# Patient Record
Sex: Female | Born: 1940 | Race: Asian | Hispanic: No | Marital: Married | Smoking: Never smoker
Health system: Southern US, Community
[De-identification: ages and names within clinical notes are randomized; demographics above are authoritative.]

## PROBLEM LIST (undated history)

## (undated) DIAGNOSIS — I2510 Atherosclerotic heart disease of native coronary artery without angina pectoris: Secondary | ICD-10-CM

## (undated) DIAGNOSIS — H3530 Unspecified macular degeneration: Secondary | ICD-10-CM

## (undated) DIAGNOSIS — M1990 Unspecified osteoarthritis, unspecified site: Secondary | ICD-10-CM

## (undated) DIAGNOSIS — E785 Hyperlipidemia, unspecified: Secondary | ICD-10-CM

## (undated) DIAGNOSIS — K559 Vascular disorder of intestine, unspecified: Secondary | ICD-10-CM

## (undated) DIAGNOSIS — I639 Cerebral infarction, unspecified: Secondary | ICD-10-CM

## (undated) DIAGNOSIS — E039 Hypothyroidism, unspecified: Secondary | ICD-10-CM

## (undated) DIAGNOSIS — I6529 Occlusion and stenosis of unspecified carotid artery: Secondary | ICD-10-CM

## (undated) HISTORY — DX: Occlusion and stenosis of unspecified carotid artery: I65.29

## (undated) HISTORY — PX: CORONARY ANGIOPLASTY: SHX604

## (undated) HISTORY — PX: CATARACT EXTRACTION: SUR2

## (undated) HISTORY — DX: Atherosclerotic heart disease of native coronary artery without angina pectoris: I25.10

## (undated) HISTORY — DX: Hyperlipidemia, unspecified: E78.5

## (undated) HISTORY — PX: CORONARY STENT PLACEMENT: SHX1402

---

## 1998-01-08 ENCOUNTER — Ambulatory Visit (HOSPITAL_COMMUNITY): Admission: RE | Admit: 1998-01-08 | Discharge: 1998-01-08 | Payer: Self-pay | Admitting: *Deleted

## 2005-03-20 ENCOUNTER — Encounter: Admission: RE | Admit: 2005-03-20 | Discharge: 2005-03-20 | Payer: Self-pay | Admitting: Internal Medicine

## 2005-03-30 ENCOUNTER — Ambulatory Visit (HOSPITAL_COMMUNITY): Admission: RE | Admit: 2005-03-30 | Discharge: 2005-03-31 | Payer: Self-pay | Admitting: Cardiology

## 2005-04-14 ENCOUNTER — Ambulatory Visit (HOSPITAL_COMMUNITY): Admission: RE | Admit: 2005-04-14 | Discharge: 2005-04-15 | Payer: Self-pay | Admitting: Cardiology

## 2005-11-10 ENCOUNTER — Ambulatory Visit (HOSPITAL_COMMUNITY): Admission: RE | Admit: 2005-11-10 | Discharge: 2005-11-10 | Payer: Self-pay | Admitting: *Deleted

## 2006-04-05 ENCOUNTER — Encounter: Admission: RE | Admit: 2006-04-05 | Discharge: 2006-04-05 | Payer: Self-pay | Admitting: Internal Medicine

## 2006-04-15 ENCOUNTER — Encounter: Admission: RE | Admit: 2006-04-15 | Discharge: 2006-04-15 | Payer: Self-pay | Admitting: Internal Medicine

## 2006-08-05 ENCOUNTER — Encounter: Admission: RE | Admit: 2006-08-05 | Discharge: 2006-08-05 | Payer: Self-pay | Admitting: Internal Medicine

## 2006-10-25 ENCOUNTER — Ambulatory Visit: Payer: Self-pay | Admitting: Vascular Surgery

## 2007-06-02 ENCOUNTER — Encounter: Admission: RE | Admit: 2007-06-02 | Discharge: 2007-06-02 | Payer: Self-pay | Admitting: Internal Medicine

## 2007-06-29 ENCOUNTER — Encounter: Admission: RE | Admit: 2007-06-29 | Discharge: 2007-06-29 | Payer: Self-pay | Admitting: Internal Medicine

## 2007-11-11 ENCOUNTER — Ambulatory Visit: Payer: Self-pay | Admitting: Vascular Surgery

## 2008-06-15 ENCOUNTER — Encounter: Admission: RE | Admit: 2008-06-15 | Discharge: 2008-06-15 | Payer: Self-pay | Admitting: Internal Medicine

## 2008-06-28 ENCOUNTER — Encounter: Admission: RE | Admit: 2008-06-28 | Discharge: 2008-06-28 | Payer: Self-pay | Admitting: Cardiology

## 2008-07-03 ENCOUNTER — Inpatient Hospital Stay (HOSPITAL_COMMUNITY): Admission: RE | Admit: 2008-07-03 | Discharge: 2008-07-04 | Payer: Self-pay | Admitting: Cardiology

## 2008-07-03 HISTORY — PX: FEMORAL ARTERY REPAIR: SHX1582

## 2008-11-02 ENCOUNTER — Ambulatory Visit: Payer: Self-pay | Admitting: Vascular Surgery

## 2009-06-27 ENCOUNTER — Encounter: Admission: RE | Admit: 2009-06-27 | Discharge: 2009-06-27 | Payer: Self-pay | Admitting: Internal Medicine

## 2009-10-26 IMAGING — CR DG CHEST 2V
2 series · 2 of 2 positions shown · non-contrast
Comparison: Chest x-ray of 03/30/2005

CLINICAL DATA: Chest pain, pre-procedure

CHEST - 2 VIEW

[w chest pa]
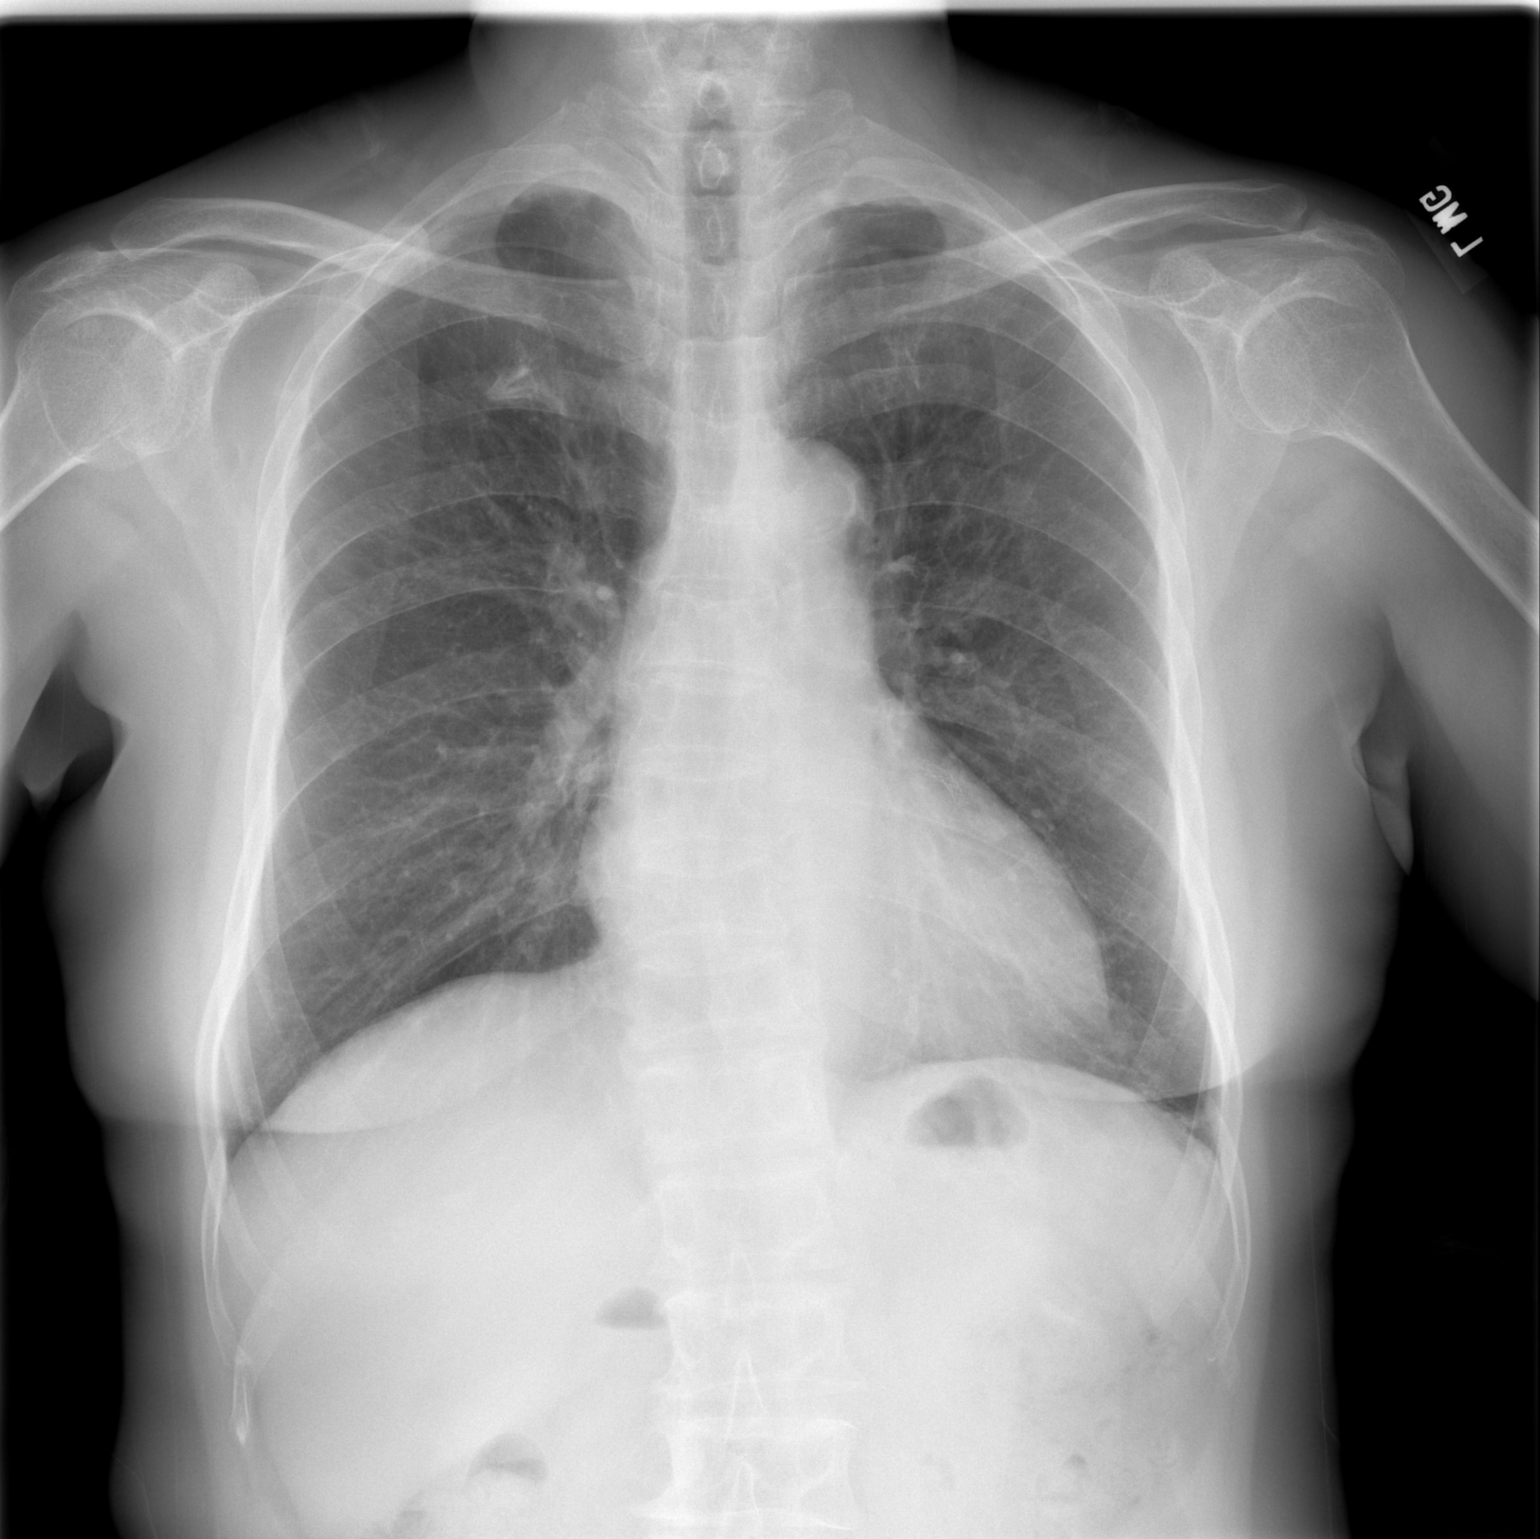

[w chest lat]
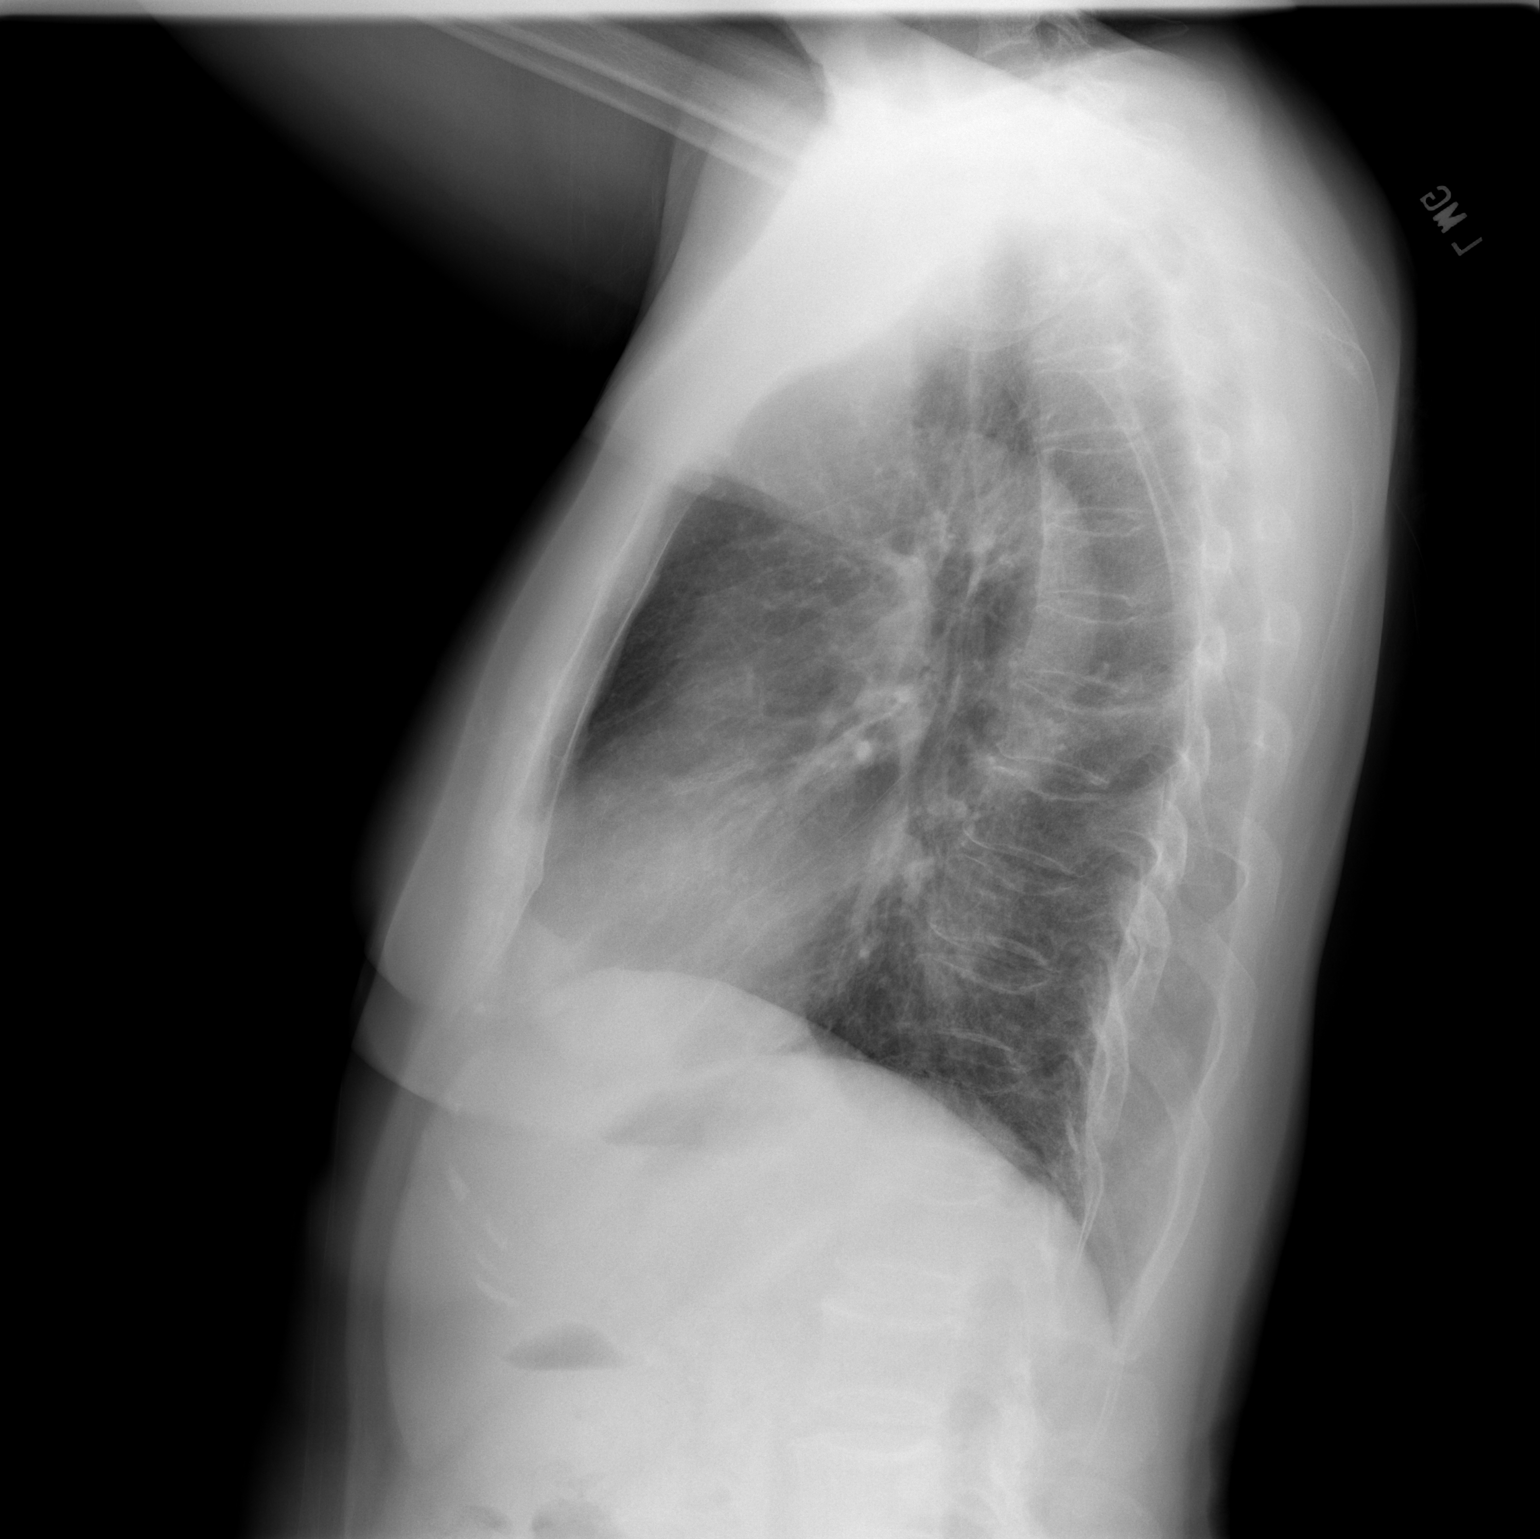

[2 of 2 positions shown; findings below may reference images not displayed]

FINDINGS: The lungs are clear. The heart is within upper limits of
normal.  The bones are somewhat osteopenic.
IMPRESSION: No active lung disease. Stable chest x-ray.,

## 2009-12-26 ENCOUNTER — Encounter: Payer: Self-pay | Admitting: Cardiovascular Disease

## 2010-01-31 ENCOUNTER — Ambulatory Visit: Payer: Self-pay | Admitting: Vascular Surgery

## 2010-03-10 ENCOUNTER — Ambulatory Visit: Payer: Self-pay | Admitting: Cardiovascular Disease

## 2010-03-10 DIAGNOSIS — I2510 Atherosclerotic heart disease of native coronary artery without angina pectoris: Secondary | ICD-10-CM

## 2010-03-10 DIAGNOSIS — E7800 Pure hypercholesterolemia, unspecified: Secondary | ICD-10-CM | POA: Insufficient documentation

## 2010-03-20 DIAGNOSIS — I10 Essential (primary) hypertension: Secondary | ICD-10-CM | POA: Insufficient documentation

## 2010-04-25 ENCOUNTER — Encounter: Payer: Self-pay | Admitting: Cardiovascular Disease

## 2010-07-02 ENCOUNTER — Encounter: Admission: RE | Admit: 2010-07-02 | Discharge: 2010-07-02 | Payer: Self-pay | Admitting: Internal Medicine

## 2010-09-16 NOTE — Assessment & Plan Note (Signed)
Summary: NP6- HTN/PVD   Visit Type:  Initial Consult Primary Provider:  Juline Patch  CC:  HTN- PVD.  History of Present Illness: 70 year-old [place] woman with CAD, previously followed by Dr Jacinto Halim, presents for initial evaluation of her chronic coronary disease.   She has been treated with PCI of the LAD and RCA in the past, most recently in 2009 when she underwent scoring balloon angioplasty of in-stent restenosis in the RCA.  She is doing well at present without complaints. She exercises about 60 minutes daily and has no exertional symptoms.  She specifically denies chest pain, dyspnea, edema, palpitations, lightheadedness, or syncope. Whe reports compliance with her current medical program.    Current Medications (verified): 1)  Enalapril Maleate 20 Mg Tabs (Enalapril Maleate) .... Take One Tablet By Mouth Twice A Day 2)  Lipitor 20 Mg Tabs (Atorvastatin Calcium) .... Take One Tablet By Mouth Daily. 3)  Plavix 75 Mg Tabs (Clopidogrel Bisulfate) .... Take One Tablet By Mouth Daily 4)  Metoprolol Succinate 50 Mg Xr24h-Tab (Metoprolol Succinate) .... Take One Tablet By Mouth Daily 5)  Amlodipine Besylate 5 Mg Tabs (Amlodipine Besylate) .... Take One Tablet By Mouth Daily 6)  Aspirin 81 Mg Tbec (Aspirin) .... Take 1 Tablet By Mouth Two Times A Day 7)  Multivitamins  Tabs (Multiple Vitamin) .... Take 1 Tablet By Mouth Once A Day 8)  Vitamin C 1000 Mg Tabs (Ascorbic Acid) .... Take 1 Tablet By Mouth Once A Day 9)  Fish Oil 1000 Mg Caps (Omega-3 Fatty Acids) .... Take 1 Capsule By Mouth Once A Day 10)  Vitamin D 1000 Unit Tabs (Cholecalciferol) .... Take 1 Tablet By Mouth Once A Day 11)  Niacin 500 Mg Tabs (Niacin) .... Take 1 Tablet By Mouth Once A Day 12)  Plavix 75 Mg Tabs (Clopidogrel Bisulfate) .... Take One Tablet By Mouth Daily  Allergies: 1)  ! Penicillin 2)  ! Sulfa  Past History:  Past medical history reviewed for relevance to current acute and chronic problems.  Past  Medical History: CAD s/p PCI - LAD and RCA with drug-eluting stents, PTCA in 2009 to treat in-stent restenosis in the RCA Essential HTN Hyperlipidemia Asymptomatic carotid stenosis  Past Surgical History: None  Family History: No premature CAD in family members. Both parents deceased at age 31, 4 siblings alive without coronary events.  Social History: Married, originally from [place] Nonsmoker No Etoh Regular exercise  Review of Systems       Negative except as per HPI   Vital Signs:  Patient profile:   70 year old female Height:      63 inches Weight:      140 pounds BMI:     24.89 Pulse rate:   58 / minute Pulse rhythm:   regular Resp:     18 per minute BP sitting:   140 / 80  (left arm) Cuff size:   large  Vitals Entered By: Vikki Ports (March 10, 2010 9:17 AM)  Serial Vital Signs/Assessments:  Time      Position  BP       Pulse  Resp  Temp     By           R Arm     138/70                         Vikki Ports   Physical Exam  General:  Pt is well-developed, alert and oriented, no acute  distress HEENT: normal Neck: no thyromegaly           JVP normal, carotid upstrokes normal without bruits Lungs: CTA Chest: equal expansion  CV: Apical impulse nondisplaced, RRR without murmur or gallop Abd: soft, NT, positive BS, no HSM, no bruit Back: no CVA tenderness Ext: no clubbing, cyanosis, or edema        femoral pulses 2+ without bruits        pedal pulses 2+ and equal Skin: warm, dry, no rash Neuro: CNII-XII intact,strength 5/5 = b/l    EKG  Procedure date:  03/10/2010  Findings:      Sinus bradycardia 58 bpm, early repolarization, LVH.  Impression & Recommendations:  Problem # 1:  CORONARY ATHEROSCLEROSIS NATIVE CORONARY ARTERY (ICD-414.01) The pt appears to be asymptomatic from a cardiovascular perspective with good control of her risk factors under the care of Dr Selena Batten. She has had angina in the past associated with her obstructive CAD and is free  of such symptoms at present. Recommend continue current medical program without changes. I would favor long-term dual dual antiplatelet Rx in the setting of first-generation drug-eluting stents in both the LAD and RCA with hx of in-stent restenosis requiring treatment.    Her updated medication list for this problem includes:    Enalapril Maleate 20 Mg Tabs (Enalapril maleate) .Marland Kitchen... Take one tablet by mouth twice a day    Plavix 75 Mg Tabs (Clopidogrel bisulfate) .Marland Kitchen... Take one tablet by mouth daily    Metoprolol Succinate 50 Mg Xr24h-tab (Metoprolol succinate) .Marland Kitchen... Take one tablet by mouth daily    Amlodipine Besylate 5 Mg Tabs (Amlodipine besylate) .Marland Kitchen... Take one tablet by mouth daily    Aspirin 81 Mg Tbec (Aspirin) .Marland Kitchen... Take 1 tablet by mouth two times a day  Orders: EKG w/ Interpretation (93000)  Problem # 2:  HYPERCHOLESTEROLEMIA, MIXED (ICD-272.0) Lipids reviewed from 12/23/09 showing chol 137, trig 171, HDL 30, and LDL 73. Continue current Rx. LFT's were normal.  Her updated medication list for this problem includes:    Lipitor 20 Mg Tabs (Atorvastatin calcium) .Marland Kitchen... Take one tablet by mouth daily.    Niacin 500 Mg Tabs (Niacin) .Marland Kitchen... Take 1 tablet by mouth once a day  Problem # 3:  HYPERTENSION, BENIGN (ICD-401.1)  Controlled.   Her updated medication list for this problem includes:    Enalapril Maleate 20 Mg Tabs (Enalapril maleate) .Marland Kitchen... Take one tablet by mouth twice a day    Metoprolol Succinate 50 Mg Xr24h-tab (Metoprolol succinate) .Marland Kitchen... Take one tablet by mouth daily    Amlodipine Besylate 5 Mg Tabs (Amlodipine besylate) .Marland Kitchen... Take one tablet by mouth daily    Aspirin 81 Mg Tbec (Aspirin) .Marland Kitchen... Take 1 tablet by mouth two times a day  BP today: 140/80  Patient Instructions: 1)  Your physician recommends that you continue on your current medications as directed. Please refer to the Current Medication list given to you today. 2)  Your physician wants you to follow-up in:  1 year.  You will receive a reminder letter in the mail two months in advance. If you don't receive a letter, please call our office to schedule the follow-up appointment.

## 2010-09-16 NOTE — Consult Note (Signed)
Summary: [facility] Imaging Referral [facility] Imaging Referral Packet   Imported By: Roderic Ovens 03/25/2010 14:14:50  _____________________________________________________________________  External Attachment:    Type:   Image     Comment:   External Document

## 2010-12-30 NOTE — Procedures (Signed)
CAROTID DUPLEX EXAM   INDICATION:  Followup carotid artery disease.   HISTORY:  Diabetes:  No.  Cardiac:  Stents x2.  Hypertension:  Yes.  Smoking:  No.  Previous Surgery:  No.  CV History:  No.  Amaurosis Fugax No, Paresthesias No, Hemiparesis No                                       RIGHT               LEFT  Brachial systolic pressure:         132                 140  Brachial Doppler waveforms:         WNL                 WNL  Vertebral direction of flow:        Antegrade           Antegrade  DUPLEX VELOCITIES (cm/sec)  CCA peak systolic                   90                  87  ECA peak systolic                   181                 151  ICA peak systolic                   192                 104  ICA end diastolic                   56                  40  PLAQUE MORPHOLOGY:                  Calcified/mixed     Calcified/mixed  PLAQUE AMOUNT:                      Moderate            Mild  PLAQUE LOCATION:                    ICA/ECA/bifurcation  ICA/ECA/bifurcation   IMPRESSION:  1. Right ICA shows evidence of 40-59% stenosis (top end of range).  2. Left ICA shows evidence of 20-39% stenosis.  3. Bilateral ECA stenosis (R>L).  4. No significant changes from previous study.   ___________________________________________  Larina Earthly, M.D.   AS/MEDQ  D:  11/02/2008  T:  11/02/2008  Job:  [id number]

## 2010-12-30 NOTE — Procedures (Signed)
CAROTID DUPLEX EXAM   INDICATION:  Follow up carotid artery disease.   HISTORY:  Diabetes:  No.  Cardiac:  Stents x2.  Hypertension:  Yes.  Smoking:  No.  Previous Surgery:  No.  CV History:  No.  Amaurosis Fugax No, Paresthesias No, Hemiparesis No.                                       RIGHT             LEFT  Brachial systolic pressure:         180               172  Brachial Doppler waveforms:         WNL               WNL  Vertebral direction of flow:        Antegrade         Antegrade  DUPLEX VELOCITIES (cm/sec)  CCA peak systolic                   108               131  ECA peak systolic                   158               199  ICA peak systolic                   186               132  ICA end diastolic                   34                28  PLAQUE MORPHOLOGY:                  Heterogenous      Heterogenous  PLAQUE AMOUNT:                      Mild              Mild  PLAQUE LOCATION:                    ICA/ECA           ICA/ECA   IMPRESSION:  1. Right internal carotid artery suggests 40% to 59% stenosis (top end      of range).  2. Left internal carotid artery suggests 40% to 59% stenosis (low end      of range).  3. Bilateral external carotid artery stenosis.  4. Antegrade flow in bilateral vertebrals.   ___________________________________________  Larina Earthly, M.D.   CB/MEDQ  D:  01/31/2010  T:  01/31/2010  Job:  [id number]

## 2010-12-30 NOTE — Cardiovascular Report (Signed)
Jennifer Russo, Jennifer Russo NO.:  [id number]   MEDICAL RECORD NO.:  [id number]          PATIENT TYPE:  INP   LOCATION:  6526                         FACILITY:  [facility]   PHYSICIAN:  Cristy Hilts. Jacinto Halim, MD       DATE OF BIRTH:  1941/03/16   DATE OF PROCEDURE:  07/03/2008  DATE OF DISCHARGE:                            CARDIAC CATHETERIZATION   PROCEDURES PERFORMED:  1. Left ventriculography.  2. Selective right and left coronary arteriography.  3. Percutaneous transluminal coronary angioplasty and scoring balloon      angioplasty of the mid right coronary artery in-stent restenosis.   HISTORY:  Ms. Jennifer Russo is a 70 year old Asian female with history of  known coronary disease.  She has undergone stenting to a mid-LAD for non-  ST-elevation myocardial infarction on March 30, 2005, with implantation  of a 3.0-mm x 23-mm Cypher stent in the mid LAD, and she has a stent  jailed 90% diagonal 1 disease.  She has an occluded first obtuse  marginal branch of the circumflex coronary artery, which has both  ipsilateral and contralateral collaterals in 2006.  She also has stent  implantation to the mid RCA, which is a 3.0-mm x 28-mm Cypher stent  placed on April 14, 2005.  She has been having exertional angina  pectoris recently and has inferolateral ST-segment changes in the EKG.  Given this, she was directly brought to the cardiac catheterization lab  to reevaluate her coronary anatomy.   HEMODYNAMIC DATA:  The ventricular pressure was 137/2, with an end-  diastolic pressure of 7 mmHg.  The aortic pressure was 135/58 with a  mean of 88 mmHg.  There was no pressure gradient across the aortic  valve.   ANGIOGRAPHIC DATA:  Left ventricle:  Left ventricular systolic function  was normal with ejection fraction of 60%.  There was no significant  mitral regurgitation.   Right coronary artery:  Right coronary artery is a large-caliber  dominant vessel.  Gives origin to large PLA and  large PDA.  The  previously placed stent shows a focal 99% in-stent restenosis.   Left main coronary artery:  Left main coronary artery is a large-caliber  vessel, it is smooth and normal.   LAD:  LAD is a large caliber vessel.  The previously placed mid-LAD  Cypher stent is widely patent.  The stent jailed diagonal still persist  to have ostial 85-90% stenosis, which is unchanged from 2006.  There was  excellent flow noted through this vessel.  There is mild poststenotic  ectasia.   Ramus intermediate:  Ramus intermediate is a moderate caliber vessel.  It has mild luminal irregularity in the proximal-to-mid segment.   Circumflex coronary artery:  Circumflex coronary artery is a large  caliber vessel.  Gives origin to a large first obtuse marginal, which is  occluded, but has ipsilateral collaterals.  This occlusion is unchanged  from prior cardiac catheterization.   INTERVENTION DATA:  Successful PTCA and scoring balloon angioplasty of  ISR in the mid RCA with utilization of a 3.0-mm x 10 mm AngioSculpt  scoring  balloon with reduction of stenosis from 99% to 0% with brisk  TIMI 3 to TIMI 3 flow maintained at the end of the procedure.   Right femoral arteriography was performed and arterial access was closed  with [employer] with excellent hemostasis.   RECOMMENDATIONS:  The patient will be discharged home in the morning.  She will continue on aspirin and Plavix.   A total of 115 mL of contrast was utilized for diagnostic and  interventional procedure.   TECHNIQUE OF THE PROCEDURE:  Under usual sterile precautions using a 6-  French right femoral artery access, 6-French multipurpose B2 catheter  was advanced into the ascending aorta and then into the left  ventricular, left ventriculography was performed both in LAO and RAO  projection.  Catheter pulled into the ascending aorta.  Right coronary  was selective engaged, angiography was performed, then left main  coronary artery  was selectively engaged and angiography was performed.  Catheter was then pulled out of the body.   Using Angiomax for anticoagulation, a 7-French FR-4 with side-hole guide  catheter was utilized to engage the right coronary artery.  Using [employer], I was able to cross the lesion and place the 3.0-mm x 10-mm  AngioSculpt balloon with difficulty into the mid RCA and multiple  balloon inflations were performed.  Intracoronary 200 mcg of  nitroglycerin was also administered and angiography was performed.  Excellent results were noted.  A total of three inflations were  performed and then the balloon was withdrawn, angiography repeated,  guide wire withdrawn, angiography repeated, guide catheter disengaged  and pulled out of the body.   The patient tolerated the procedure well.  No immediate complication  noted.      Cristy Hilts. Jacinto Halim, MD  Electronically Signed     JRG/MEDQ  D:  07/03/2008  T:  07/04/2008  Job:  [id number]   cc:   Juline Patch, M.D.

## 2010-12-30 NOTE — Discharge Summary (Signed)
Jennifer Russo, MCCLEERY NO.:  [id number]   MEDICAL RECORD NO.:  [id number]          PATIENT TYPE:  INP   LOCATION:  6526                         FACILITY:  [facility]   PHYSICIAN:  Cristy Hilts. Jacinto Halim, MD       DATE OF BIRTH:  [birth year]   DATE OF ADMISSION:  07/03/2008  DATE OF DISCHARGE:  07/04/2008                               DISCHARGE SUMMARY   DISCHARGE DIAGNOSES:  1. Recurrent progressive angina.  2. Previous coronary artery disease.  3. Status post cardiac catheterization this admission with in-stent      restenosis of an right coronary artery stent at 99% with successful      procedure, percutaneous transluminal coronary angioplasty with      angioscope Scoring balloon 3.0 x 10, reduced from 99-0%.  Positive      residual disease with a 100% first obtuse marginal with collaterals      from circumflex and ramus to the first obtuse marginal and 90%      diagonal-1, ostial.  A 3.0 x 23 Cypher stent in her left anterior      descending is patent, placed on March 30, 2005.  4. Ejection fraction of 60%.  5. Hyperlipidemia.  6. Hypertension.  7. History of cerebrovascular accident in 2001 with no residual      effect.   LABORATORY DATA:  Sodium 140, potassium 4.1, BUN 9, creatinine 0.85,  glucose 100.  Hemoglobin 13.4, hematocrit 38.8, WBCs 8.6, and platelets  232.  CK-MB and the troponins were negative.  Chest x-ray on June 28, 2008, showed no active lung disease with stable chest x-ray.   Discharge medications are the same as prior to admission:  1. Enalapril 20 mg twice per day.  2. Lipitor 20 mg daily.  3. Amlodipine 10 mg a day.  4. Plavix 75 mg a day.  5. Aspirin 81 mg a day.  6. Multivitamin 1 everyday.  7. Vitamin C 1000 mg everyday.  8. Fish oil 1000 mg everyday.  9. Vitamin D 1000 International Units everyday.  10.Metoprolol succinate 50 mg everyday.  11.Alendronate 70 mg a week.  12.Calcium plus D 500 mg daily.  13.She was given a prescription  for nitroglycerin spray that she can      use p.r.n. for angina.   HOSPITAL COURSE:  [name] is a 70 year old [place] female who was  referred by Dr. Juline Patch to Dr. Jacinto Halim secondary to angina and change  in her EKG.  She does have a previous history of coronary artery  disease.  She was seen by Dr. Jacinto Halim.  Her blood pressure was rather  elevated.  He increased her amlodipine and discussed with her cardiac  catheterization.  She decided to proceed.  She was brought into the  hospital on July 03, 2008, for cardiac cath.  This revealed a 99% in-  stent restenosis of her RCA stent.  She underwent successful PTCA and  angioscope Scoring balloon reduced from 99-0%.  Postprocedure, she was  doing well.  She walked in the hall the following day and blood pressure  was 105/38, heart rate was 52, respirations 18, temperature was 97.8.  She was considered stable for discharge home.   DISCHARGE INSTRUCTIONS:  She will follow up with Dr. Jacinto Halim on July 25, 2008, at 10:45.  She is to do no strenuous activity, lifting,  pushing, pulling, or extended walking x1 week.  She should not drive for  1 day, and she should be on a low-sodium, heart-healthy diet.      [name], N.P.      Cristy Hilts. Jacinto Halim, MD  Electronically Signed    BB/MEDQ  D:  07/04/2008  T:  07/04/2008  Job:  [id number]   cc:   Juline Patch, M.D.

## 2010-12-30 NOTE — Procedures (Signed)
CAROTID DUPLEX EXAM   INDICATION:  Follow up carotid artery disease.   HISTORY:  Diabetes:  No.  Cardiac:  Stent x2.  Hypertension:  Yes.  Smoking:  No.  Previous Surgery:  No.  CV History:  Asymptomatic.  Amaurosis Fugax No, Paresthesias No, Hemiparesis No                                       RIGHT             LEFT  Brachial systolic pressure:         160               162  Brachial Doppler waveforms:         Triphasic         Triphasic  Vertebral direction of flow:        Antegrade         Antegrade  DUPLEX VELOCITIES (cm/sec)  CCA peak systolic                   102               88  ECA peak systolic                   207               186  ICA peak systolic                   199               95  ICA end diastolic                   50                32  PLAQUE MORPHOLOGY:                  Calcified         Calcified  PLAQUE AMOUNT:                      Moderate          Mild  PLAQUE LOCATION:                    ICA/ECA           ICA/ECA   IMPRESSION:  1. Right ICA stenosis of 40-59% (top end of range), showing no      significant velocity change from previous study.  However, in lower      category than previous study due to criteria difference.  2. Left ICA stenosis of 20-39%.  3. Bilateral ECA stenosis.   ___________________________________________  Larina Earthly, M.D.   AS/MEDQ  D:  11/11/2007  T:  11/11/2007  Job:  [id number]

## 2011-01-02 NOTE — Cardiovascular Report (Signed)
[name], [name] NO.:  [id number]   MEDICAL RECORD NO.:  [id number]          PATIENT TYPE:  OIB   LOCATION:  2899                         FACILITY:  [facility]   PHYSICIAN:  Jennifer Hilts. Jacinto Halim, MD       DATE OF BIRTH:  [birth year]   DATE OF PROCEDURE:  03/30/2005  DATE OF DISCHARGE:                              CARDIAC CATHETERIZATION   REFERRING PHYSICIAN:  Juline Russo, M.D.   PROCEDURE PERFORMED:  1.  Left ventriculography.  2.  Selective coronary angiography.  3.  Percutaneous transluminal coronary angioplasty and stenting of the mid      left anterior descending.   INDICATIONS:  Ms. Jennifer Russo is a 70 year old female with history of known  coronary artery disease by cardiac catheterization in [place] a few months ago  and has had ongoing exertional chest discomfort. She underwent an exercise  treadmill stress test which was strongly positive for myocardial ischemia.  Hence, she was referred to me for diagnostic angiography and possible  angioplasty.   She also has history of stroke in the past.   HEMODYNAMIC DATA:  The left ventricular pressures were 134/9 with an end-  diastolic pressure of 20 mmHg. The aortic pressures were 133/53 with a mean  of 85 mmHg. There was no appreciable gradient across the aortic valve.   ANGIOGRAPHIC DATA:  Left ventricle: Left ventricular systolic function was  normal. The ejection fraction was estimated at 65%. There was no significant  mitral regurgitation.  Right coronary artery: The right coronary artery is a large caliber vessel,  dominant vessel. It give rise to two large PDAs and continues as a PLA  branch.  It has got a calcific focal 70% stenosis in the mid segment, and at  the crux there is a 60% stenosis. Otherwise, the RCA appears smooth.  Left main: The left main is large caliber vessel. It is not patent.  Left anterior descending: The LAD is a large caliber vessel. It gives rise  to a very small first diagonal and  a large second diagonal. After  bifurcation of the second diagonal, the LAD had a 99% complex calcific  stenosis. Prior to this diagonal bifurcation, there was a 80% to 90%  calcific stenosis.  Circumflex: The circumflex is a large caliber vessel. It continues as a  tortuous vessel in its distal segment. However, in the mid segment it gives  rise to a moderate size first obtuse marginal which is completely occluded.  However, it has bridging collaterals and the distal bed is well visualized.   IMPRESSION:  High-grade 99% stenosis of the mid left anterior descending  which is very complex at the bifurcation of a large second diagonal. A 100%  occluded first obtuse marginal which has bridging collaterals and the distal  vessel appears to be at least moderate size. There is a calcific 70% to 80%  mid RCA stenosis and a tandem 60% calcific stenosis in the mid RCA.  Preserved LV systolic function with an ejection fraction greater than 60%.   INTERVENTION DATA:  Successful PTCA and stenting of the  mid LAD with a 2.0 x  23-mm CYPHER deployed at 10 atmospheric pressure. The stenosis was reduced  from 99% to less than 20% with a waist at the tightness lesion D1  bifurcation. This site was post dilated with a  3.0 x 12-mm Quantum at 16  atmospheric pressure. Overall, the stenosis was reduced from 99% to 0% with  TIMI-III to TIMI-III flow maintained at the end of the procedure.   RECOMMENDATIONS:  The patient will be brought back for elective angioplasty  of the right and also possibly at the first obtuse marginal  of the  circumflex coronary artery which is completely occluded. This will be  performed in about two weeks.   The patient will be continued on Plavix for a period of  at least three to  six months and probably indefinitely if she tolerates it. The patient has a  questionable history of increased LFTs with Plavix long-term when she was  started on for remote stroke. Hence the LFTs will  be monitored every two  weeks at least three to four times, then every four weeks for at least six  months. I discussed these plans with Dr. Juline Russo.   A total of 250 cc of contrast was utilized for diagnostic and interventional  procedure.   TECHNIQUE OF PROCEDURE:  Diagnostic catheterization.  Under the usual  sterile precautions, using a 6 French right femoral arterial access, a 6  [place] multipurpose B2 catheter was advanced into the ascending aorta with  0.025 inch J wire. The catheter was gently advanced into the left ventricle.  The left ventricular pressure was monitored.  Hand contrast into left  ventricle was performed in the LAO and RAO projection. The catheter was  flushed with saline, pulled back in the ascending aorta, and pressure  gradient across the aortic valve was monitored. The right coronary artery  was selectively engaged and angiography was performed. In a similar fashion,  the left main coronary artery was selectively engaged and angiography was  performed. Then the catheter was pulled back outside of the body in the  usual fashion.   TECHNIQUE OF INTERVENTION:  A 6-French sheath was changed to a 7-French  sheath. Then using a 7-French FL 3.5 guide, a 190 cm by  0.014 inch ATW  guide wire was utilized and the tip of the wire was carefully positioned in  the second diagonal of the LAD.  Then initially I tried to use an Publishing rights manager to go into the LAD, but I was unable to. Hence, I used a Market researcher. Again, I was unable to cross into the LAD. Then, using Crossit 100, I  was able to go into the LAD and the tip of the wire was carefully positioned  in the distal LAD. Then a 2.5 x 20-mm Voyager balloon was utilized and the  mid LAD angioplasty at 10 atmospheric pressure for 63 seconds was performed.  Because of his severe stenosis and heavy calcification it was decided to proceed with stenting. Hence a 3.0 x 23-mm CYPHER stent was advanced in this  site  and deployed at 10 atmospheric pressure. Because of the waste at the  second diagonal bifurcation, the stent was post dilated at 3.0 x 12-mm  Quantum at 16 atm pressure for 42 seconds. Repeat angiography had excellent  results. The guide wire was placed in the second diagonal branch for  protection during stenting. This easily was removed post stenting. Prior to  balloon dilatation the  wire was re-advanced back into the second diagonal to  again protect the second diagonal which is a large vessel. Overall,  excellent results were noted. The patient tolerated the procedure well. No  immediate complications were noted.      Jennifer Hilts. Jacinto Halim, MD  Electronically Signed     JRG/MEDQ  D:  03/30/2005  T:  03/30/2005  Job:  [id number]   cc:   Jennifer Russo, M.D.  [address]  [place], [place] [address]  Fax: [phone]

## 2011-01-02 NOTE — Discharge Summary (Signed)
Jennifer Russo, DILLIN NO.:  [id number]   MEDICAL RECORD NO.:  [id number]          PATIENT TYPE:  OIB   LOCATION:  6529                         FACILITY:  [facility]   PHYSICIAN:  Cristy Hilts. Jacinto Halim, MD       DATE OF BIRTH:  [birth year]   DATE OF ADMISSION:  04/14/2005  DATE OF DISCHARGE:  04/15/2005                                 DISCHARGE SUMMARY   DISCHARGE DIAGNOSES:  1.  Coronary artery disease, status post prior intervention, in failure this      month.  2.  Hypertension.  3.  Hyperlipidemia.  4.  History of stroke.   HISTORY OF PRESENT ILLNESS:  [name], a patient  of Dr. Vonna Kotyk R. Ganji, who was seen by him in the office on April 07, 2005.  She underwent prior a coronary angiography that revealed multi-vessel  disease and Dr. Jacinto Halim performed a successful angiography to the mid-LAD with  the insertion of a Cypher stent on March 30, 2005.  The patient still had a  residual 100% occluded marginal-I and a 70%-80% mid and 60% calcific  stenosis of the RCA.  She was scheduled for a PCI of the RCA on April 14, 2005.   HOSPITAL COURSE:  The patient was admitted to the short-stay unit for the  procedure and underwent it on the same day.  Coronary angiography was  performed by Dr. Jacinto Halim, and he performed a direct stenting of the RCA with a  reduction of the proximal lesion from 80% to 0%, and the distal lesion from  60%-70% to 0%.  The patient tolerated the procedure well.  He attempted to  cross the obtuse marginal, but was unable to.   The patient was observed in the unit and she did not have any signs of  hematoma or bleeding from the groin and was discharged home in a stable  condition.   LABORATORY DATA:  Post-catheterization CBC revealed white blood cells 9.2,  hemoglobin 12.1, hematocrit 35.3, platelet count 317.  Sodium 140, potassium  3.9, CO2 of 27, chloride 110, glucose 107, BUN 12, creatinine 0.9.   The patient was seen by Dr.  Nanetta Batty the next morning after  catheterization and was found to be stable for discharge home.   DISCHARGE MEDICATIONS:  1.  Verapamil 240 mg daily.  2.  Aspirin 81 mg daily.  3.  Plavix 75 mg daily.  4.  Vasotec 20 mg daily.  5.  Imdur 30 mg daily.  6.  Lopressor 25 mg b.i.d.  7.  Fish oil 1000 mg daily.  8.  Vitamin C 500 mg daily.  9.  Multivitamin daily.  10. Lipitor 40 mg daily.  11. Glaxo study drug daily.   DIET:  A low-fat, low-cholesterol, low-salt diet.   ACTIVITY:  No driving, no lifting of greater than 5 pounds for three days  post-catheterization.  No strenuous activities.   FOLLOWUP:  She is to see Dr. Jacinto Halim in our office on May 01, 2005, at  10 a.m.      Jennifer  Carolyne Russo.      Cristy Hilts. Jacinto Halim, MD  Electronically Signed    MK/MEDQ  D:  04/15/2005  T:  04/15/2005  Job:  [id number]   cc:   [facility]

## 2011-01-02 NOTE — Cardiovascular Report (Signed)
NAMELARETA, BRUNEAU NO.:  [id number]   MEDICAL RECORD NO.:  [id number]          PATIENT TYPE:  OIB   LOCATION:  2857                         FACILITY:  [facility]   PHYSICIAN:  Cristy Hilts. Jacinto Halim, MD       DATE OF BIRTH:  Feb 16, 1941   DATE OF PROCEDURE:  04/14/2005  DATE OF DISCHARGE:                              CARDIAC CATHETERIZATION   PROCEDURE PERFORMED:  1.  Coronary arteriography.  2.  Percutaneous transluminal coronary angioplasty and direct stenting of      the mid right coronary artery with a 3 by 28 mm Cypher.  3.  Attempted percutaneous transluminal coronary angiography of the chronic      totally occluded first obtuse marginal.   INDICATIONS FOR PROCEDURE:  Ms. Jennifer Russo is a 70 year old [place] female  with a history of known coronary artery disease.  She had undergone coronary  angiography in [place] and was found to have two vessel disease.  I had cathed  her on March 30, 2005, with recurrent angina which was stable and, at that  time, she was found to have a 95% high grade complex mid LAD stenosis, 80%  RCA, and a 60-70% RCA stenosis tandemly in the mid segment and in the first  obtuse marginal which is chronically occluded.  She underwent successful  PTCA and stenting of a mid LAD with a 3 by 23 mm Cypher without any undue  complications.  Now, she is brought back for elective staged PCI of the RCA  and possible attempted obtuse marginal PTCA.   ANGIOGRAPHIC DATA:  Please note my angiographic data dated March 30, 2005.   Right coronary artery:  The right coronary artery is dominant and is a large  vessel.  It gives origin to a large PDA and much larger PLA.  The RCA in the  mid segment has a tandem 80% and a 60-70% calcific stenosis.   Left main:  The left main is large caliber vessel and appears normal.   Circumflex:  The circumflex is a large caliber vessel and is very tortuous  in its mid segment.  The mid segment also gives origin to a  moderate size  first obtuse marginal which is chronically occluded and has bridging  collaterals.   Left anterior descending:  The left anterior descending  is a large vessel.  It gives origin to small diagonal one and a large diagonal two.  The mid LAD  stent placed on March 30, 2005, is widely patent.   INTERVENTIONAL DATA:  Successful PTCA and direct stenting of the mid RCA  with a 3 by 28 mm Cypher deployed at 18 atmospheres pressure for one minute.  The stent was post dilated with 3.25 by 20 mm Quantum.  The stenosis was  reduced from 80% stenosis to 0% in the mid RCA and mid to distal RCA 60-70%  stenosis to 0%.  TIMI III flow at the end of the procedure.   Unsuccessful PTCA of the first obtuse marginal.  I was unable to cannulate  in spite of trying with a Cross-It  100 wire and also balloon support.  It  appeared very heavily calcified and is chronically occluded.   RECOMMENDATIONS:  The patient has excellent collaterals to obtuse marginal,  hence, continued medical therapy is advised.  The patient will be continued  on aggressive risk factor modification.  The patient will be discharged  early in the morning.  Her anginal symptoms will be re-evaluated.   A total of 165 mL of contrast was utilized for coronary angiography and  angioplasty.   TECHNIQUES OF PROCEDURE:  In the usual sterile technique, using a 6 French  right femoral artery access, using Angiomax for anticoagulation, a 6 [place]  JR4 guide was utilized to engage the right coronary artery.  A 190 cm by  0.014 ATW guide-wire was utilized to measure the lesion length.  Then, a 3  by 28 mm Cypher stent was advanced into the lesion and the stent was  deployed at peak 18 atmospheric pressure for 60 seconds.  Post stent  deployment, there was residual waste noted in the proximal and mid to distal  segment of the stent, hence, this was dilated with 3.25 by 20 mm Quantum  balloon at 12 atmospheres pressure.  The stenosis  was reduced from 80% to 0%  and 60-70% to 0% in the proximal and mid segment stents.  TIMI III flow was  maintained.  Then, attention was directed to the left system.   A 7 French JL3.5 guide was utilized to engage the left main coronary artery.  Then, using the same ATW guide-wire, I was able to cross into the proximal  segment of the CPO of the obtuse marginal.  However, because of inability to  completely cross the vessel, I buddy wired it with a Cross-It 100.  Then, I  used a 2 by 20 mm Maverick balloon for support and, in spite of this, I was  unable to cannulate the obtuse marginal.  Hence, the procedure was  abandoned.   The patient tolerated the procedure well.  No immediate complications were  noted.      Cristy Hilts. Jacinto Halim, MD  Electronically Signed     JRG/MEDQ  D:  04/14/2005  T:  04/14/2005  Job:  [id number]   cc:   Juline Patch, M.D.  [address]  [place], [place] [address]  Fax: [phone]

## 2011-01-02 NOTE — Op Note (Signed)
[name], Jennifer Russo NO.:  [id number]   MEDICAL RECORD NO.:  [id number]          PATIENT TYPE:  AMB   LOCATION:  ENDO                         FACILITY:  [facility]   PHYSICIAN:  Georgiana Spinner, M.D.    DATE OF BIRTH:  16-Feb-1941   DATE OF PROCEDURE:  11/10/2005  DATE OF DISCHARGE:                                 OPERATIVE REPORT   PROCEDURE:  Colonoscopy.   INDICATIONS:  Colon cancer screening.   ANESTHESIA:  Fentanyl 50 mcg, Versed 4 mg.   PROCEDURE:  With patient mildly sedated in the left lateral decubitus  position, the Olympus videoscopic colonoscope was inserted in the rectum and  with the patient rolled to her back and pressure applied, we reached the  cecum identified by ileocecal valve and appendiceal orifice, both which were  photographed.  Prep was somewhat suboptimal in that there was solid  vegetable material scattered throughout the colon that had to be washed and  suctioned as best we could.  From this point, colonoscope was slowly  withdrawn, taking circumferential views of the colonic mucosa washing and  suctioning as we withdrew, until we reached the rectum which appeared normal  on direct and showed hemorrhoids on retroflexed view.  The endoscope was  straightened and withdrawn.  The patient's vital signs and pulse oximeter  remained stable.  The patient tolerated procedure well without apparent  complication.   FINDINGS:  Internal hemorrhoids, scattered diverticulosis of the colon,  otherwise an unremarkable examination.   PLAN:  Have patient follow up with me as an outpatient.           ______________________________  Georgiana Spinner, M.D.     GMO/MEDQ  D:  11/10/2005  T:  11/11/2005  Job:  [id number]

## 2011-01-19 ENCOUNTER — Other Ambulatory Visit (INDEPENDENT_AMBULATORY_CARE_PROVIDER_SITE_OTHER): Payer: Medicare Other

## 2011-01-19 DIAGNOSIS — I6529 Occlusion and stenosis of unspecified carotid artery: Secondary | ICD-10-CM

## 2011-01-29 NOTE — Procedures (Unsigned)
CAROTID DUPLEX EXAM  INDICATION:  Followup carotid artery disease.  HISTORY: Diabetes:  No. Cardiac:  Stents x2. Hypertension:  Yes. Smoking:  No. Previous Surgery:  No. CV History:  No. Amaurosis Fugax No, Paresthesias No, Hemiparesis No                                      RIGHT             LEFT Brachial systolic pressure:         148               143 Brachial Doppler waveforms:         Normal            Normal Vertebral direction of flow:        Antegrade         Antegrade DUPLEX VELOCITIES (cm/sec) CCA peak systolic                   80                70 ECA peak systolic                   108               173 ICA peak systolic                   158               79 ICA end diastolic                   41                20 PLAQUE MORPHOLOGY:                  Heterogeneous     Heterogeneous PLAQUE AMOUNT:                      Moderate          Mild PLAQUE LOCATION:                    ICA, ECA          ICA, ECA  IMPRESSION: 1. Right internal carotid artery velocity suggests 40%-59% stenosis. 2. Left internal carotid artery velocity suggests 1%-39% stenosis. 3. Bilateral external carotid artery stenosis. 4. Antegrade vertebral arteries bilaterally.  ___________________________________________ Larina Earthly, M.D.  EM/MEDQ  D:  01/19/2011  T:  01/19/2011  Job:  [id number]

## 2011-03-06 ENCOUNTER — Encounter: Payer: Self-pay | Admitting: Cardiovascular Disease

## 2011-03-10 ENCOUNTER — Encounter: Payer: Self-pay | Admitting: Cardiovascular Disease

## 2011-03-10 ENCOUNTER — Ambulatory Visit (INDEPENDENT_AMBULATORY_CARE_PROVIDER_SITE_OTHER): Payer: Medicare Other | Admitting: Cardiovascular Disease

## 2011-03-10 DIAGNOSIS — I10 Essential (primary) hypertension: Secondary | ICD-10-CM

## 2011-03-10 DIAGNOSIS — I2510 Atherosclerotic heart disease of native coronary artery without angina pectoris: Secondary | ICD-10-CM

## 2011-03-10 DIAGNOSIS — E7800 Pure hypercholesterolemia, unspecified: Secondary | ICD-10-CM

## 2011-03-10 NOTE — Patient Instructions (Signed)
Your physician wants you to follow-up in: 12 months with Dr. Excell Seltzer.  You will receive a reminder letter in the mail two months in advance. If you don't receive a letter, please call our office to schedule the follow-up appointment.  Your physician has requested that you have en exercise stress myoview. For further information please visit [email or web address] Please follow instruction sheet, as given.  Please check blood pressure daily and keep record of readings.  Take these readings to your primary doctor when you see him in December.

## 2011-03-10 NOTE — Assessment & Plan Note (Signed)
The patient is on a combination of atorvastatin and Niaspan. Goal LDL is less than 70.

## 2011-03-10 NOTE — Assessment & Plan Note (Signed)
The patient's blood pressure is elevated in the office today. She has not been checking her blood pressure regularly at home, but notes that it is generally well controlled in the mornings and it becomes elevated sometimes in the afternoons. I've asked her to record all blood pressures and bring them in at the next time she sees Dr. Ricki Miller in followup. She is on a combination of amlodipine, metoprolol, and enalapril. She reports an intolerance to higher doses of amlodipine because of leg edema. If blood pressure is consistently elevated would consider addition of a diuretic therapy.

## 2011-03-10 NOTE — Progress Notes (Signed)
HPI:  This is a 70 year old woman presenting for followup evaluation. The patient has coronary artery disease and has undergone multiple PCI procedures. She has a history of drug eluting stent implantation in the LAD and right coronary arteries. The patient underwent followup stress testing demonstrating inferior ischemia and she was found to have in-stent restenosis of the right coronary artery. This was treated with balloon angioplasty. Over the past 12 months she has had no exertional symptoms. She specifically denies chest pain or tightness. She denies dyspnea, edema, orthopnea, or PND.  Outpatient Encounter Prescriptions as of 03/10/2011  Medication Sig Dispense Refill  . amLODipine (NORVASC) 5 MG tablet Take 5 mg by mouth daily.        . Ascorbic Acid (VITAMIN C) 1000 MG tablet Take 1,000 mg by mouth daily.        Marland Kitchen aspirin (ASPIR-81) 81 MG EC tablet Take 162 mg by mouth daily.       Marland Kitchen atorvastatin (LIPITOR) 20 MG tablet Take 20 mg by mouth daily.        . Calcium Carbonate-Vit D-Min 1200-1000 MG-UNIT CHEW Chew 1 capsule by mouth.        . cholecalciferol (VITAMIN D) 1000 UNITS tablet Take 1,000 Units by mouth daily.        . clopidogrel (PLAVIX) 75 MG tablet Take 75 mg by mouth daily.        . enalapril (VASOTEC) 20 MG tablet Take 20 mg by mouth 2 (two) times daily.        . metoprolol (LOPRESSOR) 50 MG tablet Take 50 mg by mouth 2 (two) times daily.        . Multiple Vitamins-Minerals (MULTIVITAL) tablet Take 1 tablet by mouth daily.        . niacin 500 MG tablet Take 500 mg by mouth daily.        . Omega-3 Fatty Acids (FISH OIL) 1000 MG CAPS Take by mouth daily.        Marland Kitchen DISCONTD: metoprolol (TOPROL-XL) 50 MG 24 hr tablet Take 50 mg by mouth daily.          Allergies  Allergen Reactions  . Penicillins   . Sulfonamide Derivatives     Past Medical History  Diagnosis Date  . CAD (coronary artery disease)     s/p PCI- LAD and RCA with drug-eluting stents, PTCA in 2009 to treat in  stent restenosis in RCA  . Essential hypertension   . HLD (hyperlipidemia)   . Asymptomatic carotid artery stenosis     ROS: Negative except as per HPI  BP 159/72  Pulse 58  Ht 5\' 3"  (1.6 m)  Wt 140 lb 12.8 oz (63.866 kg)  BMI 24.94 kg/m2  PHYSICAL EXAM: Pt is alert and oriented, very pleasant woman in NAD HEENT: normal Neck: JVP - normal, carotids 2+= without bruits Lungs: CTA bilaterally CV: RRR without murmur or gallop Abd: soft, NT, Positive BS, no hepatomegaly Ext: no C/C/E, distal pulses intact and equal Skin: warm/dry no rash  EKG: sinus bradycardia 58 beats per minute, LVH, otherwise within normal limits.  ASSESSMENT AND PLAN:

## 2011-03-10 NOTE — Assessment & Plan Note (Addendum)
The patient is stable without angina. However, she has presented with silent ischemia in the past. With a history of multiple PCI procedures I would recommend an exercise Myoview stress study to rule out significant ischemia. Otherwise recommend continuation of her current medical program. Pending the results of her Myoview scan, I would recommend followup in 12 months.

## 2011-03-16 ENCOUNTER — Ambulatory Visit (HOSPITAL_COMMUNITY): Payer: Medicare Other | Attending: Cardiovascular Disease | Admitting: Radiology

## 2011-03-16 DIAGNOSIS — I2510 Atherosclerotic heart disease of native coronary artery without angina pectoris: Secondary | ICD-10-CM

## 2011-03-16 DIAGNOSIS — I10 Essential (primary) hypertension: Secondary | ICD-10-CM

## 2011-03-16 MED ORDER — TECHNETIUM TC 99M TETROFOSMIN IV KIT
10.8000 | PACK | Freq: Once | INTRAVENOUS | Status: AC | PRN
  Administered 2011-03-16: 11 via INTRAVENOUS

## 2011-03-16 MED ORDER — TECHNETIUM TC 99M TETROFOSMIN IV KIT
33.0000 | PACK | Freq: Once | INTRAVENOUS | Status: AC | PRN
  Administered 2011-03-16: 33 via INTRAVENOUS

## 2011-03-16 NOTE — Progress Notes (Signed)
[facility] SITE 3 NUCLEAR MED [address] [place] [address] [phone]  Cardiology Nuclear Med Study  Jennifer Russo is a 70 y.o. female [id number] [birth year]   Nuclear Med Background Indication for Stress Test:  Evaluation for Ischemia, Stent Patency and PTCA Patency History: 11/09 Angioplasty: for in-stent restenosis, 11/09 Heart Catheterization: EF 60% RCA 99% in stent restenosis, 2006 Myocardial Infarction, 2009 Myocardial Perfusion Study: inf. ischemia and 2006 Stents: LAD/RCA Cardiac Risk Factors: Carotid Disease, CVA, Hypertension and Lipids  Symptoms:  DOE and Palpitations   Nuclear Pre-Procedure Caffeine/Decaff Intake:  None NPO After: 7:00pm   Lungs:  clear IV 0.9% NS with Angio Cath:  22g  IV Site: L Hand  IV Started by:  Irean Hong, RN  Chest Size (in):  38 Cup Size: A  Height: 5\' 3"  (1.6 m)  Weight:  137 lb (62.143 kg)  BMI:  Body mass index is 24.27 kg/(m^2). Tech Comments:  Held metoprolol x 36 hrs    Nuclear Med Study 1 or 2 day study: 1 day  Stress Test Type:  Stress  Reading MD: Willa Rough, MD  Order Authorizing Provider:  M.Cooper  Resting Radionuclide: Technetium 71m Tetrofosmin  Resting Radionuclide Dose: 10.8 mCi   Stress Radionuclide:  Technetium 67m Tetrofosmin  Stress Radionuclide Dose: 33.0 mCi           Stress Protocol Rest HR: 60 Stress HR: 133  Rest BP: 146/69 Stress BP: 211/78  Exercise Time (min): 8:00 METS: 10.10   Predicted Max HR: 148 bpm % Max HR: 89.86 bpm Rate Pressure Product: 21308   Dose of Adenosine (mg):  n/a Dose of Lexiscan: n/a mg  Dose of Atropine (mg): n/a Dose of Dobutamine: n/a mcg/kg/min (at max HR)  Stress Test Technologist: Frederick Peers, EMT-P  Nuclear Technologist:  Doyne Keel, CNMT     Rest Procedure:  Myocardial perfusion imaging was performed at rest 45 minutes following the intravenous administration of Technetium 33m Tetrofosmin. Rest ECG: NSR  Stress Procedure:  The  patient exercised for 8:00.  The patient stopped due to fatigue, sob, and denied any chest pain.  There were non specific ST-T wave changes and occ pvcs.  Technetium 53m Tetrofosmin was injected at peak exercise and myocardial perfusion imaging was performed after a brief delay. Stress ECG: ST cahnges are present  QPS Raw Data Images:  Normal; no motion artifact; normal heart/lung ratio. Stress Images:  Normal homogeneous uptake in all areas of the myocardium. Rest Images:  Normal homogeneous uptake in all areas of the myocardium. Subtraction (SDS):  No evidence of ischemia. Transient Ischemic Dilatation (Normal <1.22):  1.00 Lung/Heart Ratio (Normal <0.45):  0.29  Quantitative Gated Spect Images QGS EDV:  70 ml QGS ESV:  23 ml QGS cine images:  Normal Wall Motion QGS EF: 67%  Impression Exercise Capacity:  Fair exercise capacity. BP Response:  Hypertensive blood pressure response. Clinical Symptoms:  SOB ECG Impression:  There is downsloping ST depression with stress. Ischemia can not be ruled out from the EKG. Comparison with Prior Nuclear Study: See below  Overall Impression:  The study is similar to the report of a study done 2010. At that time, the EKG was abnormal, b ut the nuclear normal. Currently, the findings are the same. The EKGs are abnormal. However, the nuclear images are normal.   Willa Rough

## 2011-03-17 NOTE — Progress Notes (Signed)
Nuclear report routed to Dr. Cooper. Jennifer Russo  

## 2011-03-18 ENCOUNTER — Encounter: Payer: Self-pay | Admitting: *Deleted

## 2011-03-22 NOTE — Progress Notes (Signed)
Normal nuclear imaging noted. Abnormal ECG was noted at last stress test as well. Continue current med Rx.

## 2011-03-24 NOTE — Progress Notes (Signed)
Pt.notified

## 2011-05-19 LAB — CARDIAC PANEL(CRET KIN+CKTOT+MB+TROPI)
CK, MB: 1.2
Relative Index: INVALID
Total CK: 54

## 2011-05-19 LAB — BASIC METABOLIC PANEL
BUN: 9
Chloride: 109
Glucose, Bld: 100 — ABNORMAL HIGH
Potassium: 4.1

## 2011-05-19 LAB — CBC
HCT: 38.8
Hemoglobin: 13.4
MCV: 93
Platelets: 232
WBC: 8.6

## 2011-05-27 ENCOUNTER — Other Ambulatory Visit: Payer: Self-pay | Admitting: Internal Medicine

## 2011-05-27 DIAGNOSIS — Z1231 Encounter for screening mammogram for malignant neoplasm of breast: Secondary | ICD-10-CM

## 2011-07-06 ENCOUNTER — Ambulatory Visit
Admission: RE | Admit: 2011-07-06 | Discharge: 2011-07-06 | Disposition: A | Discharge status: Home / Self Care | Payer: Medicare Other | Source: Ambulatory Visit | Attending: Internal Medicine | Admitting: Internal Medicine

## 2011-07-06 DIAGNOSIS — Z1231 Encounter for screening mammogram for malignant neoplasm of breast: Secondary | ICD-10-CM

## 2011-08-06 ENCOUNTER — Other Ambulatory Visit: Payer: Self-pay | Admitting: Internal Medicine

## 2011-08-13 ENCOUNTER — Other Ambulatory Visit: Payer: Medicare Other

## 2011-08-14 ENCOUNTER — Ambulatory Visit
Admission: RE | Admit: 2011-08-14 | Discharge: 2011-08-14 | Disposition: A | Discharge status: Home / Self Care | Payer: Medicare Other | Source: Ambulatory Visit | Attending: Internal Medicine | Admitting: Internal Medicine

## 2011-11-09 ENCOUNTER — Encounter: Payer: Self-pay | Admitting: Cardiovascular Disease

## 2012-01-08 ENCOUNTER — Encounter: Payer: Self-pay | Admitting: Vascular Surgery

## 2012-01-21 ENCOUNTER — Other Ambulatory Visit: Payer: Medicare Other

## 2012-01-21 ENCOUNTER — Ambulatory Visit: Payer: Medicare Other | Admitting: Neurosurgery

## 2012-01-27 ENCOUNTER — Encounter: Payer: Self-pay | Admitting: Neurosurgery

## 2012-01-28 ENCOUNTER — Encounter: Payer: Self-pay | Admitting: Neurosurgery

## 2012-01-28 ENCOUNTER — Ambulatory Visit (INDEPENDENT_AMBULATORY_CARE_PROVIDER_SITE_OTHER): Payer: Medicare Other | Admitting: Vascular Surgery

## 2012-01-28 ENCOUNTER — Ambulatory Visit (INDEPENDENT_AMBULATORY_CARE_PROVIDER_SITE_OTHER): Payer: Medicare Other | Admitting: Neurosurgery

## 2012-01-28 VITALS — BP 148/72 | HR 48 | Resp 14 | Ht 63.0 in | Wt 139.9 lb

## 2012-01-28 DIAGNOSIS — I6529 Occlusion and stenosis of unspecified carotid artery: Secondary | ICD-10-CM

## 2012-01-28 NOTE — Progress Notes (Signed)
[facility] [place] HISTORY AND PHYSICAL   CC: Annual carotid duplex for known stenosis Referring Physician: Early  History of Present Illness: 71 year old female patient of Dr. Arbie Cookey is followed for serial duplex for known stenosis. The patient denies any signs or symptoms of CVA, TIA, amaurosis fugax or any neural deficit. The patient also denies any new medical diagnoses or recent surgeries.  Past Medical History  Diagnosis Date  . CAD (coronary artery disease)     s/p PCI- LAD and RCA with drug-eluting stents, PTCA in 2009 to treat in stent restenosis in RCA  . Essential hypertension   . HLD (hyperlipidemia)   . Asymptomatic carotid artery stenosis     ROS: [x]  Positive   [ ]  Denies    General: [ ]  Weight loss, [ ]  Fever, [ ]  chills Neurologic: [ ]  Dizziness, [ ]  Blackouts, [ ]  Seizure [ ]  Stroke, [ ]  "Mini stroke", [ ]  Slurred speech, [ ]  Temporary blindness; [ ]  weakness in arms or legs, [ ]  Hoarseness Cardiac: [ ]  Chest pain/pressure, [ ]  Shortness of breath at rest [ ]  Shortness of breath with exertion, [ ]  Atrial fibrillation or irregular heartbeat Vascular: [ ]  Pain in legs with walking, [ ]  Pain in legs at rest, [ ]  Pain in legs at night,  [ ]  Non-healing ulcer, [ ]  Blood clot in vein/DVT,   Pulmonary: [ ]  Home oxygen, [ ]  Productive cough, [ ]  Coughing up blood, [ ]  Asthma,  [ ]  Wheezing Musculoskeletal:  [ ]  Arthritis, [ ]  Low back pain, [ ]  Joint pain Hematologic: [ ]  Easy Bruising, [ ]  Anemia; [ ]  Hepatitis Gastrointestinal: [ ]  Blood in stool, [ ]  Gastroesophageal Reflux/heartburn, [ ]  Trouble swallowing Urinary: [ ]  chronic Kidney disease, [ ]  on HD - [ ]  MWF or [ ]  TTHS, [ ]  Burning with urination, [ ]  Difficulty urinating Skin: [ ]  Rashes, [ ]  Wounds Psychological: [ ]  Anxiety, [ ]  Depression   Social History History  Substance Use Topics  . Smoking status: Never Smoker   . Smokeless tobacco: Not on file  . Alcohol Use: No    Family  History Family History  Problem Relation Age of Onset  . Hypertension Mother   . Diabetes Father     Allergies  Allergen Reactions  . Penicillins   . Sulfonamide Derivatives     Current Outpatient Prescriptions  Medication Sig Dispense Refill  . cholecalciferol (VITAMIN D) 1000 UNITS tablet Take 2,000 Units by mouth daily.       Marland Kitchen amLODipine (NORVASC) 5 MG tablet Take 5 mg by mouth daily.        . Ascorbic Acid (VITAMIN C) 1000 MG tablet Take 1,000 mg by mouth daily.        Marland Kitchen aspirin (ASPIR-81) 81 MG EC tablet Take 162 mg by mouth daily.       Marland Kitchen atorvastatin (LIPITOR) 20 MG tablet Take 20 mg by mouth daily.        . Calcium Carbonate-Vit D-Min 1200-1000 MG-UNIT CHEW Chew 1 capsule by mouth.        . clopidogrel (PLAVIX) 75 MG tablet Take 75 mg by mouth daily.        . enalapril (VASOTEC) 20 MG tablet Take 20 mg by mouth 2 (two) times daily.        . metoprolol (LOPRESSOR) 50 MG tablet Take 50 mg by mouth 2 (two) times daily.        Marland Kitchen  Multiple Vitamins-Minerals (MULTIVITAL) tablet Take 1 tablet by mouth daily.        . niacin 500 MG tablet Take 500 mg by mouth daily.        . Omega-3 Fatty Acids (FISH OIL) 1000 MG CAPS Take by mouth daily.          Physical Examination  Filed Vitals:   01/28/12 1146  BP: 148/72  Pulse: 48  Resp:     Body mass index is 24.78 kg/(m^2).  General:  WDWN in NAD Gait: Normal HEENT: WNL Eyes: Pupils equal Pulmonary: normal non-labored breathing , without Rales, rhonchi,  wheezing Cardiac: RRR, without  Murmurs, rubs or gallops; Abdomen: soft, NT, no masses Skin: no rashes, ulcers noted  Vascular Exam Pulses: 2+ radial pulses bilaterally Carotid bruits: Carotid pulses to auscultation no bruits are heard Extremities without ischemic changes, no Gangrene , no cellulitis; no open wounds;  Musculoskeletal: no muscle wasting or atrophy   Neurologic: A&O X 3; Appropriate Affect ; SENSATION: normal; MOTOR FUNCTION:  moving all extremities  equally. Speech is fluent/normal  Non-Invasive Vascular Imaging CAROTID DUPLEX 01/28/2012  Right ICA 40 - 59 % stenosis Left ICA 20 - 39 % stenosis   ASSESSMENT/PLAN: Asymptomatic carotid stenosis that is stable, the patient return in one year for repeat carotid duplex. Her questions were encouraged and answered.  Lauree Chandler ANP   Clinic MD: Darrick Penna

## 2012-01-28 NOTE — Addendum Note (Signed)
Addended by: Sharee Pimple on: 01/28/2012 12:08 PM   Modules accepted: Orders

## 2012-02-04 NOTE — Procedures (Unsigned)
CAROTID DUPLEX EXAM  INDICATION:  Carotid stenosis.  HISTORY: Diabetes:  No Cardiac:  CAD, stent Hypertension:  Yes Smoking:  No Previous Surgery:  No carotid intervention CV History:  Asymptomatic Amaurosis Fugax No, Paresthesias No, Hemiparesis No                                      RIGHT             LEFT Brachial systolic pressure:         158               156 Brachial Doppler waveforms:         WNL               WNL Vertebral direction of flow:        Antegrade         Antegrade DUPLEX VELOCITIES (cm/sec) CCA peak systolic                   102               73 ECA peak systolic                   138               191 ICA peak systolic                   207               108 ICA end diastolic                   42                16 PLAQUE MORPHOLOGY:                  Heterogenous      Heterogenous PLAQUE AMOUNT:                      Moderate          Mild PLAQUE LOCATION:                    CCA/ICA           ECA  IMPRESSION:  Right internal carotid artery stenosis present in the 40% to 59% range. Right external carotid artery appears patent. Left external carotid artery stenosis present. Left internal carotid artery stenosis present in the 1% to 39% range. Bilateral vertebral arteries are patent and antegrade. Essentially unchanged since previous study on 01/19/2011.  ___________________________________________ Larina Earthly, M.D.  SH/MEDQ  D:  01/28/2012  T:  01/28/2012  Job:  [id number]

## 2012-02-08 ENCOUNTER — Encounter: Payer: Self-pay | Admitting: Cardiovascular Disease

## 2012-02-09 ENCOUNTER — Telehealth: Payer: Self-pay | Admitting: Cardiovascular Disease

## 2012-02-09 NOTE — Telephone Encounter (Signed)
Fu msg Pt returning your call 

## 2012-02-09 NOTE — Telephone Encounter (Signed)
I did not call this pt.  The pt came into the office and scheduled her next appointment.

## 2012-02-12 ENCOUNTER — Encounter: Payer: Self-pay | Admitting: Cardiovascular Disease

## 2012-04-06 ENCOUNTER — Encounter: Payer: Self-pay | Admitting: Cardiovascular Disease

## 2012-04-06 ENCOUNTER — Ambulatory Visit (INDEPENDENT_AMBULATORY_CARE_PROVIDER_SITE_OTHER): Payer: Medicare Other | Admitting: Cardiovascular Disease

## 2012-04-06 VITALS — BP 148/57 | HR 54 | Ht 63.0 in | Wt 137.0 lb

## 2012-04-06 DIAGNOSIS — E7800 Pure hypercholesterolemia, unspecified: Secondary | ICD-10-CM

## 2012-04-06 DIAGNOSIS — I2510 Atherosclerotic heart disease of native coronary artery without angina pectoris: Secondary | ICD-10-CM

## 2012-04-06 DIAGNOSIS — I10 Essential (primary) hypertension: Secondary | ICD-10-CM

## 2012-04-06 NOTE — Patient Instructions (Addendum)
Your physician wants you to follow-up in: 1 year with Dr. Cooper.  You will receive a reminder letter in the mail two months in advance. If you don't receive a letter, please call our office to schedule the follow-up appointment.  

## 2012-04-06 NOTE — Progress Notes (Signed)
HPI:  Jennifer Russo returns for followup evaluation. She is a delightful 71 year old woman with coronary artery disease. She's undergone multiple PCI procedures, last in 2009. She has been followed by Dr. Darrick Penna for asymptomatic carotid disease. She presents today for annual cardiology followup. Test, she exercised for 8 minutes without anginal symptoms. There were nonspecific ST and T wave changes. Her nuclear images were normal without evidence of ischemia and her ejection fraction was 67%. The patient had recent labs drawn in Dr. Lynne Logan office and these were reviewed today. Her cholesterol is 134, triglycerides 129, HDL 32, and LDL 76. Renal function is normal with a creatinine of 0.8. Liver function tests were within normal limits.  From a symptomatic perspective she has been doing very well. She swims 4 times per week without exertional symptoms. She specifically denies chest pain or pressure, dyspnea, edema, or palpitations.  Outpatient Encounter Prescriptions as of 04/06/2012  Medication Sig Dispense Refill  . amLODipine (NORVASC) 5 MG tablet Take 5 mg by mouth daily.        . Ascorbic Acid (VITAMIN C) 1000 MG tablet Take 1,000 mg by mouth daily.        Marland Kitchen aspirin (ASPIR-81) 81 MG EC tablet Take 162 mg by mouth daily.       Marland Kitchen atorvastatin (LIPITOR) 20 MG tablet Take 20 mg by mouth daily.        . Biotin 5000 MCG CAPS Take by mouth. daily      . calcium carbonate (CALCIUM 600) 600 MG TABS Take 600 mg by mouth 2 (two) times daily with a meal.      . cholecalciferol (VITAMIN D) 1000 UNITS tablet Take 2,000 Units by mouth daily.       . clopidogrel (PLAVIX) 75 MG tablet Take 75 mg by mouth daily.        . enalapril (VASOTEC) 20 MG tablet Take 20 mg by mouth 2 (two) times daily.        Marland Kitchen levothyroxine (SYNTHROID, LEVOTHROID) 50 MCG tablet Take 50 mcg by mouth daily.      . metoprolol (LOPRESSOR) 50 MG tablet Take 50 mg by mouth 2 (two) times daily.        . Multiple Vitamins-Minerals (MULTIVITAL)  tablet Take 1 tablet by mouth daily.        . niacin 500 MG tablet Take 500 mg by mouth daily.        . Omega-3 Fatty Acids (FISH OIL) 1000 MG CAPS Take by mouth daily.        Marland Kitchen DISCONTD: Calcium Carbonate-Vit D-Min 1200-1000 MG-UNIT CHEW Chew 1 capsule by mouth.          Allergies  Allergen Reactions  . Penicillins   . Sulfonamide Derivatives     Past Medical History  Diagnosis Date  . CAD (coronary artery disease)     s/p PCI- LAD and RCA with drug-eluting stents, PTCA in 2009 to treat in stent restenosis in RCA  . Essential hypertension   . HLD (hyperlipidemia)   . Asymptomatic carotid artery stenosis     ROS: Negative except as per HPI  BP 148/57  Pulse 54  Ht 5\' 3"  (1.6 m)  Wt 62.143 kg (137 lb)  BMI 24.27 kg/m2  PHYSICAL EXAM: Pt is alert and oriented, NAD HEENT: normal Neck: JVP - normal, carotids 2+= with soft bilateral bruits Lungs: CTA bilaterally CV: RRR without murmur or gallop Abd: soft, NT, Positive BS, no hepatomegaly Ext: no C/C/E, distal pulses intact  and equal Skin: warm/dry no rash  EKG:  Sinus bradycardia 55 beats per minute, voltage criteria for LVH, otherwise within normal limits.  ASSESSMENT AND PLAN:

## 2012-04-06 NOTE — Assessment & Plan Note (Signed)
LDL is at goal. HDL is low. She will continue on atorvastatin 20 mg.

## 2012-04-06 NOTE — Assessment & Plan Note (Addendum)
The patient has hypertension on 3 drugs. She has not tolerated higher doses of amlodipine because of leg edema. I recommended the addition of low-dose hydrochlorothiazide. However, she is resistant to adding another medication at this point. Her hypertension control is borderline, primarily with systolic hypertension and blood pressure readings in the 140s. She would like to review with Dr. Ricki Miller before starting hydrochlorothiazide. For now she will continue on a combination of amlodipine, enalapril, and metoprolol.

## 2012-04-06 NOTE — Assessment & Plan Note (Signed)
The patient is stable without anginal symptoms. She is tolerating aspirin and a statin drug. Her last nuclear scan was reviewed and it showed no ischemia. Will followup in one year. She remains on dual antiplatelet therapy with aspirin and Plavix and reports no bleeding problems.

## 2012-05-30 ENCOUNTER — Other Ambulatory Visit: Payer: Self-pay | Admitting: Internal Medicine

## 2012-05-30 DIAGNOSIS — Z1231 Encounter for screening mammogram for malignant neoplasm of breast: Secondary | ICD-10-CM

## 2012-07-06 ENCOUNTER — Ambulatory Visit
Admission: RE | Admit: 2012-07-06 | Discharge: 2012-07-06 | Disposition: A | Discharge status: Home / Self Care | Payer: Medicare Other | Source: Ambulatory Visit | Attending: Internal Medicine | Admitting: Internal Medicine

## 2012-07-06 DIAGNOSIS — Z1231 Encounter for screening mammogram for malignant neoplasm of breast: Secondary | ICD-10-CM

## 2013-01-27 ENCOUNTER — Ambulatory Visit: Payer: Medicare Other | Admitting: Neurosurgery

## 2013-01-27 ENCOUNTER — Other Ambulatory Visit (INDEPENDENT_AMBULATORY_CARE_PROVIDER_SITE_OTHER): Payer: Medicare Other | Admitting: *Deleted

## 2013-01-27 DIAGNOSIS — I6529 Occlusion and stenosis of unspecified carotid artery: Secondary | ICD-10-CM

## 2013-01-30 ENCOUNTER — Other Ambulatory Visit: Payer: Self-pay | Admitting: *Deleted

## 2013-02-01 ENCOUNTER — Encounter: Payer: Self-pay | Admitting: Vascular Surgery

## 2013-04-07 ENCOUNTER — Encounter: Payer: Self-pay | Admitting: Cardiovascular Disease

## 2013-04-07 ENCOUNTER — Ambulatory Visit (INDEPENDENT_AMBULATORY_CARE_PROVIDER_SITE_OTHER): Payer: Medicare Other | Admitting: Cardiovascular Disease

## 2013-04-07 VITALS — BP 120/62 | HR 53 | Ht 63.0 in | Wt 144.0 lb

## 2013-04-07 DIAGNOSIS — E7800 Pure hypercholesterolemia, unspecified: Secondary | ICD-10-CM

## 2013-04-07 DIAGNOSIS — I10 Essential (primary) hypertension: Secondary | ICD-10-CM

## 2013-04-07 DIAGNOSIS — I2510 Atherosclerotic heart disease of native coronary artery without angina pectoris: Secondary | ICD-10-CM

## 2013-04-07 NOTE — Progress Notes (Signed)
HPI:  72 year old woman presenting for followup of coronary artery disease. She has undergone PCI, most recently in 2009.her last nuclear scan was in 2012 and this demonstrated normal myocardial perfusion. Notably she does have an abnormal exercise EKG which has been documented several times in the past.  The patient was having some issues with elevated blood pressure readings but after some changes were made she notes good control recently. She's had no chest pain, chest pressure, dyspnea, or palpitations. She's had mild leg swelling with amlodipine but with the addition of hydrochlorothiazide this is improved.  Outpatient Encounter Prescriptions as of 04/07/2013  Medication Sig Dispense Refill  . amLODipine (NORVASC) 10 MG tablet Take 10 mg by mouth daily.      . Ascorbic Acid (VITAMIN C) 1000 MG tablet Take 1,000 mg by mouth daily.        Marland Kitchen aspirin (ASPIR-81) 81 MG EC tablet Take 162 mg by mouth daily.       Marland Kitchen atorvastatin (LIPITOR) 10 MG tablet Take 10 mg by mouth daily.      . Biotin 5000 MCG CAPS Take by mouth. daily      . calcium carbonate (CALCIUM 600) 600 MG TABS Take 600 mg by mouth 2 (two) times daily with a meal.      . Cholecalciferol (VITAMIN D) 2000 UNITS CAPS Take 2,000 Units by mouth daily.      . clopidogrel (PLAVIX) 75 MG tablet Take 75 mg by mouth daily.        Marland Kitchen levothyroxine (SYNTHROID, LEVOTHROID) 50 MCG tablet Take 50 mcg by mouth daily.      . metoprolol (LOPRESSOR) 50 MG tablet Take 50 mg by mouth 2 (two) times daily.        . Multiple Vitamins-Minerals (MULTIVITAL) tablet Take 1 tablet by mouth daily.        Marland Kitchen olmesartan-hydrochlorothiazide (BENICAR HCT) 40-12.5 MG per tablet Take 1 tablet by mouth daily.      . Omega-3 Fatty Acids (FISH OIL) 1000 MG CAPS Take by mouth daily.        . [DISCONTINUED] amLODipine (NORVASC) 5 MG tablet Take 5 mg by mouth daily.        . [DISCONTINUED] atorvastatin (LIPITOR) 20 MG tablet Take 20 mg by mouth daily.        .  [DISCONTINUED] cholecalciferol (VITAMIN D) 1000 UNITS tablet Take 2,000 Units by mouth daily.       . [DISCONTINUED] enalapril (VASOTEC) 20 MG tablet Take 20 mg by mouth 2 (two) times daily.        . [DISCONTINUED] niacin 500 MG tablet Take 500 mg by mouth daily.         No facility-administered encounter medications on file as of 04/07/2013.    Allergies  Allergen Reactions  . Penicillins   . Sulfonamide Derivatives     Past Medical History  Diagnosis Date  . CAD (coronary artery disease)     s/p PCI- LAD and RCA with drug-eluting stents, PTCA in 2009 to treat in stent restenosis in RCA  . Essential hypertension   . HLD (hyperlipidemia)   . Asymptomatic carotid artery stenosis     ROS: Negative except as per HPI  BP 120/62  Pulse 53  Ht 5\' 3"  (1.6 m)  Wt 144 lb (65.318 kg)  BMI 25.51 kg/m2  SpO2 97%  PHYSICAL EXAM: Pt is alert and oriented, NAD HEENT: normal Neck: JVP - normal, carotids 2+= with soft bilateral bruits Lungs: CTA bilaterally CV:  RRR without murmur or gallop Abd: soft, NT, Positive BS, no hepatomegaly Ext: no C/C/E, distal pulses intact and equal Skin: warm/dry no rash  EKG:  Sinus bradycardia 53 beats per minute, first degree AV block, minimal voltage criteria for LVH maybe normal variant.  ASSESSMENT AND PLAN: 1. Coronary artery disease, native vessel. The patient is stable without anginal symptoms. She's had multivessel stenting and has done well on aspirin and Plavix. We'll continue the same. She's on a beta blocker, ARB, and statin drug. I will see her back in one year.  2. Carotid stenosis. Followed by Dr. Arbie Cookey. Appropriate risk reduction medications are onboard. She has no neurologic symptoms.  3. Hypertension. Blood pressure under ideal control. Dr. Ricki Miller manages this.  4. Hyperlipidemia. She remains on atorvastatin.  Tonny Bollman 04/07/2013 2:33 PM

## 2013-04-07 NOTE — Patient Instructions (Signed)
Your physician wants you to follow-up in: 1 YEAR with Dr Cooper.  You will receive a reminder letter in the mail two months in advance. If you don't receive a letter, please call our office to schedule the follow-up appointment.  Your physician recommends that you continue on your current medications as directed. Please refer to the Current Medication list given to you today.  

## 2013-06-07 ENCOUNTER — Other Ambulatory Visit: Payer: Self-pay

## 2013-06-07 DIAGNOSIS — Z1231 Encounter for screening mammogram for malignant neoplasm of breast: Secondary | ICD-10-CM

## 2013-07-07 ENCOUNTER — Ambulatory Visit
Admission: RE | Admit: 2013-07-07 | Discharge: 2013-07-07 | Disposition: A | Discharge status: Home / Self Care | Payer: Medicare Other | Source: Ambulatory Visit

## 2013-07-07 DIAGNOSIS — Z1231 Encounter for screening mammogram for malignant neoplasm of breast: Secondary | ICD-10-CM

## 2013-08-01 ENCOUNTER — Other Ambulatory Visit: Payer: Self-pay | Admitting: Internal Medicine

## 2013-08-01 DIAGNOSIS — R748 Abnormal levels of other serum enzymes: Secondary | ICD-10-CM

## 2013-08-07 ENCOUNTER — Ambulatory Visit
Admission: RE | Admit: 2013-08-07 | Discharge: 2013-08-07 | Disposition: A | Discharge status: Home / Self Care | Payer: Medicare Other | Source: Ambulatory Visit | Attending: Internal Medicine | Admitting: Internal Medicine

## 2013-08-07 DIAGNOSIS — R748 Abnormal levels of other serum enzymes: Secondary | ICD-10-CM

## 2013-09-27 ENCOUNTER — Other Ambulatory Visit: Payer: Self-pay | Admitting: Vascular Surgery

## 2013-09-27 DIAGNOSIS — I6529 Occlusion and stenosis of unspecified carotid artery: Secondary | ICD-10-CM

## 2013-10-02 ENCOUNTER — Encounter (HOSPITAL_COMMUNITY): Payer: Self-pay | Admitting: Emergency Medicine

## 2013-10-02 ENCOUNTER — Emergency Department (HOSPITAL_COMMUNITY): Payer: Medicare Other

## 2013-10-02 ENCOUNTER — Inpatient Hospital Stay (HOSPITAL_COMMUNITY)
Admission: EM | Admit: 2013-10-02 | Discharge: 2013-10-04 | DRG: 394 | Disposition: A | Discharge status: Home / Self Care | Payer: Medicare Other | Attending: General Surgery | Admitting: General Surgery

## 2013-10-02 DIAGNOSIS — I10 Essential (primary) hypertension: Secondary | ICD-10-CM | POA: Diagnosis present

## 2013-10-02 DIAGNOSIS — E872 Acidosis, unspecified: Secondary | ICD-10-CM | POA: Diagnosis present

## 2013-10-02 DIAGNOSIS — Z880 Allergy status to penicillin: Secondary | ICD-10-CM

## 2013-10-02 DIAGNOSIS — Z882 Allergy status to sulfonamides status: Secondary | ICD-10-CM

## 2013-10-02 DIAGNOSIS — Z833 Family history of diabetes mellitus: Secondary | ICD-10-CM

## 2013-10-02 DIAGNOSIS — I6529 Occlusion and stenosis of unspecified carotid artery: Secondary | ICD-10-CM | POA: Diagnosis present

## 2013-10-02 DIAGNOSIS — I2510 Atherosclerotic heart disease of native coronary artery without angina pectoris: Secondary | ICD-10-CM | POA: Diagnosis present

## 2013-10-02 DIAGNOSIS — K559 Vascular disorder of intestine, unspecified: Principal | ICD-10-CM | POA: Diagnosis present

## 2013-10-02 DIAGNOSIS — D72829 Elevated white blood cell count, unspecified: Secondary | ICD-10-CM | POA: Diagnosis present

## 2013-10-02 DIAGNOSIS — E785 Hyperlipidemia, unspecified: Secondary | ICD-10-CM | POA: Diagnosis present

## 2013-10-02 DIAGNOSIS — Z7902 Long term (current) use of antithrombotics/antiplatelets: Secondary | ICD-10-CM

## 2013-10-02 DIAGNOSIS — Z9861 Coronary angioplasty status: Secondary | ICD-10-CM

## 2013-10-02 DIAGNOSIS — Z79899 Other long term (current) drug therapy: Secondary | ICD-10-CM

## 2013-10-02 DIAGNOSIS — Z7982 Long term (current) use of aspirin: Secondary | ICD-10-CM

## 2013-10-02 DIAGNOSIS — Z8249 Family history of ischemic heart disease and other diseases of the circulatory system: Secondary | ICD-10-CM

## 2013-10-02 LAB — CBC WITH DIFFERENTIAL/PLATELET
Basophils Absolute: 0 10*3/uL (ref 0.0–0.1)
Basophils Relative: 0 % (ref 0–1)
Eosinophils Absolute: 0 10*3/uL (ref 0.0–0.7)
Eosinophils Relative: 0 % (ref 0–5)
HCT: 38.6 % (ref 36.0–46.0)
HEMOGLOBIN: 13.6 g/dL (ref 12.0–15.0)
LYMPHS ABS: 1.7 10*3/uL (ref 0.7–4.0)
LYMPHS PCT: 11 % — AB (ref 12–46)
MCH: 32.5 pg (ref 26.0–34.0)
MCHC: 35.2 g/dL (ref 30.0–36.0)
MCV: 92.3 fL (ref 78.0–100.0)
MONOS PCT: 3 % (ref 3–12)
Monocytes Absolute: 0.5 10*3/uL (ref 0.1–1.0)
NEUTROS PCT: 86 % — AB (ref 43–77)
Neutro Abs: 13.7 10*3/uL — ABNORMAL HIGH (ref 1.7–7.7)
PLATELETS: 287 10*3/uL (ref 150–400)
RBC: 4.18 MIL/uL (ref 3.87–5.11)
RDW: 13.4 % (ref 11.5–15.5)
WBC: 16 10*3/uL — AB (ref 4.0–10.5)

## 2013-10-02 LAB — LIPASE, BLOOD: Lipase: 34 U/L (ref 11–59)

## 2013-10-02 LAB — POCT I-STAT TROPONIN I: Troponin i, poc: 0.01 ng/mL (ref 0.00–0.08)

## 2013-10-02 LAB — COMPREHENSIVE METABOLIC PANEL
ALBUMIN: 3.7 g/dL (ref 3.5–5.2)
ALT: 76 U/L — AB (ref 0–35)
AST: 74 U/L — ABNORMAL HIGH (ref 0–37)
Alkaline Phosphatase: 65 U/L (ref 39–117)
BILIRUBIN TOTAL: 0.3 mg/dL (ref 0.3–1.2)
BUN: 29 mg/dL — ABNORMAL HIGH (ref 6–23)
CO2: 24 meq/L (ref 19–32)
Calcium: 9.4 mg/dL (ref 8.4–10.5)
Chloride: 97 mEq/L (ref 96–112)
Creatinine, Ser: 0.95 mg/dL (ref 0.50–1.10)
GFR calc Af Amer: 66 mL/min — ABNORMAL LOW (ref >90)
GFR, EST NON AFRICAN AMERICAN: 57 mL/min — AB (ref >90)
Glucose, Bld: 160 mg/dL — ABNORMAL HIGH (ref 70–99)
POTASSIUM: 4.4 meq/L (ref 3.7–5.3)
SODIUM: 139 meq/L (ref 137–147)
Total Protein: 7.7 g/dL (ref 6.0–8.3)

## 2013-10-02 MED ORDER — MORPHINE SULFATE 4 MG/ML IJ SOLN
4.0000 mg | Freq: Once | INTRAMUSCULAR | Status: AC
  Administered 2013-10-02: 4 mg via INTRAVENOUS
  Filled 2013-10-02: qty 1

## 2013-10-02 MED ORDER — PANTOPRAZOLE SODIUM 40 MG IV SOLR
40.0000 mg | Freq: Once | INTRAVENOUS | Status: AC
  Administered 2013-10-02: 40 mg via INTRAVENOUS
  Filled 2013-10-02: qty 40

## 2013-10-02 MED ORDER — ONDANSETRON HCL 4 MG/2ML IJ SOLN
4.0000 mg | Freq: Once | INTRAMUSCULAR | Status: AC
  Administered 2013-10-02: 4 mg via INTRAVENOUS
  Filled 2013-10-02: qty 2

## 2013-10-02 MED ORDER — FAMOTIDINE IN NACL 20-0.9 MG/50ML-% IV SOLN
20.0000 mg | Freq: Once | INTRAVENOUS | Status: AC
  Administered 2013-10-02: 20 mg via INTRAVENOUS
  Filled 2013-10-02: qty 50

## 2013-10-02 MED ORDER — IOHEXOL 300 MG/ML  SOLN
20.0000 mL | INTRAMUSCULAR | Status: AC
  Administered 2013-10-02: 20 mL via ORAL

## 2013-10-02 MED ORDER — IOHEXOL 300 MG/ML  SOLN
100.0000 mL | Freq: Once | INTRAMUSCULAR | Status: AC | PRN
  Administered 2013-10-02: 100 mL via INTRAVENOUS

## 2013-10-02 NOTE — ED Provider Notes (Signed)
CSN: [id number]     Arrival date & time 10/02/13  2013 History   First MD Initiated Contact with Patient 10/02/13 2037     Chief Complaint  Patient presents with  . Abdominal Pain     (Consider location/radiation/quality/duration/timing/severity/associated sxs/prior Treatment) HPI History provided by pt and her husband who is translating.  Pt developed diffuse abdominal pain w/ associated vomiting and diarrhea between 3 and 4pm today.  The pain has gradually worsened and she can no longer tolerate.  No relief w/ pepto bismol.  Denies fever, CP, SOB, hematemesis/hematochezia/melena, urinary sx.  No h/o similar sx.  No h/o abdominal surgeries.  No known sick contacts.  Per prior chart, pt recently had elevated transaminases, US abdomen ordered and was unremarkable.  Pt can not give me any more information.   Past Medical History  Diagnosis Date  . CAD (coronary artery disease)     s/p PCI- LAD and RCA with drug-eluting stents, PTCA in 2009 to treat in stent restenosis in RCA  . Essential hypertension   . HLD (hyperlipidemia)   . Asymptomatic carotid artery stenosis    Past Surgical History  Procedure Laterality Date  . Coronary angioplasty  2006   Family History  Problem Relation Age of Onset  . Hypertension Mother   . Diabetes Father    History  Substance Use Topics  . Smoking status: Never Smoker   . Smokeless tobacco: Not on file  . Alcohol Use: No   OB History   Grav Para Term Preterm Abortions TAB SAB Ect Mult Living                 Review of Systems  All other systems reviewed and are negative.      Allergies  Penicillins and Sulfonamide derivatives  Home Medications   Current Outpatient Rx  Name  Route  Sig  Dispense  Refill  . amLODipine (NORVASC) 10 MG tablet   Oral   Take 10 mg by mouth daily.         . Ascorbic Acid (VITAMIN C) 1000 MG tablet   Oral   Take 1,000 mg by mouth daily.           Marland Kitchen. aspirin (ASPIR-81) 81 MG EC tablet   Oral   Take  162 mg by mouth every evening.          . Biotin 5000 MCG CAPS   Oral   Take by mouth. daily         . calcium carbonate (CALCIUM 600) 600 MG TABS   Oral   Take 600 mg by mouth 2 (two) times daily with a meal.         . Cholecalciferol (VITAMIN D) 2000 UNITS CAPS   Oral   Take 2,000 Units by mouth daily.         . clopidogrel (PLAVIX) 75 MG tablet   Oral   Take 75 mg by mouth daily.           Marland Kitchen. levothyroxine (SYNTHROID, LEVOTHROID) 50 MCG tablet   Oral   Take 50 mcg by mouth daily.         . metoprolol (LOPRESSOR) 50 MG tablet   Oral   Take 50 mg by mouth 2 (two) times daily.           . Multiple Vitamins-Minerals (MULTIVITAL) tablet   Oral   Take 1 tablet by mouth daily.           . Omega-3  Fatty Acids (FISH OIL) 1000 MG CAPS   Oral   Take 1,000 mg by mouth daily.          . rosuvastatin (CRESTOR) 10 MG tablet   Oral   Take 10 mg by mouth daily.         Marland Kitchen telmisartan-hydrochlorothiazide (MICARDIS HCT) 80-25 MG per tablet   Oral   Take 1 tablet by mouth daily.          BP 132/44  Pulse 52  Temp(Src) 97.5 F (36.4 C) (Oral)  Resp 18  SpO2 98% Physical Exam  Nursing note and vitals reviewed. Constitutional: She is oriented to person, place, and time. She appears well-developed and well-nourished.  Uncomfortable appearing  HENT:  Head: Normocephalic and atraumatic.  Eyes:  Normal appearance  Neck: Normal range of motion.  Cardiovascular: Normal rate and regular rhythm.   Pulmonary/Chest: Effort normal and breath sounds normal. No respiratory distress.  Abdominal: Soft. She exhibits no distension and no mass. There is no rebound and no guarding.  Obese.  Infrequent bowel sounds.  Diffusely, mildly ttp.  Genitourinary:  No CVA tenderness  Musculoskeletal: Normal range of motion.  Neurological: She is alert and oriented to person, place, and time.  Skin: Skin is warm and dry. No rash noted.  Psychiatric: She has a normal mood and  affect. Her behavior is normal.    ED Course  Procedures (including critical care time) Labs Review Labs Reviewed  CBC WITH DIFFERENTIAL - Abnormal; Notable for the following:    WBC 16.0 (*)    Neutrophils Relative % 86 (*)    Neutro Abs 13.7 (*)    Lymphocytes Relative 11 (*)    All other components within normal limits  COMPREHENSIVE METABOLIC PANEL - Abnormal; Notable for the following:    Glucose, Bld 160 (*)    BUN 29 (*)    AST 74 (*)    ALT 76 (*)    GFR calc non Af Amer 57 (*)    GFR calc Af Amer 66 (*)    All other components within normal limits  CG4 I-STAT (LACTIC ACID) - Abnormal; Notable for the following:    Lactic Acid, Venous 3.36 (*)    All other components within normal limits  POCT I-STAT 3, BLOOD GAS (G3+) - Abnormal; Notable for the following:    pO2, Arterial 62.0 (*)    Bicarbonate 25.6 (*)    All other components within normal limits  LIPASE, BLOOD  BLOOD GAS, ARTERIAL  BASIC METABOLIC PANEL  CBC  LACTIC ACID, PLASMA  CBC  BASIC METABOLIC PANEL  POCT I-STAT TROPONIN I   Imaging Review Ct Abdomen Pelvis W Contrast  10/03/2013   CLINICAL DATA:  Acute onset abdominal pain  EXAM: CT ABDOMEN AND PELVIS WITH CONTRAST  TECHNIQUE: Multidetector CT imaging of the abdomen and pelvis was performed using the standard protocol following bolus administration of intravenous contrast.  CONTRAST:  OMNIPAQUE IOHEXOL 300 MG/ML  SOLN  COMPARISON:  US ABDOMEN COMPLETE dated 08/07/2013  FINDINGS: There is bronchiectasis at the lung bases.  No pericardial fluid.  No focal hepatic lesion. The gallbladder, pancreas, spleen, adrenal glands, and kidneys are normal.  The stomach and proximal small bowel are normal. There is a segment of distal small bowel that is mildly inflamed (image 65, series 2). This loops of small bowel is filled with stool suggesting enteric stasis and fecalization. There is small amount of gas within the mesentery adjacent to these loops of  bowel  inflamed bowel (image 62, series 2). Potential tiny locule of gas within the portal vein (image 23, series 2). More distally the small bowel is collapsed in the terminal ileum is normal. The appendix is normal. The colon rectosigmoid colon are collapsed  Abdominal aorta is normal caliber. There is 6 extensive atherosclerotic disease of the aorta is branches. The celiac trunk and SMA are patent. No retroperitoneal periportal lymphadenopathy.  No free fluid the pelvis. No intraperitoneal free air or abscess. No retroperitoneal periportal lymphadenopathy.  No free fluid the pelvis. The uterus and ovaries are the bladder normal. No aggressive osseous lesion.  IMPRESSION: 1. Segment of distal small bowel with fecalization of enteric contents and mild inflammation of the bowel wall and associated mesentery. Trace amount of gas with mesenteric vessels associated with this inflamed with bowel. Findings are concerning for a segment of ischemic distal small bowel. 2. Appendix is normal. 3. Colon is collapsed. Findings conveyed toCATHERINE Maisa Bedingfield on 10/03/2013  at00:47.   Electronically Signed   By: Genevive Bi M.D.   On: 10/03/2013 00:48    EKG Interpretation   None       MDM   Final diagnoses:  Small bowel ischemia    73yo F w/ CAD, HTN and hyperlipidemia presents w/ diffuse abd pain, N/V/D since this afternoon.  No known sick contacts.  Uncomfortable appearing, afebrile, abd soft/non-distended but diffusely, mildly ttp on exam.  Labs significant for negative troponin as well as leukocytosis; CMP/lipase as well as CT abd/pelvis pending. IV moprhine and zofran ordered for sx.  9:27 PM   Vomiting resolved and pain improved but patient still appears uncomfortable.  IV protonix, pepcid and 4mg  morphine ordered.  There are still labs pending. 10:08 PM   Mildly elevated transaminases and lactate 3.36.  CT findings concerning for distal small bowel ischemia.  All results discussed w/ patient.  Her pain is  persistent but improved and she is otherwise asx.  VSS.  GS consulted for admission and will order her abx. 3:33 AM   Otilio Miu, PA-C 10/03/13 [phone]

## 2013-10-02 NOTE — ED Notes (Signed)
Pt reports she ate [employer] for lunch and appx 2-3 hours after had generalized abdominal pain with emesis x 6 and diarrhea x 2. Pt denies pain on palpation, but reports 8/10 cramping pain. Denies blood in urine/stool/emesis. AO x4.

## 2013-10-02 NOTE — ED Notes (Signed)
Patient with acute onset of abdominal pain three hours after lunch.  Pain is generalized.  Patient also experienced diarrhea with vomiting

## 2013-10-02 NOTE — ED Notes (Signed)
CT made aware that pt finished oral contrast  

## 2013-10-02 NOTE — ED Provider Notes (Signed)
Medical screening examination/treatment/procedure(s) were conducted as a shared visit with non-physician practitioner(s) and myself.  I personally evaluated the patient during the encounter.  EKG Interpretation    Date/Time:  Monday October 02 2013 21:08:51 EST Ventricular Rate:  56 PR Interval:  203 QRS Duration: 83 QT Interval:  488 QTC Calculation: 471 R Axis:   62 Text Interpretation:  Sinus rhythm Left ventricular hypertrophy No significant change since last tracing Confirmed by [facility]  [facility], Lukus Binion ([id number]) on 10/02/2013 9:16:52 PM           MInimal diffuse abdominal tenderness without rebound; CT pending. 2350  Hurman HornJohn M Dayan Kreis, MD 10/04/13 [phone]

## 2013-10-03 DIAGNOSIS — R109 Unspecified abdominal pain: Secondary | ICD-10-CM

## 2013-10-03 DIAGNOSIS — K559 Vascular disorder of intestine, unspecified: Secondary | ICD-10-CM | POA: Diagnosis present

## 2013-10-03 LAB — POCT I-STAT 3, ART BLOOD GAS (G3+)
Acid-Base Excess: 1 mmol/L (ref 0.0–2.0)
BICARBONATE: 25.6 meq/L — AB (ref 20.0–24.0)
O2 Saturation: 93 %
PH ART: 7.421 (ref 7.350–7.450)
PO2 ART: 62 mmHg — AB (ref 80.0–100.0)
Patient temperature: 97.5
TCO2: 27 mmol/L (ref 0–100)
pCO2 arterial: 39.2 mmHg (ref 35.0–45.0)

## 2013-10-03 LAB — BASIC METABOLIC PANEL
BUN: 25 mg/dL — ABNORMAL HIGH (ref 6–23)
CHLORIDE: 101 meq/L (ref 96–112)
CO2: 24 mEq/L (ref 19–32)
Calcium: 8.6 mg/dL (ref 8.4–10.5)
Creatinine, Ser: 0.84 mg/dL (ref 0.50–1.10)
GFR, EST AFRICAN AMERICAN: 77 mL/min — AB (ref >90)
GFR, EST NON AFRICAN AMERICAN: 66 mL/min — AB (ref >90)
Glucose, Bld: 136 mg/dL — ABNORMAL HIGH (ref 70–99)
POTASSIUM: 4.9 meq/L (ref 3.7–5.3)
Sodium: 139 mEq/L (ref 137–147)

## 2013-10-03 LAB — CBC
HCT: 36.9 % (ref 36.0–46.0)
Hemoglobin: 12.8 g/dL (ref 12.0–15.0)
MCH: 32.2 pg (ref 26.0–34.0)
MCHC: 34.7 g/dL (ref 30.0–36.0)
MCV: 92.7 fL (ref 78.0–100.0)
PLATELETS: 229 10*3/uL (ref 150–400)
RBC: 3.98 MIL/uL (ref 3.87–5.11)
RDW: 13.4 % (ref 11.5–15.5)
WBC: 16.1 10*3/uL — ABNORMAL HIGH (ref 4.0–10.5)

## 2013-10-03 LAB — CG4 I-STAT (LACTIC ACID): Lactic Acid, Venous: 3.36 mmol/L — ABNORMAL HIGH (ref 0.5–2.2)

## 2013-10-03 LAB — LACTIC ACID, PLASMA: LACTIC ACID, VENOUS: 2.5 mmol/L — AB (ref 0.5–2.2)

## 2013-10-03 MED ORDER — PANTOPRAZOLE SODIUM 40 MG IV SOLR
40.0000 mg | Freq: Every day | INTRAVENOUS | Status: DC
  Administered 2013-10-03: 40 mg via INTRAVENOUS
  Filled 2013-10-03 (×2): qty 40

## 2013-10-03 MED ORDER — AMLODIPINE BESYLATE 10 MG PO TABS
10.0000 mg | ORAL_TABLET | Freq: Every day | ORAL | Status: DC
  Administered 2013-10-03 – 2013-10-04 (×2): 10 mg via ORAL
  Filled 2013-10-03 (×3): qty 1

## 2013-10-03 MED ORDER — METRONIDAZOLE IN NACL 5-0.79 MG/ML-% IV SOLN
500.0000 mg | Freq: Three times a day (TID) | INTRAVENOUS | Status: DC
  Administered 2013-10-03 – 2013-10-04 (×5): 500 mg via INTRAVENOUS
  Filled 2013-10-03 (×7): qty 100

## 2013-10-03 MED ORDER — ENOXAPARIN SODIUM 40 MG/0.4ML ~~LOC~~ SOLN
40.0000 mg | SUBCUTANEOUS | Status: DC
  Administered 2013-10-03: 40 mg via SUBCUTANEOUS
  Filled 2013-10-03 (×3): qty 0.4

## 2013-10-03 MED ORDER — CIPROFLOXACIN IN D5W 400 MG/200ML IV SOLN
400.0000 mg | Freq: Two times a day (BID) | INTRAVENOUS | Status: DC
  Administered 2013-10-03 – 2013-10-04 (×3): 400 mg via INTRAVENOUS
  Filled 2013-10-03 (×6): qty 200

## 2013-10-03 MED ORDER — ONDANSETRON HCL 4 MG/2ML IJ SOLN: 4.0000 mg | Freq: Four times a day (QID) | INTRAMUSCULAR | Status: DC | PRN

## 2013-10-03 MED ORDER — IRBESARTAN 300 MG PO TABS
300.0000 mg | ORAL_TABLET | Freq: Every day | ORAL | Status: DC
  Administered 2013-10-03 – 2013-10-04 (×2): 300 mg via ORAL
  Filled 2013-10-03 (×2): qty 1

## 2013-10-03 MED ORDER — PIPERACILLIN-TAZOBACTAM 3.375 G IVPB 30 MIN: 3.3750 g | Freq: Once | INTRAVENOUS | Status: DC

## 2013-10-03 MED ORDER — METOPROLOL TARTRATE 50 MG PO TABS
50.0000 mg | ORAL_TABLET | Freq: Two times a day (BID) | ORAL | Status: DC
  Administered 2013-10-03 – 2013-10-04 (×3): 50 mg via ORAL
  Filled 2013-10-03 (×4): qty 1

## 2013-10-03 MED ORDER — SODIUM CHLORIDE 0.9 % IV SOLN
500.0000 mL | Freq: Once | INTRAVENOUS | Status: AC
  Administered 2013-10-03: 03:00:00 via INTRAVENOUS

## 2013-10-03 MED ORDER — TELMISARTAN-HCTZ 80-25 MG PO TABS: 1.0000 | ORAL_TABLET | Freq: Every day | ORAL | Status: DC

## 2013-10-03 MED ORDER — MORPHINE SULFATE 4 MG/ML IJ SOLN
4.0000 mg | Freq: Once | INTRAMUSCULAR | Status: AC
  Administered 2013-10-03: 4 mg via INTRAVENOUS
  Filled 2013-10-03: qty 1

## 2013-10-03 MED ORDER — HYDROMORPHONE HCL PF 1 MG/ML IJ SOLN: 0.5000 mg | INTRAMUSCULAR | Status: DC | PRN

## 2013-10-03 MED ORDER — KCL IN DEXTROSE-NACL 20-5-0.45 MEQ/L-%-% IV SOLN
INTRAVENOUS | Status: DC
  Administered 2013-10-03 – 2013-10-04 (×3): via INTRAVENOUS
  Filled 2013-10-03 (×7): qty 1000

## 2013-10-03 MED ORDER — HYDROCHLOROTHIAZIDE 25 MG PO TABS
25.0000 mg | ORAL_TABLET | Freq: Every day | ORAL | Status: DC
  Administered 2013-10-03 – 2013-10-04 (×2): 25 mg via ORAL
  Filled 2013-10-03 (×2): qty 1

## 2013-10-03 NOTE — ED Notes (Signed)
Returned from CT.  Awaiting results.  Pain unchanged.

## 2013-10-03 NOTE — Progress Notes (Signed)
Patient ID: Jennifer Russo, female   DOB: August 11, 1941, 73 y.o.   MRN: [id number] .    Subjective: Patient feeling better this am  No nausea  Pain essentially gone  Objective: Vital signs in last 24 hours: Temp:  [97.5 F (36.4 C)-98.5 F (36.9 C)] 98.5 F (36.9 C) (02/17 0540) Pulse Rate:  [52-72] 66 (02/17 0540) Resp:  [11-21] 20 (02/17 0540) BP: (98-133)/(40-73) 127/42 mmHg (02/17 0540) SpO2:  [93 %-98 %] 94 % (02/17 0540) Weight:  [140 lb 6.4 oz (63.685 kg)] 140 lb 6.4 oz (63.685 kg) (02/17 0341) Last BM Date: 10/02/13  Intake/Output from previous day: 02/16 0701 - 02/17 0700 In: 452.1 [I.V.:152.1; IV Piggyback:300] Out: -  Intake/Output this shift:    PE: Abd: soft, NT, ND, +Bs Heart: regular, but ? Rub vs murmur  Lab Results:   Recent Labs  10/02/13 2045 10/03/13 0502  WBC 16.0* 16.1*  HGB 13.6 12.8  HCT 38.6 36.9  PLT 287 229   BMET  Recent Labs  10/02/13 2200 10/03/13 0502  NA 139 139  K 4.4 4.9  CL 97 101  CO2 24 24  GLUCOSE 160* 136*  BUN 29* 25*  CREATININE 0.95 0.84  CALCIUM 9.4 8.6   PT/INR No results found for this basename: LABPROT, INR,  in the last 72 hours CMP     Component Value Date/Time   NA 139 10/03/2013 0502   K 4.9 10/03/2013 0502   CL 101 10/03/2013 0502   CO2 24 10/03/2013 0502   GLUCOSE 136* 10/03/2013 0502   BUN 25* 10/03/2013 0502   CREATININE 0.84 10/03/2013 0502   CALCIUM 8.6 10/03/2013 0502   PROT 7.7 10/02/2013 2200   ALBUMIN 3.7 10/02/2013 2200   AST 74* 10/02/2013 2200   ALT 76* 10/02/2013 2200   ALKPHOS 65 10/02/2013 2200   BILITOT 0.3 10/02/2013 2200   GFRNONAA 66* 10/03/2013 0502   GFRAA 77* 10/03/2013 0502   Lipase     Component Value Date/Time   LIPASE 34 10/02/2013 2200       Studies/Results: Ct Abdomen Pelvis W Contrast  10/03/2013   CLINICAL DATA:  Acute onset abdominal pain  EXAM: CT ABDOMEN AND PELVIS WITH CONTRAST  TECHNIQUE: Multidetector CT imaging of the abdomen and pelvis was performed using the  standard protocol following bolus administration of intravenous contrast.  CONTRAST:  OMNIPAQUE IOHEXOL 300 MG/ML  SOLN  COMPARISON:  US ABDOMEN COMPLETE dated 08/07/2013  FINDINGS: There is bronchiectasis at the lung bases.  No pericardial fluid.  No focal hepatic lesion. The gallbladder, pancreas, spleen, adrenal glands, and kidneys are normal.  The stomach and proximal small bowel are normal. There is a segment of distal small bowel that is mildly inflamed (image 65, series 2). This loops of small bowel is filled with stool suggesting enteric stasis and fecalization. There is small amount of gas within the mesentery adjacent to these loops of bowel inflamed bowel (image 62, series 2). Potential tiny locule of gas within the portal vein (image 23, series 2). More distally the small bowel is collapsed in the terminal ileum is normal. The appendix is normal. The colon rectosigmoid colon are collapsed  Abdominal aorta is normal caliber. There is 6 extensive atherosclerotic disease of the aorta is branches. The celiac trunk and SMA are patent. No retroperitoneal periportal lymphadenopathy.  No free fluid the pelvis. No intraperitoneal free air or abscess. No retroperitoneal periportal lymphadenopathy.  No free fluid the pelvis. The uterus and ovaries are the  bladder normal. No aggressive osseous lesion.  IMPRESSION: 1. Segment of distal small bowel with fecalization of enteric contents and mild inflammation of the bowel wall and associated mesentery. Trace amount of gas with mesenteric vessels associated with this inflamed with bowel. Findings are concerning for a segment of ischemic distal small bowel. 2. Appendix is normal. 3. Colon is collapsed. Findings conveyed toCATHERINE SCHINLEVER on 10/03/2013  at00:47.   Electronically Signed   By: Genevive BiStewart  Edmunds M.D.   On: 10/03/2013 00:48    Anti-infectives: Anti-infectives   Start     Dose/Rate Route Frequency Ordered Stop   10/03/13 0215  ciprofloxacin  (CIPRO) IVPB 400 mg     400 mg 200 mL/hr over 60 Minutes Intravenous Every 12 hours 10/03/13 0205     10/03/13 0215  metroNIDAZOLE (FLAGYL) IVPB 500 mg     500 mg 100 mL/hr over 60 Minutes Intravenous Every 8 hours 10/03/13 0205     10/03/13 0100  piperacillin-tazobactam (ZOSYN) IVPB 3.375 g  Status:  Discontinued     3.375 g 100 mL/hr over 30 Minutes Intravenous  Once 10/03/13 0050 10/03/13 0104       Assessment/Plan  1. Abdominal pain, n/v  Plan: 1. Cont NPO for now, if better this afternoon still may consider clear liquids 2. Recheck CBC in am   LOS: 1 day    Anvika Gashi E 10/03/2013, 9:46 AM Pager: [phone]

## 2013-10-03 NOTE — H&P (Signed)
Jennifer Russo is an 73 y.o. female.   Chief Complaint: Lower abdominal pain. HPI: The patient has been having exquisite lower abdominal pain for the past 24 hours. It is currently much better because she is receiving some pain medication in the emergency department, however her CT scan of the abdomen and pelvis demonstrates the visualization of the small bowel was the possibility of mesenteric area and small bowel ischemia.  The patient has never had pain like this before. She entered the emergency department her pain was a 10 out of 10. Currently it is a 4/10 however she has received some pain medication recently.  At home the patient had diarrhea and nausea vomiting. This was intractable for some time. She comes in now for evaluation.  Past Medical History  Diagnosis Date  . CAD (coronary artery disease)     s/p PCI- LAD and RCA with drug-eluting stents, PTCA in 2009 to treat in stent restenosis in RCA  . Essential hypertension   . HLD (hyperlipidemia)   . Asymptomatic carotid artery stenosis     Past Surgical History  Procedure Laterality Date  . Coronary angioplasty  2006    Family History  Problem Relation Age of Onset  . Hypertension Mother   . Diabetes Father    Social History:  reports that she has never smoked. She does not have any smokeless tobacco history on file. She reports that she does not drink alcohol or use illicit drugs.  Allergies:  Allergies  Allergen Reactions  . Penicillins   . Sulfonamide Derivatives      (Not in a hospital admission)  Results for orders placed during the hospital encounter of 10/02/13 (from the past 48 hour(s))  CBC WITH DIFFERENTIAL     Status: Abnormal   Collection Time    10/02/13  8:45 PM      Result Value Ref Range   WBC 16.0 (*) 4.0 - 10.5 K/uL   RBC 4.18  3.87 - 5.11 MIL/uL   Hemoglobin 13.6  12.0 - 15.0 g/dL   HCT 38.6  36.0 - 46.0 %   MCV 92.3  78.0 - 100.0 fL   MCH 32.5  26.0 - 34.0 pg   MCHC 35.2  30.0 - 36.0 g/dL    RDW 13.4  11.5 - 15.5 %   Platelets 287  150 - 400 K/uL   Neutrophils Relative % 86 (*) 43 - 77 %   Neutro Abs 13.7 (*) 1.7 - 7.7 K/uL   Lymphocytes Relative 11 (*) 12 - 46 %   Lymphs Abs 1.7  0.7 - 4.0 K/uL   Monocytes Relative 3  3 - 12 %   Monocytes Absolute 0.5  0.1 - 1.0 K/uL   Eosinophils Relative 0  0 - 5 %   Eosinophils Absolute 0.0  0.0 - 0.7 K/uL   Basophils Relative 0  0 - 1 %   Basophils Absolute 0.0  0.0 - 0.1 K/uL  POCT I-STAT TROPONIN I     Status: None   Collection Time    10/02/13  9:03 PM      Result Value Ref Range   Troponin i, poc 0.01  0.00 - 0.08 ng/mL   Comment 3            Comment: Due to the release kinetics of cTnI,     a negative result within the first hours     of the onset of symptoms does not rule out     myocardial infarction  with certainty.     If myocardial infarction is still suspected,     repeat the test at appropriate intervals.  COMPREHENSIVE METABOLIC PANEL     Status: Abnormal   Collection Time    10/02/13 10:00 PM      Result Value Ref Range   Sodium 139  137 - 147 mEq/L   Potassium 4.4  3.7 - 5.3 mEq/L   Comment: HEMOLYSIS AT THIS LEVEL MAY AFFECT RESULT   Chloride 97  96 - 112 mEq/L   CO2 24  19 - 32 mEq/L   Glucose, Bld 160 (*) 70 - 99 mg/dL   BUN 29 (*) 6 - 23 mg/dL   Creatinine, Ser 0.95  0.50 - 1.10 mg/dL   Calcium 9.4  8.4 - 10.5 mg/dL   Total Protein 7.7  6.0 - 8.3 g/dL   Albumin 3.7  3.5 - 5.2 g/dL   AST 74 (*) 0 - 37 U/L   Comment: HEMOLYSIS AT THIS LEVEL MAY AFFECT RESULT   ALT 76 (*) 0 - 35 U/L   Comment: HEMOLYSIS AT THIS LEVEL MAY AFFECT RESULT   Alkaline Phosphatase 65  39 - 117 U/L   Total Bilirubin 0.3  0.3 - 1.2 mg/dL   GFR calc non Af Amer 57 (*) >90 mL/min   GFR calc Af Amer 66 (*) >90 mL/min   Comment: (NOTE)     The eGFR has been calculated using the CKD EPI equation.     This calculation has not been validated in all clinical situations.     eGFR's persistently <90 mL/min signify possible Chronic  Kidney     Disease.  LIPASE, BLOOD     Status: None   Collection Time    10/02/13 10:00 PM      Result Value Ref Range   Lipase 34  11 - 59 U/L  CG4 I-STAT (LACTIC ACID)     Status: Abnormal   Collection Time    10/03/13  1:26 AM      Result Value Ref Range   Lactic Acid, Venous 3.36 (*) 0.5 - 2.2 mmol/L   Ct Abdomen Pelvis W Contrast  10/03/2013   CLINICAL DATA:  Acute onset abdominal pain  EXAM: CT ABDOMEN AND PELVIS WITH CONTRAST  TECHNIQUE: Multidetector CT imaging of the abdomen and pelvis was performed using the standard protocol following bolus administration of intravenous contrast.  CONTRAST:  154m OMNIPAQUE IOHEXOL 300 MG/ML  SOLN  COMPARISON:  [place] dated 08/07/2013  FINDINGS: There is bronchiectasis at the lung bases.  No pericardial fluid.  No focal hepatic lesion. The gallbladder, pancreas, spleen, adrenal glands, and kidneys are normal.  The stomach and proximal small bowel are normal. There is a segment of distal small bowel that is mildly inflamed (image 65, series 2). This loops of small bowel is filled with stool suggesting enteric stasis and fecalization. There is small amount of gas within the mesentery adjacent to these loops of bowel inflamed bowel (image 62, series 2). Potential tiny locule of gas within the portal vein (image 23, series 2). More distally the small bowel is collapsed in the terminal ileum is normal. The appendix is normal. The colon rectosigmoid colon are collapsed  Abdominal aorta is normal caliber. There is 6 extensive atherosclerotic disease of the aorta is branches. The celiac trunk and SMA are patent. No retroperitoneal periportal lymphadenopathy.  No free fluid the pelvis. No intraperitoneal free air or abscess. No retroperitoneal periportal lymphadenopathy.  No free fluid  the pelvis. The uterus and ovaries are the bladder normal. No aggressive osseous lesion.  IMPRESSION: 1. Segment of distal small bowel with fecalization of enteric  contents and mild inflammation of the bowel wall and associated mesentery. Trace amount of gas with mesenteric vessels associated with this inflamed with bowel. Findings are concerning for a segment of ischemic distal small bowel. 2. Appendix is normal. 3. Colon is collapsed. Findings conveyed toCATHERINE SCHINLEVER on 10/03/2013  at00:47.   Electronically Signed   By: Suzy Bouchard M.D.   On: 10/03/2013 00:48    Review of Systems  Constitutional: Negative for fever and chills.  HENT: Negative.   Gastrointestinal: Positive for nausea, vomiting and abdominal pain.  Genitourinary: Negative.   Musculoskeletal: Negative.   Skin: Negative.   Neurological: Negative.   Endo/Heme/Allergies: Does not bruise/bleed easily.  Psychiatric/Behavioral: Negative.     Blood pressure 129/44, pulse 66, temperature 97.5 F (36.4 C), temperature source Oral, resp. rate 14, SpO2 93.00%. Physical Exam  Constitutional: She is oriented to person, place, and time. She appears well-developed and well-nourished.  HENT:  Head: Normocephalic and atraumatic.  Right Ear: External ear normal.  Left Ear: External ear normal.  Eyes: Conjunctivae and EOM are normal. Pupils are equal, round, and reactive to light.  Neck: Normal range of motion. Neck supple.  Cardiovascular: Normal rate, regular rhythm and intact distal pulses.   No murmur heard. Respiratory: Effort normal and breath sounds normal.  GI: Soft. Normal appearance and bowel sounds are normal. There is tenderness (mild periumbilical) in the periumbilical area and suprapubic area. There is no rigidity, no rebound and no guarding. Hernia confirmed negative in the right inguinal area and confirmed negative in the left inguinal area.  Musculoskeletal: Normal range of motion.  Neurological: She is alert and oriented to person, place, and time. She has normal reflexes.  Skin: Skin is warm and dry.  Psychiatric: She has a normal mood and affect. Her behavior is  normal. Judgment and thought content normal.     Assessment/Plan Acute abdominal pain of unknown etiology. Her CT scan suggests equalization of part of the small bowel with some questionable air in the mesenteric vessels. The patient has a lactic acidosis, and a moderate leukocytosis. She's had some significant nausea and vomiting associated with abdominal discomfort and also some diarrhea.  Her pain is difficult to assess because it has abated in light of receiving some parenteral narcotics. However objectively she is no peritonitis, and she has normal active bowel sounds.  The patient has had no prior surgery and therefore the likelihood of a strangulating small bowel obstruction is not very high. I have seen people.with severe gastroenteritis developed symptoms like this, significant lactic acidosis, and a leukocytosis.  The patient needs to be admitted and placed on intravenous antibiotics and also rehydrated. All give her a light dose of intravenous narcotics for pain control hoping that as her symptoms improve as we did an indication that her condition is improving. Currently I do not believe as though she requires an exploratory laparotomy however are really checked the patient prior to the morning.  Gwenyth Ober 10/03/2013, 1:41 AM

## 2013-10-03 NOTE — ED Notes (Signed)
Assisted pt up to BR to void.  Tolerated activity well.

## 2013-10-03 NOTE — ED Notes (Signed)
Patient transported to CT 

## 2013-10-03 NOTE — ED Notes (Signed)
Dr Wyatt at bedside.  

## 2013-10-03 NOTE — ED Notes (Signed)
Surgeon in room  °

## 2013-10-03 NOTE — ED Notes (Signed)
Report to [facility] RN on 6N.  Pt to go to floor on tele with EDT accompanying.

## 2013-10-03 NOTE — Progress Notes (Signed)
States pain practically gone. No n/v. Didn't require pain meds overnight.   Alert, walking in room, non-toxic Soft, nd. Essentially NT. Perhaps subtle TTP in lower abd/LLQ; certainly no RT/guarding  Repeat abd exam this afternoon If still ok - PO challenge  Jennifer SellaEric M. Andrey CampanileWilson, MD, FACS General, Bariatric, & Minimally Invasive [facility] [place]

## 2013-10-03 NOTE — ED Notes (Signed)
Catherine, PA at bedside

## 2013-10-04 LAB — CBC
HEMATOCRIT: 33.6 % — AB (ref 36.0–46.0)
HEMOGLOBIN: 11.6 g/dL — AB (ref 12.0–15.0)
MCH: 32 pg (ref 26.0–34.0)
MCHC: 34.5 g/dL (ref 30.0–36.0)
MCV: 92.8 fL (ref 78.0–100.0)
Platelets: 182 10*3/uL (ref 150–400)
RBC: 3.62 MIL/uL — ABNORMAL LOW (ref 3.87–5.11)
RDW: 13.5 % (ref 11.5–15.5)
WBC: 12.3 10*3/uL — AB (ref 4.0–10.5)

## 2013-10-04 MED ORDER — METRONIDAZOLE 500 MG PO TABS: 500.0000 mg | ORAL_TABLET | Freq: Three times a day (TID) | ORAL | Status: AC

## 2013-10-04 MED ORDER — CIPROFLOXACIN HCL 500 MG PO TABS: 500.0000 mg | ORAL_TABLET | Freq: Two times a day (BID) | ORAL | Status: AC

## 2013-10-04 NOTE — Discharge Instructions (Signed)
Should you abdominal pain worsen please call your primary care provider.  Please be sure to take your antibiotics for 5 days.

## 2013-10-04 NOTE — Progress Notes (Signed)
Pt seen and examined.  Denies abd pain. No n/v  Soft, nt, nd  If tolerates diet without issue, will d/c today Pt instructed to call or return to ED for recurrence of symptoms rec she f/u with PCP   Mary SellaEric M. Andrey CampanileWilson, MD, FACS General, Bariatric, & Minimally Invasive Surgery [facility] [place]

## 2013-10-04 NOTE — Progress Notes (Signed)
Pt ate 50% of soft diet lunch tray, pt has had no nausea or pain after eating. Pt feels ready to go home. Spoke with Emina,NP at this time and informed her of pt's meal tolerance, pt okay to go home.

## 2013-10-04 NOTE — Progress Notes (Signed)
Subjective: tolerated clears, passing flatus.  Abdominal pain n/v resolved.    Objective: Vital signs in last 24 hours: Temp:  [98.7 F (37.1 C)-99.5 F (37.5 C)] 98.7 F (37.1 C) (02/18 0500) Pulse Rate:  [62-67] 65 (02/18 0500) Resp:  [19-20] 20 (02/18 0500) BP: (112-119)/(38-42) 114/38 mmHg (02/18 0500) SpO2:  [92 %-94 %] 94 % (02/18 0500) Last BM Date: 10/02/13  Intake/Output from previous day:   Intake/Output this shift: Total I/O In: 720 [P.O.:720] Out: -   PE General appearance: alert, cooperative and no distress GI: soft, non-tender; bowel sounds normal; no masses,  no organomegaly  Lab Results:   Recent Labs  10/03/13 0502 10/04/13 0630  WBC 16.1* 12.3*  HGB 12.8 11.6*  HCT 36.9 33.6*  PLT 229 182   BMET  Recent Labs  10/02/13 2200 10/03/13 0502  NA 139 139  K 4.4 4.9  CL 97 101  CO2 24 24  GLUCOSE 160* 136*  BUN 29* 25*  CREATININE 0.95 0.84  CALCIUM 9.4 8.6   PT/INR No results found for this basename: LABPROT, INR,  in the last 72 hours ABG  Recent Labs  10/03/13 0214  PHART 7.421  HCO3 25.6*    Studies/Results: Ct Abdomen Pelvis W Contrast  10/03/2013   CLINICAL DATA:  Acute onset abdominal pain  EXAM: CT ABDOMEN AND PELVIS WITH CONTRAST  TECHNIQUE: Multidetector CT imaging of the abdomen and pelvis was performed using the standard protocol following bolus administration of intravenous contrast.  CONTRAST:  100mL OMNIPAQUE IOHEXOL 300 MG/ML  SOLN  COMPARISON:  US ABDOMEN COMPLETE dated 08/07/2013  FINDINGS: There is bronchiectasis at the lung bases.  No pericardial fluid.  No focal hepatic lesion. The gallbladder, pancreas, spleen, adrenal glands, and kidneys are normal.  The stomach and proximal small bowel are normal. There is a segment of distal small bowel that is mildly inflamed (image 65, series 2). This loops of small bowel is filled with stool suggesting enteric stasis and fecalization. There is small amount of gas within the  mesentery adjacent to these loops of bowel inflamed bowel (image 62, series 2). Potential tiny locule of gas within the portal vein (image 23, series 2). More distally the small bowel is collapsed in the terminal ileum is normal. The appendix is normal. The colon rectosigmoid colon are collapsed  Abdominal aorta is normal caliber. There is 6 extensive atherosclerotic disease of the aorta is branches. The celiac trunk and SMA are patent. No retroperitoneal periportal lymphadenopathy.  No free fluid the pelvis. No intraperitoneal free air or abscess. No retroperitoneal periportal lymphadenopathy.  No free fluid the pelvis. The uterus and ovaries are the bladder normal. No aggressive osseous lesion.  IMPRESSION: 1. Segment of distal small bowel with fecalization of enteric contents and mild inflammation of the bowel wall and associated mesentery. Trace amount of gas with mesenteric vessels associated with this inflamed with bowel. Findings are concerning for a segment of ischemic distal small bowel. 2. Appendix is normal. 3. Colon is collapsed. Findings conveyed toCATHERINE SCHINLEVER on 10/03/2013  at00:47.   Electronically Signed   By: Genevive BiStewart  Edmunds M.D.   On: 10/03/2013 00:48    Anti-infectives: Anti-infectives   Start     Dose/Rate Route Frequency Ordered Stop   10/04/13 0000  ciprofloxacin (CIPRO) 500 MG tablet     500 mg Oral 2 times daily 10/04/13 1056 10/09/13 2359   10/04/13 0000  metroNIDAZOLE (FLAGYL) 500 MG tablet     500 mg Oral 3 times  daily 10/04/13 1056 10/09/13 2359   10/03/13 0215  ciprofloxacin (CIPRO) IVPB 400 mg     400 mg 200 mL/hr over 60 Minutes Intravenous Every 12 hours 10/03/13 0205     10/03/13 0215  metroNIDAZOLE (FLAGYL) IVPB 500 mg     500 mg 100 mL/hr over 60 Minutes Intravenous Every 8 hours 10/03/13 0205     10/03/13 0100  piperacillin-tazobactam (ZOSYN) IVPB 3.375 g  Status:  Discontinued     3.375 g 100 mL/hr over 30 Minutes Intravenous  Once 10/03/13 0050  10/03/13 0104      Assessment/Plan: abdominal pain, n/v Resolved, white count down.  Advance to bland diet.  If she is able to tolerate discharge home today.  We discussed warning signs that warrant immediate attention.  cipro flagyl for total of 7 days.  Follow up with PCP    LOS: 2 days    Joda Braatz ANP-BC 10/04/2013 10:59 AM

## 2013-10-09 NOTE — Discharge Summary (Signed)
Physician Discharge Summary  Jennifer Russo:[id number] DOB: May 09, 1941 DOA: 10/02/2013  PCP: Jennifer Patch, MD  Consultation: none  Admit date: 10/02/2013 Discharge date: 10/04/2013  Recommendations for Outpatient Follow-up:   Follow-up Information   Follow up with Jennifer Patch, MD.   Specialty:  Internal Medicine   Contact information:   [address], [address] [place] [address] [phone]      Discharge Diagnoses:  1. Abdominal pain 2. Nausea/vomiting 3. Leukocytosis    Surgical Procedure: none  Discharge Condition: stable Disposition: home, self care  Diet recommendation: regular  Filed Weights   10/03/13 0341  Weight: 140 lb 6.4 oz (63.685 kg)     Filed Vitals:   10/04/13 1344  BP: 108/49  Pulse: 68  Temp: 99.2 F (37.3 C)  Resp: 18     Hospital Course:  Jennifer Russo presented to [facility] with lower abdominal pain x24 hours.  CT scan of abdomen demonstrated the possibility of mesenteric area and small bowel ischemia.  She was admitted for observation, placed on antibiotics, IV hydration and bowel rest.  She felt better the following day, but was kept overnight due to a white count.  On HD#2.  The patient was tolerating a diet, white count had decreased, tolerating a diet.  She was therefore felt stable for discharge.  She was placed on additional antibiotics to finish her course.  She was advised to seek immediate attention should her symptoms worsen.  She was advised to follow up with primary care.  Follow up with [facility] as needed.     Discharge Instructions   Future Appointments Provider Department Dept Phone   01/30/2014 1:00 PM [facility] [address] [phone]   01/30/2014 2:00 PM Carma Lair Nickel, NP [facility] [phone]       Medication List         amLODipine 10 MG tablet  Commonly known as:  NORVASC  Take 10 mg by mouth daily.     ASPIR-81 81 MG EC tablet  Generic drug:   aspirin  Take 162 mg by mouth every evening.     Biotin 5000 MCG Caps  Take by mouth. daily     CALCIUM 600 600 MG Tabs tablet  Generic drug:  calcium carbonate  Take 600 mg by mouth 2 (two) times daily with a meal.     ciprofloxacin 500 MG tablet  Commonly known as:  CIPRO  Take 1 tablet (500 mg total) by mouth 2 (two) times daily.     Fish Oil 1000 MG Caps  Take 1,000 mg by mouth daily.     levothyroxine 50 MCG tablet  Commonly known as:  SYNTHROID, LEVOTHROID  Take 50 mcg by mouth daily.     metoprolol 50 MG tablet  Commonly known as:  LOPRESSOR  Take 50 mg by mouth 2 (two) times daily.     metroNIDAZOLE 500 MG tablet  Commonly known as:  FLAGYL  Take 1 tablet (500 mg total) by mouth 3 (three) times daily.     MULTIVITAL tablet  Take 1 tablet by mouth daily.     PLAVIX 75 MG tablet  Generic drug:  clopidogrel  Take 75 mg by mouth daily.     rosuvastatin 10 MG tablet  Commonly known as:  CRESTOR  Take 10 mg by mouth daily.     telmisartan-hydrochlorothiazide 80-25 MG per tablet  Commonly known as:  MICARDIS HCT  Take 1 tablet by mouth daily.  vitamin C 1000 MG tablet  Take 1,000 mg by mouth daily.     Vitamin D 2000 UNITS Caps  Take 2,000 Units by mouth daily.           Follow-up Information   Follow up with Jennifer PatchPANG,RICHARD, MD.   Specialty:  Internal Medicine   Contact information:   [address], [address] [place] [id number] [phone]        The results of significant diagnostics from this hospitalization (including imaging, microbiology, ancillary and laboratory) are listed below for reference.    Significant Diagnostic Studies: Ct Abdomen Pelvis W Contrast  10/03/2013   CLINICAL DATA:  Acute onset abdominal pain  EXAM: CT ABDOMEN AND PELVIS WITH CONTRAST  TECHNIQUE: Multidetector CT imaging of the abdomen and pelvis was performed using the standard protocol following bolus administration of intravenous contrast.  CONTRAST:   100mL OMNIPAQUE IOHEXOL 300 MG/ML  SOLN  COMPARISON:  US ABDOMEN COMPLETE dated 08/07/2013  FINDINGS: There is bronchiectasis at the lung bases.  No pericardial fluid.  No focal hepatic lesion. The gallbladder, pancreas, spleen, adrenal glands, and kidneys are normal.  The stomach and proximal small bowel are normal. There is a segment of distal small bowel that is mildly inflamed (image 65, series 2). This loops of small bowel is filled with stool suggesting enteric stasis and fecalization. There is small amount of gas within the mesentery adjacent to these loops of bowel inflamed bowel (image 62, series 2). Potential tiny locule of gas within the portal vein (image 23, series 2). More distally the small bowel is collapsed in the terminal ileum is normal. The appendix is normal. The colon rectosigmoid colon are collapsed  Abdominal aorta is normal caliber. There is 6 extensive atherosclerotic disease of the aorta is branches. The celiac trunk and SMA are patent. No retroperitoneal periportal lymphadenopathy.  No free fluid the pelvis. No intraperitoneal free air or abscess. No retroperitoneal periportal lymphadenopathy.  No free fluid the pelvis. The uterus and ovaries are the bladder normal. No aggressive osseous lesion.  IMPRESSION: 1. Segment of distal small bowel with fecalization of enteric contents and mild inflammation of the bowel wall and associated mesentery. Trace amount of gas with mesenteric vessels associated with this inflamed with bowel. Findings are concerning for a segment of ischemic distal small bowel. 2. Appendix is normal. 3. Colon is collapsed. Findings conveyed toCATHERINE SCHINLEVER on 10/03/2013  at00:47.   Electronically Signed   By: Genevive BiStewart  Edmunds M.D.   On: 10/03/2013 00:48    Microbiology: No results found for this or any previous visit (from the past 240 hour(s)).   Labs: Basic Metabolic Panel:  Recent Labs Lab 10/02/13 2200 10/03/13 0502  NA 139 139  K 4.4 4.9  CL 97  101  CO2 24 24  GLUCOSE 160* 136*  BUN 29* 25*  CREATININE 0.95 0.84  CALCIUM 9.4 8.6   Liver Function Tests:  Recent Labs Lab 10/02/13 2200  AST 74*  ALT 76*  ALKPHOS 65  BILITOT 0.3  PROT 7.7  ALBUMIN 3.7    Recent Labs Lab 10/02/13 2200  LIPASE 34   No results found for this basename: AMMONIA,  in the last 168 hours CBC:  Recent Labs Lab 10/02/13 2045 10/03/13 0502 10/04/13 0630  WBC 16.0* 16.1* 12.3*  NEUTROABS 13.7*  --   --   HGB 13.6 12.8 11.6*  HCT 38.6 36.9 33.6*  MCV 92.3 92.7 92.8  PLT 287 229 182    Active Problems:  Ischemic bowel disease   Signed:  Mayrani Khamis, ANP-BC

## 2013-10-10 NOTE — Discharge Summary (Signed)
Jennifer Russo M. Aroura Vasudevan, MD, FACS General, Bariatric, & Minimally Invasive Surgery [facility]  

## 2013-12-04 ENCOUNTER — Encounter: Payer: Self-pay | Admitting: Cardiovascular Disease

## 2014-01-29 ENCOUNTER — Encounter: Payer: Self-pay | Admitting: Family

## 2014-01-30 ENCOUNTER — Ambulatory Visit (INDEPENDENT_AMBULATORY_CARE_PROVIDER_SITE_OTHER): Payer: Medicare Other | Admitting: Family

## 2014-01-30 ENCOUNTER — Encounter: Payer: Self-pay | Admitting: Family

## 2014-01-30 ENCOUNTER — Ambulatory Visit (HOSPITAL_COMMUNITY)
Admission: RE | Admit: 2014-01-30 | Discharge: 2014-01-30 | Disposition: A | Discharge status: Home / Self Care | Payer: Medicare Other | Source: Ambulatory Visit | Attending: Family | Admitting: Family

## 2014-01-30 VITALS — BP 134/61 | HR 52 | Resp 16 | Ht 63.0 in | Wt 136.0 lb

## 2014-01-30 DIAGNOSIS — I6529 Occlusion and stenosis of unspecified carotid artery: Secondary | ICD-10-CM

## 2014-01-30 NOTE — Progress Notes (Signed)
Established Carotid Patient   History of Present Illness  Jennifer Russo is a 73 y.o. female patient of Dr. Arbie Cookey who has known carotid stenosis. She returns today for follow up.  Patient has not had previous carotid artery intervention.  Patient has Negative history of TIA or stroke symptom.  The patient denies amaurosis fugax or monocular blindness.  The patient  denies facial drooping.  Pt. denies hemiplegia.  The patient denies receptive or expressive aphasia.  Pt. denies extremity weakness.  She has CAD with 2 stents, denies history of MI. She denies claudication symptoms in her legs with walking, denies non healing wounds.   Pt denies New Medical or Surgical History.  Pt Diabetic: No Pt smoker: non-smoker She exercises 4 days/week, does not use ETOH.  Pt meds include: Statin : Yes ASA: Yes Other anticoagulants/antiplatelets: Plavix   Past Medical History  Diagnosis Date  . CAD (coronary artery disease)     s/p PCI- LAD and RCA with drug-eluting stents, PTCA in 2009 to treat in stent restenosis in RCA  . Essential hypertension   . HLD (hyperlipidemia)   . Asymptomatic carotid artery stenosis     Social History History  Substance Use Topics  . Smoking status: Never Smoker   . Smokeless tobacco: Never Used  . Alcohol Use: No    Family History Family History  Problem Relation Age of Onset  . Hypertension Mother   . Diabetes Father     Surgical History Past Surgical History  Procedure Laterality Date  . Coronary angioplasty  03-30-05 / 04-14-05  . Femoral artery repair Right 07-03-08    Groin area    Allergies  Allergen Reactions  . Penicillins   . Sulfonamide Derivatives     Current Outpatient Prescriptions  Medication Sig Dispense Refill  . amLODipine (NORVASC) 10 MG tablet Take 10 mg by mouth daily.      . Ascorbic Acid (VITAMIN C) 1000 MG tablet Take 1,000 mg by mouth daily.        Marland Kitchen aspirin (ASPIR-81) 81 MG EC tablet Take 162 mg by mouth every  evening.       . calcium carbonate (CALCIUM 600) 600 MG TABS Take 600 mg by mouth 2 (two) times daily with a meal.      . Cholecalciferol (VITAMIN D) 2000 UNITS CAPS Take 2,000 Units by mouth daily.      . clopidogrel (PLAVIX) 75 MG tablet Take 75 mg by mouth daily.        Marland Kitchen levothyroxine (SYNTHROID, LEVOTHROID) 50 MCG tablet Take 50 mcg by mouth daily.      . metoprolol (LOPRESSOR) 50 MG tablet Take 50 mg by mouth 2 (two) times daily.        . Multiple Vitamins-Minerals (MULTIVITAL) tablet Take 1 tablet by mouth daily.        . Omega-3 Fatty Acids (FISH OIL) 1000 MG CAPS Take 1,000 mg by mouth daily.       . rosuvastatin (CRESTOR) 10 MG tablet Take 10 mg by mouth daily.      Marland Kitchen telmisartan-hydrochlorothiazide (MICARDIS HCT) 80-25 MG per tablet Take 1 tablet by mouth daily.      . Biotin 5000 MCG CAPS Take by mouth. daily       No current facility-administered medications for this visit.    Review of Systems : See HPI for pertinent positives and negatives.  Physical Examination   Filed Vitals:   01/30/14 1427 01/30/14 1430  BP: 127/64 134/61  Pulse: 54  52  Resp:  16  Height:  5\' 3"  (1.6 m)  Weight:  136 lb (61.689 kg)  SpO2:  98%   Body mass index is 24.1 kg/(m^2).  General: WDWN female in NAD GAIT: normal Eyes: PERRLA Pulmonary:  Non-labored, CTAB, Negative  Rales, Negative rhonchi, & Negative wheezing.  Cardiac: regular Rhythm ,  Negative detected murmur.  VASCULAR EXAM Carotid Bruits Left Right   Positive Negative    Aorta is not palpable. Radial pulses are 2+ palpable and equal.                                                                                                                            LE Pulses LEFT RIGHT       POPLITEAL  not palpable   not palpable       POSTERIOR TIBIAL   palpable    palpable        DORSALIS PEDIS      ANTERIOR TIBIAL  palpable   palpable     Gastrointestinal: soft, nontender, BS WNL, no r/g,  negative  masses.  Musculoskeletal: Negative muscle atrophy/wasting. M/S 5/5 throughout, Extremities without ischemic changes.  Neurologic: A&O X 3; Appropriate Affect ; SENSATION ;normal;  Speech is normal CN 2-12 intact, Pain and light touch intact in extremities, Motor exam as listed above.   Non-Invasive Vascular Imaging CAROTID DUPLEX 01/30/2014   CEREBROVASCULAR DUPLEX EVALUATION    INDICATION: Carotid disease    PREVIOUS INTERVENTION(S):     DUPLEX EXAM:     RIGHT  LEFT  Peak Systolic Velocities (cm/s) End Diastolic Velocities (cm/s) Plaque LOCATION Peak Systolic Velocities (cm/s) End Diastolic Velocities (cm/s) Plaque  84 15  CCA PROXIMAL 105 16   89 17  CCA MID 87 12   67/165 17/22 HT CCA DISTAL 134 18 HT  160 8 HT ECA 200 0 HT  166 28 HT ICA PROXIMAL 98 13 HT  107 30  ICA MID 88 18   87 26  ICA DISTAL 84 14     Not Calculated ICA / CCA Ratio (PSV) 0.73  Antegrade Vertebral Flow Antegrade   Brachial Systolic Pressure (mmHg)   Multiphasic (subclavian artery) Brachial Artery Waveforms Multiphasic (subclavian artery)    Plaque Morphology:  HM = Homogeneous, HT = Heterogeneous, CP = Calcific Plaque, SP = Smooth Plaque, IP = Irregular Plaque     ADDITIONAL FINDINGS:   No significant stenosis of the right external or left common carotid arteries.   Left external carotid artery stenosis noted.    IMPRESSION: Doppler velocities suggest less than 40% stenoses of the bilateral proximal internal carotid arteries and a greater than 50% stenosis of the right distal common carotid artery.    Compared to the previous exam:  Increase in the velocity of the right distal common carotid artery when compared to the previous exam on 01/27/13 with the bilateral internal carotid arteries remaining stable.        Assessment: Jennifer Russo is a 73 y.o. female who presents with asymptomatic less than 40% stenoses of the bilateral proximal internal carotid arteries and a greater than 50% stenosis  of the right distal common carotid artery. Increase in the velocity of the right distal common carotid artery when compared to the previous exam on 01/27/13 with the bilateral internal carotid arteries remaining stable.  She exercises for 1.5 hours, 4 days/week, has never smoked, does not have DM.  Plan: Based on today's exam and Duplex results, and after discussing with Dr. Arbie CookeyEarly, follow-up in 1 year with Carotid Duplex scan.   I discussed in depth with the patient the nature of atherosclerosis, and emphasized the importance of maximal medical management including strict control of blood pressure, blood glucose, and lipid levels, obtaining regular exercise, and continued cessation of smoking.  The patient is aware that without maximal medical management the underlying atherosclerotic disease process will progress, limiting the benefit of any interventions. The patient was given information about stroke prevention and what symptoms should prompt the patient to seek immediate medical care. Thank you for allowing us to participate in this patient's care.  Charisse MarchSuzanne Nickel, RN, MSN, FNP-C [facility] [place] Office: [phone]  Clinic Physician: Early  01/30/2014 2:37 PM

## 2014-01-30 NOTE — Addendum Note (Signed)
Addended by: Sharee PimpleMCCHESNEY, MARILYN K on: 01/30/2014 03:51 PM   Modules accepted: Orders

## 2014-01-30 NOTE — Patient Instructions (Signed)
Stroke Prevention Some medical conditions and behaviors are associated with an increased chance of having a stroke. You may prevent a stroke by making healthy choices and managing medical conditions. HOW CAN I REDUCE MY RISK OF HAVING A STROKE?   Stay physically active. Get at least 30 minutes of activity on most or all days.  Do not smoke. It may also be helpful to avoid exposure to secondhand smoke.  Limit alcohol use. Moderate alcohol use is considered to be:  No more than 2 drinks per day for men.  No more than 1 drink per day for nonpregnant women.  Eat healthy foods. This involves  Eating 5 or more servings of fruits and vegetables a day.  Following a diet that addresses high blood pressure (hypertension), high cholesterol, diabetes, or obesity.  Manage your cholesterol levels.  A diet low in saturated fat, trans fat, and cholesterol and high in fiber may control cholesterol levels.  Take any prescribed medicines to control cholesterol as directed by your health care Datrell Dunton.  Manage your diabetes.  A controlled-carbohydrate, controlled-sugar diet is recommended to manage diabetes.  Take any prescribed medicines to control diabetes as directed by your health care Dariya Gainer.  Control your hypertension.  A low-salt (sodium), low-saturated fat, low-trans fat, and low-cholesterol diet is recommended to manage hypertension.  Take any prescribed medicines to control hypertension as directed by your health care Atoya Andrew.  Maintain a healthy weight.  A reduced-calorie, low-sodium, low-saturated fat, low-trans fat, low-cholesterol diet is recommended to manage weight.  Stop drug abuse.  Avoid taking birth control pills.  Talk to your health care Jenisa Monty about the risks of taking birth control pills if you are over 35 years old, smoke, get migraines, or have ever had a blood clot.  Get evaluated for sleep disorders (sleep apnea).  Talk to your health care Irby Fails about  getting a sleep evaluation if you snore a lot or have excessive sleepiness.  Take medicines as directed by your health care Richell Corker.  For some people, aspirin or blood thinners (anticoagulants) are helpful in reducing the risk of forming abnormal blood clots that can lead to stroke. If you have the irregular heart rhythm of atrial fibrillation, you should be on a blood thinner unless there is a good reason you cannot take them.  Understand all your medicine instructions.  Make sure that other other conditions (such as anemia or atherosclerosis) are addressed. SEEK IMMEDIATE MEDICAL CARE IF:   You have sudden weakness or numbness of the face, arm, or leg, especially on one side of the body.  Your face or eyelid droops to one side.  You have sudden confusion.  You have trouble speaking (aphasia) or understanding.  You have sudden trouble seeing in one or both eyes.  You have sudden trouble walking.  You have dizziness.  You have a loss of balance or coordination.  You have a sudden, severe headache with no known cause.  You have new chest pain or an irregular heartbeat. Any of these symptoms may represent a serious problem that is an emergency. Do not wait to see if the symptoms will go away. Get medical help at once. Call your local emergency services  (911 in [place] Do not drive yourself to the hospital. Document Released: 09/10/2004 Document Revised: 05/24/2013 Document Reviewed: 02/03/2013 [employer] Patient Information 2014 [employer].  

## 2014-04-09 ENCOUNTER — Encounter: Payer: Self-pay | Admitting: Cardiovascular Disease

## 2014-04-27 ENCOUNTER — Encounter: Payer: Self-pay | Admitting: Cardiovascular Disease

## 2014-04-27 ENCOUNTER — Ambulatory Visit (INDEPENDENT_AMBULATORY_CARE_PROVIDER_SITE_OTHER): Payer: Medicare Other | Admitting: Cardiovascular Disease

## 2014-04-27 VITALS — BP 122/62 | HR 50 | Ht 63.0 in | Wt 132.8 lb

## 2014-04-27 DIAGNOSIS — I209 Angina pectoris, unspecified: Secondary | ICD-10-CM

## 2014-04-27 DIAGNOSIS — I10 Essential (primary) hypertension: Secondary | ICD-10-CM

## 2014-04-27 DIAGNOSIS — I25119 Atherosclerotic heart disease of native coronary artery with unspecified angina pectoris: Secondary | ICD-10-CM

## 2014-04-27 DIAGNOSIS — I2510 Atherosclerotic heart disease of native coronary artery without angina pectoris: Secondary | ICD-10-CM

## 2014-04-27 MED ORDER — ASPIRIN 81 MG PO TBEC: 81.0000 mg | DELAYED_RELEASE_TABLET | Freq: Every evening | ORAL | Status: DC

## 2014-04-27 NOTE — Patient Instructions (Signed)
Your physician has recommended you make the following change in your medication: 1. Decrease Aspirin to 81 MG 1 tablet daily  Your physician wants you to follow-up in: 1 Year with Dr Theodoro Parma will receive a reminder letter in the mail two months in advance. If you don't receive a letter, please call our office to schedule the follow-up appointment.

## 2014-04-27 NOTE — Progress Notes (Signed)
HPI:  73 year old woman presenting for followup of coronary artery disease. She has undergone PCI, most recently in 2009. Her last nuclear scan was in 2012 and this demonstrated normal myocardial perfusion. Notably she does have an abnormal exercise EKG which has been documented several times in the past. She's also followed for hypertension and hyperlipidemia.  The patient was hospitalized several months ago with abdominal discomfort and elevated lactate. Fortunately, her illness was self-limited and she responded well to conservative therapy. She's had no further problems. She denies chest pain, shortness of breath, edema, orthopnea, or PND. She's had no recent palpitations. She reports no bleeding problems on long-term dual antiplatelet therapy. Recent lab work demonstrated normal electrolytes, a total cholesterol of 131, HDL 30, LDL 75, and triglycerides 409.   Outpatient Encounter Prescriptions as of 04/27/2014  Medication Sig  . amLODipine (NORVASC) 10 MG tablet Take 10 mg by mouth daily.  . Ascorbic Acid (VITAMIN C) 1000 MG tablet Take 1,000 mg by mouth daily.    Marland Kitchen aspirin (ASPIR-81) 81 MG EC tablet Take 162 mg by mouth every evening.   . calcium carbonate (CALCIUM 600) 600 MG TABS Take 600 mg by mouth 2 (two) times daily with a meal.  . Cholecalciferol (VITAMIN D) 2000 UNITS CAPS Take 2,000 Units by mouth daily.  . clopidogrel (PLAVIX) 75 MG tablet Take 75 mg by mouth daily.    Marland Kitchen levothyroxine (SYNTHROID, LEVOTHROID) 50 MCG tablet Take 50 mcg by mouth daily.  . metoprolol (LOPRESSOR) 50 MG tablet Take 50 mg by mouth 2 (two) times daily.    . Multiple Vitamins-Minerals (MULTIVITAL) tablet Take 1 tablet by mouth daily.    . Omega-3 Fatty Acids (FISH OIL) 1000 MG CAPS Take 1,000 mg by mouth daily.   . rosuvastatin (CRESTOR) 20 MG tablet Take 20 mg by mouth daily.  Marland Kitchen telmisartan-hydrochlorothiazide (MICARDIS HCT) 80-25 MG per tablet Take 1 tablet by mouth daily.  . [DISCONTINUED] Biotin  5000 MCG CAPS Take by mouth. daily  . [DISCONTINUED] rosuvastatin (CRESTOR) 10 MG tablet Take 10 mg by mouth daily.    Allergies  Allergen Reactions  . Penicillins   . Sulfonamide Derivatives     Past Medical History  Diagnosis Date  . CAD (coronary artery disease)     s/p PCI- LAD and RCA with drug-eluting stents, PTCA in 2009 to treat in stent restenosis in RCA  . Essential hypertension   . HLD (hyperlipidemia)   . Asymptomatic carotid artery stenosis     ROS: Negative except as per HPI  BP 122/62  Pulse 50  Ht  (1.6 m)  Wt 132 lb 12.8 oz (60.238 kg)  BMI 23.53 kg/m2  PHYSICAL EXAM: Pt is alert and oriented, NAD HEENT: normal Neck: JVP - normal, carotids 2+= with bilateral bruits Lungs: CTA bilaterally CV: RRR without murmur or gallop Abd: soft, NT, Positive BS, no hepatomegaly Ext: no C/C/E, distal pulses intact and equal Skin: warm/dry no rash  EKG:  Sinus bradycardia with first degree AV block, LVH with early repolarization pattern.  ASSESSMENT AND PLAN: 1. Coronary artery disease, native vessel. She remains stable without symptoms of angina. I reviewed her previous PCI for seizures which were performed in 2006. She underwent stenting of the LAD and right coronary artery. There was a chronically occluded obtuse marginal, and this was unable to be opened. She was treated with first generation drug-eluting stents in both vessels. As she is tolerating dual antiplatelet therapy without bleeding problems, I would favor  continuing her on treatment at this time. I recommended that she reduce her aspirin dose to 81 mg daily.  2. Carotid stenosis. Most recent carotid duplex studies were reviewed. She is followed by vascular surgery. She has no stroke or TIA symptoms.  3. Hypertension. Blood pressure appears well-controlled on a combination of amlodipine, metoprolol, telmisartan, and hydrochlorothiazide.  4. Hyperlipidemia. The patient is treated with rosuvastatin 20 mg  daily with lipids at goal, followed by Dr. Ricki Miller.  Tonny Bollman 04/27/2014 9:36 AM

## 2014-04-30 ENCOUNTER — Encounter: Payer: Self-pay | Admitting: Cardiovascular Disease

## 2014-05-29 ENCOUNTER — Encounter: Payer: Self-pay | Admitting: Cardiovascular Disease

## 2014-06-04 ENCOUNTER — Other Ambulatory Visit: Payer: Self-pay

## 2014-06-04 DIAGNOSIS — Z1231 Encounter for screening mammogram for malignant neoplasm of breast: Secondary | ICD-10-CM

## 2014-07-09 ENCOUNTER — Ambulatory Visit
Admission: RE | Admit: 2014-07-09 | Discharge: 2014-07-09 | Disposition: A | Discharge status: Home / Self Care | Payer: Medicare Other | Source: Ambulatory Visit

## 2014-07-09 DIAGNOSIS — Z1231 Encounter for screening mammogram for malignant neoplasm of breast: Secondary | ICD-10-CM

## 2014-08-27 DIAGNOSIS — R7309 Other abnormal glucose: Secondary | ICD-10-CM | POA: Diagnosis not present

## 2014-08-27 DIAGNOSIS — I2510 Atherosclerotic heart disease of native coronary artery without angina pectoris: Secondary | ICD-10-CM | POA: Diagnosis not present

## 2014-09-06 DIAGNOSIS — I2510 Atherosclerotic heart disease of native coronary artery without angina pectoris: Secondary | ICD-10-CM | POA: Diagnosis not present

## 2014-09-06 DIAGNOSIS — R7309 Other abnormal glucose: Secondary | ICD-10-CM | POA: Diagnosis not present

## 2014-09-06 DIAGNOSIS — Z23 Encounter for immunization: Secondary | ICD-10-CM | POA: Diagnosis not present

## 2014-09-06 DIAGNOSIS — I10 Essential (primary) hypertension: Secondary | ICD-10-CM | POA: Diagnosis not present

## 2014-09-06 DIAGNOSIS — E780 Pure hypercholesterolemia: Secondary | ICD-10-CM | POA: Diagnosis not present

## 2014-12-31 DIAGNOSIS — I10 Essential (primary) hypertension: Secondary | ICD-10-CM | POA: Diagnosis not present

## 2014-12-31 DIAGNOSIS — E559 Vitamin D deficiency, unspecified: Secondary | ICD-10-CM | POA: Diagnosis not present

## 2014-12-31 DIAGNOSIS — R7309 Other abnormal glucose: Secondary | ICD-10-CM | POA: Diagnosis not present

## 2014-12-31 DIAGNOSIS — I2510 Atherosclerotic heart disease of native coronary artery without angina pectoris: Secondary | ICD-10-CM | POA: Diagnosis not present

## 2015-01-07 DIAGNOSIS — I2510 Atherosclerotic heart disease of native coronary artery without angina pectoris: Secondary | ICD-10-CM | POA: Diagnosis not present

## 2015-01-07 DIAGNOSIS — R7309 Other abnormal glucose: Secondary | ICD-10-CM | POA: Diagnosis not present

## 2015-01-07 DIAGNOSIS — I10 Essential (primary) hypertension: Secondary | ICD-10-CM | POA: Diagnosis not present

## 2015-01-07 DIAGNOSIS — E039 Hypothyroidism, unspecified: Secondary | ICD-10-CM | POA: Diagnosis not present

## 2015-01-08 ENCOUNTER — Encounter: Payer: Self-pay | Admitting: Cardiovascular Disease

## 2015-02-05 ENCOUNTER — Ambulatory Visit: Payer: Medicare Other | Admitting: Family

## 2015-02-05 ENCOUNTER — Other Ambulatory Visit (HOSPITAL_COMMUNITY): Payer: Medicare Other

## 2015-02-13 ENCOUNTER — Other Ambulatory Visit (HOSPITAL_COMMUNITY): Payer: Medicare Other

## 2015-02-13 ENCOUNTER — Ambulatory Visit: Payer: Medicare Other | Admitting: Family

## 2015-02-25 ENCOUNTER — Encounter: Payer: Self-pay | Admitting: Family

## 2015-02-27 ENCOUNTER — Other Ambulatory Visit: Payer: Self-pay | Admitting: Family

## 2015-02-27 ENCOUNTER — Ambulatory Visit (HOSPITAL_COMMUNITY)
Admission: RE | Admit: 2015-02-27 | Discharge: 2015-02-27 | Disposition: A | Discharge status: Home / Self Care | Payer: Medicare Other | Source: Ambulatory Visit | Attending: Family | Admitting: Family

## 2015-02-27 ENCOUNTER — Encounter: Payer: Self-pay | Admitting: Family

## 2015-02-27 ENCOUNTER — Ambulatory Visit (INDEPENDENT_AMBULATORY_CARE_PROVIDER_SITE_OTHER): Payer: Medicare Other | Admitting: Family

## 2015-02-27 VITALS — BP 127/59 | HR 50 | Ht 63.0 in | Wt 135.0 lb

## 2015-02-27 DIAGNOSIS — I6523 Occlusion and stenosis of bilateral carotid arteries: Secondary | ICD-10-CM

## 2015-02-27 NOTE — Addendum Note (Signed)
Addended by: Adria DillELDRIDGE-LEWIS, Vasilis Luhman L on: 02/27/2015 05:22 PM   Modules accepted: Orders

## 2015-02-27 NOTE — Patient Instructions (Signed)
Stroke Prevention Some medical conditions and behaviors are associated with an increased chance of having a stroke. You may prevent a stroke by making healthy choices and managing medical conditions. HOW CAN I REDUCE MY RISK OF HAVING A STROKE?   Stay physically active. Get at least 30 minutes of activity on most or all days.  Do not smoke. It may also be helpful to avoid exposure to secondhand smoke.  Limit alcohol use. Moderate alcohol use is considered to be:  No more than 2 drinks per day for men.  No more than 1 drink per day for nonpregnant women.  Eat healthy foods. This involves:  Eating 5 or more servings of fruits and vegetables a day.  Making dietary changes that address high blood pressure (hypertension), high cholesterol, diabetes, or obesity.  Manage your cholesterol levels.  Making food choices that are high in fiber and low in saturated fat, trans fat, and cholesterol may control cholesterol levels.  Take any prescribed medicines to control cholesterol as directed by your health care provider.  Manage your diabetes.  Controlling your carbohydrate and sugar intake is recommended to manage diabetes.  Take any prescribed medicines to control diabetes as directed by your health care provider.  Control your hypertension.  Making food choices that are low in salt (sodium), saturated fat, trans fat, and cholesterol is recommended to manage hypertension.  Take any prescribed medicines to control hypertension as directed by your health care provider.  Maintain a healthy weight.  Reducing calorie intake and making food choices that are low in sodium, saturated fat, trans fat, and cholesterol are recommended to manage weight.  Stop drug abuse.  Avoid taking birth control pills.  Talk to your health care provider about the risks of taking birth control pills if you are over 35 years old, smoke, get migraines, or have ever had a blood clot.  Get evaluated for sleep  disorders (sleep apnea).  Talk to your health care provider about getting a sleep evaluation if you snore a lot or have excessive sleepiness.  Take medicines only as directed by your health care provider.  For some people, aspirin or blood thinners (anticoagulants) are helpful in reducing the risk of forming abnormal blood clots that can lead to stroke. If you have the irregular heart rhythm of atrial fibrillation, you should be on a blood thinner unless there is a good reason you cannot take them.  Understand all your medicine instructions.  Make sure that other conditions (such as anemia or atherosclerosis) are addressed. SEEK IMMEDIATE MEDICAL CARE IF:   You have sudden weakness or numbness of the face, arm, or leg, especially on one side of the body.  Your face or eyelid droops to one side.  You have sudden confusion.  You have trouble speaking (aphasia) or understanding.  You have sudden trouble seeing in one or both eyes.  You have sudden trouble walking.  You have dizziness.  You have a loss of balance or coordination.  You have a sudden, severe headache with no known cause.  You have new chest pain or an irregular heartbeat. Any of these symptoms may represent a serious problem that is an emergency. Do not wait to see if the symptoms will go away. Get medical help at once. Call your local emergency services (911 in [place] Do not drive yourself to the hospital. Document Released: 09/10/2004 Document Revised: 12/18/2013 Document Reviewed: 02/03/2013 [employer] Patient Information 2015 [employer]. This information is not intended to replace advice given   to you by your health care provider. Make sure you discuss any questions you have with your health care provider.  

## 2015-02-27 NOTE — Progress Notes (Signed)
Established Carotid Patient   History of Present Illness  Jennifer Russo is a 74 y.o. female patient of Dr. Arbie Cookey who has known carotid stenosis. She returns today for follow up.  Patient has not had previous carotid artery intervention.  Patient has no history of TIA or stroke symptoms; specifically she denies a history of amaurosis fugax or monocular blindness, unilateral facial drooping, hemiparesis,  or receptive or expressive aphasia.   She has CAD with 2 stents, denies history of MI. She denies claudication symptoms in her legs with walking, denies non healing wounds.  Pt denies New Medical or Surgical History.  Pt Diabetic: No Pt smoker: non-smoker She exercises 4 days/week, does not use ETOH.  Pt meds include: Statin : Yes ASA: Yes Other anticoagulants/antiplatelets: Plavix  Past Medical History  Diagnosis Date  . CAD (coronary artery disease)     s/p PCI- LAD and RCA with drug-eluting stents, PTCA in 2009 to treat in stent restenosis in RCA  . Essential hypertension   . HLD (hyperlipidemia)   . Asymptomatic carotid artery stenosis     Social History History  Substance Use Topics  . Smoking status: Never Smoker   . Smokeless tobacco: Never Used  . Alcohol Use: No    Family History Family History  Problem Relation Age of Onset  . Hypertension Mother   . Diabetes Father     Surgical History Past Surgical History  Procedure Laterality Date  . Coronary angioplasty  03-30-05 / 04-14-05  . Femoral artery repair Right 07-03-08    Groin area  . Coronary stent placement      Allergies  Allergen Reactions  . Penicillins   . Sulfonamide Derivatives     Current Outpatient Prescriptions  Medication Sig Dispense Refill  . amLODipine (NORVASC) 10 MG tablet Take 10 mg by mouth daily.    . Ascorbic Acid (VITAMIN C) 1000 MG tablet Take 1,000 mg by mouth daily.      Marland Kitchen aspirin (ASPIR-81) 81 MG EC tablet Take 1 tablet (81 mg total) by mouth every evening.    .  calcium carbonate (CALCIUM 600) 600 MG TABS Take 600 mg by mouth 2 (two) times daily with a meal.    . Cholecalciferol (VITAMIN D) 2000 UNITS CAPS Take 2,000 Units by mouth daily.    . clopidogrel (PLAVIX) 75 MG tablet Take 75 mg by mouth daily.      Marland Kitchen levothyroxine (SYNTHROID, LEVOTHROID) 50 MCG tablet Take 50 mcg by mouth daily.    . metoprolol (LOPRESSOR) 50 MG tablet Take 50 mg by mouth 2 (two) times daily.      . Multiple Vitamins-Minerals (MULTIVITAL) tablet Take 1 tablet by mouth daily.      . Omega-3 Fatty Acids (FISH OIL) 1000 MG CAPS Take 1,000 mg by mouth daily.     . rosuvastatin (CRESTOR) 20 MG tablet Take 20 mg by mouth daily.    Marland Kitchen telmisartan-hydrochlorothiazide (MICARDIS HCT) 80-25 MG per tablet Take 1 tablet by mouth daily.     No current facility-administered medications for this visit.    Review of Systems : See HPI for pertinent positives and negatives.  Physical Examination  Filed Vitals:   02/27/15 1150 02/27/15 1152  BP: 127/56 127/59  Pulse: 49 50  Height:  (1.6 m)   Weight: 135 lb (61.236 kg)   SpO2: 95%    Body mass index is 23.92 kg/(m^2).  General: WDWN female in NAD GAIT: normal Eyes: PERRLA Pulmonary: Non-labored, CTAB, Negative Rales,  Negative rhonchi, & Negative wheezing.  Cardiac: regular Rhythm, + murmur.  VASCULAR EXAM Carotid Bruits Left Right   Transmitted cardiac murmur Negative   Aorta is not palpable. Radial pulses are 2+ palpable and equal.      LE Pulses LEFT RIGHT   POPLITEAL not palpable  not palpable   POSTERIOR TIBIAL  palpable   palpable    DORSALIS PEDIS  ANTERIOR TIBIAL palpable  palpable     Gastrointestinal: soft, nontender, BS WNL, no r/g,no palpable masses.  Musculoskeletal: Negative muscle atrophy/wasting. M/S 5/5 throughout,  Extremities without ischemic changes.  Neurologic: A&O X 3; Appropriate Affect, Speech is normal CN 2-12 intact, Pain and light touch intact in extremities, Motor exam as listed above.          Non-Invasive Vascular Imaging CAROTID DUPLEX 02/27/2015   CEREBROVASCULAR DUPLEX EVALUATION    INDICATION: Carotid disease    PREVIOUS INTERVENTION(S):     DUPLEX EXAM:     RIGHT  LEFT  Peak Systolic Velocities (cm/s) End Diastolic Velocities (cm/s) Plaque LOCATION Peak Systolic Velocities (cm/s) End Diastolic Velocities (cm/s) Plaque  78 15  CCA PROXIMAL 93 12   82 13  CCA MID 83 15   72/146 15/25 HT CCA DISTAL 97 17 HT  161 13  ECA 194 0 HT  135 23 HT ICA PROXIMAL 123 12 HT  127 24  ICA MID 103 19   87 22  ICA DISTAL 82 20     Not Calculated ICA / CCA Ratio (PSV)   Antegrade Vertebral Flow Antegrade  148 Brachial Systolic Pressure (mmHg) 152  Multiphasic (subclavian artery) Brachial Artery Waveforms Multiphasic (subclavian artery)    Plaque Morphology:  HM = Homogeneous, HT = Heterogeneous, CP = Calcific Plaque, SP = Smooth Plaque, IP = Irregular Plaque     ADDITIONAL FINDINGS: . No significant stenosis of the right external or left common carotid arteries. . Greater than 50% left external carotid artery stenosis noted.    IMPRESSION: Doppler velocities suggest 1-39% stenoses of the bilateral proximal internal carotid arteries and a greater than 50% stenosis of the right common carotid artery near the bifurcation.    Compared to the previous exam:  No significant change noted when compared to the previous exam on 01/30/14.        Assessment: Jennifer Lewandowskyetrina M Russo is a 74 y.o. female who has no history of stroke or TIA.  Today's carotid Duplex suggests 1-39% stenoses of the bilateral proximal internal carotid arteries and a greater than 50% stenosis of the right common carotid artery near the bifurcation. No significant change noted when compared to the previous exam on 01/30/14.     Plan: Follow-up in 1 year with Carotid Duplex.   I discussed in depth with the patient the nature of atherosclerosis, and emphasized the importance of maximal medical management including strict control of blood pressure, blood glucose, and lipid levels, obtaining regular exercise, and continued cessation of smoking.  The patient is aware that without maximal medical management the underlying atherosclerotic disease process will progress, limiting the benefit of any interventions. The patient was given information about stroke prevention and what symptoms should prompt the patient to seek immediate medical care. Thank you for allowing us to participate in this patient's care.  Charisse MarchSuzanne Azavion Bouillon, RN, MSN, FNP-C [facility] Office: [phone]  Clinic Physician: Edilia BoDickson  02/27/2015 12:02 PM

## 2015-03-06 ENCOUNTER — Ambulatory Visit (INDEPENDENT_AMBULATORY_CARE_PROVIDER_SITE_OTHER): Payer: Medicare Other | Admitting: Cardiovascular Disease

## 2015-03-06 ENCOUNTER — Encounter: Payer: Self-pay | Admitting: Cardiovascular Disease

## 2015-03-06 VITALS — BP 122/52 | HR 53 | Ht 63.0 in | Wt 137.0 lb

## 2015-03-06 DIAGNOSIS — I2510 Atherosclerotic heart disease of native coronary artery without angina pectoris: Secondary | ICD-10-CM | POA: Diagnosis not present

## 2015-03-06 DIAGNOSIS — I10 Essential (primary) hypertension: Secondary | ICD-10-CM | POA: Diagnosis not present

## 2015-03-06 NOTE — Progress Notes (Signed)
Cardiology Office Note Date:  03/06/2015   ID:  Ellyn, Rubiano 08/16/1941, MRN [id number]  PCP:  Londell Moh, MD  Cardiologist:  Tonny Bollman, MD    Chief Complaint  Patient presents with  . Coronary Artery Disease   History of Present Illness: Jennifer Russo is a 74 y.o. female who presents for follow-up evaluation. She has CAD and has undergone PCI, most recently in 2009. Her last nuclear scan was in 2012 and this demonstrated normal myocardial perfusion. Notably she does have an abnormal exercise EKG which has been documented several times in the past. She's also followed for hypertension and hyperlipidemia.  The patient is doing well. She denies chest pain, shortness of breath, or leg swelling. No palpitations, lightheadedness, or syncope. She occasionally wakes up with a cramp in her left lower leg, but denies any claudication symptoms.   Past Medical History  Diagnosis Date  . CAD (coronary artery disease)     s/p PCI- LAD and RCA with drug-eluting stents, PTCA in 2009 to treat in stent restenosis in RCA  . Essential hypertension   . HLD (hyperlipidemia)   . Asymptomatic carotid artery stenosis    Past Surgical History  Procedure Laterality Date  . Coronary angioplasty  03-30-05 / 04-14-05  . Femoral artery repair Right 07-03-08    Groin area  . Coronary stent placement     Current Outpatient Prescriptions  Medication Sig Dispense Refill  . amLODipine (NORVASC) 10 MG tablet Take 10 mg by mouth daily.    . Ascorbic Acid (VITAMIN C) 1000 MG tablet Take 1,000 mg by mouth daily.      Marland Kitchen aspirin (ASPIR-81) 81 MG EC tablet Take 1 tablet (81 mg total) by mouth every evening.    . calcium carbonate (CALCIUM 600) 600 MG TABS Take 600 mg by mouth 2 (two) times daily with a meal.    . Cholecalciferol (VITAMIN D) 2000 UNITS CAPS Take 2,000 Units by mouth daily.    . clopidogrel (PLAVIX) 75 MG tablet Take 75 mg by mouth daily.      Marland Kitchen levothyroxine (SYNTHROID,  LEVOTHROID) 50 MCG tablet Take 50 mcg by mouth daily.    . metoprolol (LOPRESSOR) 50 MG tablet Take 50 mg by mouth 2 (two) times daily.      . Multiple Vitamins-Minerals (MULTIVITAL) tablet Take 1 tablet by mouth daily.      . Omega-3 Fatty Acids (FISH OIL) 1000 MG CAPS Take 1,000 mg by mouth daily.     . rosuvastatin (CRESTOR) 20 MG tablet Take 20 mg by mouth daily.    Marland Kitchen telmisartan-hydrochlorothiazide (MICARDIS HCT) 80-25 MG per tablet Take 1 tablet by mouth daily.     No current facility-administered medications for this visit.   Allergies:   Penicillins and Sulfonamide derivatives   Social History:  The patient  reports that she has never smoked. She has never used smokeless tobacco. She reports that she does not drink alcohol or use illicit drugs.   Family History:  The patient's family history includes Diabetes in her father; Hypertension in her mother.    ROS:  Please see the history of present illness.  Otherwise, review of systems is positive for left leg cramps.  All other systems are reviewed and negative.    PHYSICAL EXAM: VS:  BP 122/52 mmHg  Pulse 53  Ht  (1.6 m)  Wt 137 lb (62.143 kg)  BMI 24.27 kg/m2 , BMI Body mass index is 24.27 kg/(m^2). GEN: Well  nourished, well developed, in no acute distress HEENT: normal Neck: no JVD, no masses. Bialteral carotid bruits Cardiac: bradycardic and regular with a grade 2/6 systolic ejection murmur at the LSB             Respiratory:  clear to auscultation bilaterally, normal work of breathing GI: soft, nontender, nondistended, + BS MS: no deformity or atrophy Ext: no pretibial edema, pedal pulses 2+= bilaterally Skin: warm and dry, no rash Neuro:  Strength and sensation are intact Psych: euthymic mood, full affect  EKG:  EKG is ordered today. The ekg ordered today shows Sinus br  Recent Labs: No results found for requested labs within last 365 days.   Lipid Panel  No results found for: CHOL, TRIG, HDL, CHOLHDL,  VLDL, LDLCALC, LDLDIRECT    Wt Readings from Last 3 Encounters:  03/06/15 137 lb (62.143 kg)  02/27/15 135 lb (61.236 kg)  04/27/14 132 lb 12.8 oz (60.238 kg)    ASSESSMENT AND PLAN: 1.  CAD, native vessel: no symptoms of angina. Doing well on long-term DAPT without bleeding problems. Continue current Rx.   2. Carotid stenosis: followed by vascular surgery, asymptomatic. Recent office note and carotid duplex reviewed.  3. HTN: continues to have excellent BP control on current regimen which was reviewed today.  4. Hyperlipidemia: lipids have been at goal. Treated by PCP - switching to Dr Renne CriglerPharr as Dr Ricki MillerPang has relocated.   Most recent labs from May 2016 reviewed - complete lab panel performed by Dr Ricki MillerPang.  Current medicines are reviewed with the patient today.  The patient does not have concerns regarding medicines.  Labs/ tests ordered today include:   Orders Placed This Encounter  Procedures  . EKG 12-Lead   Disposition:   FU one year  Signed, Tonny Bollmanooper, Lauran Romanski, MD  03/06/2015 5:32 PM    [facility] [address], [place], [place]  [address] Phone: [phone]; Fax: [phone]

## 2015-03-06 NOTE — Patient Instructions (Signed)

## 2015-06-03 ENCOUNTER — Other Ambulatory Visit: Payer: Self-pay

## 2015-06-03 DIAGNOSIS — Z1231 Encounter for screening mammogram for malignant neoplasm of breast: Secondary | ICD-10-CM

## 2015-07-15 ENCOUNTER — Ambulatory Visit: Payer: Medicare Other

## 2015-07-15 ENCOUNTER — Ambulatory Visit
Admission: RE | Admit: 2015-07-15 | Discharge: 2015-07-15 | Disposition: A | Discharge status: Home / Self Care | Payer: Medicare Other | Source: Ambulatory Visit

## 2015-07-15 DIAGNOSIS — Z1231 Encounter for screening mammogram for malignant neoplasm of breast: Secondary | ICD-10-CM

## 2015-12-19 DIAGNOSIS — E559 Vitamin D deficiency, unspecified: Secondary | ICD-10-CM | POA: Diagnosis not present

## 2015-12-19 DIAGNOSIS — Z0000 Encounter for general adult medical examination without abnormal findings: Secondary | ICD-10-CM | POA: Diagnosis not present

## 2015-12-19 DIAGNOSIS — I10 Essential (primary) hypertension: Secondary | ICD-10-CM | POA: Diagnosis not present

## 2015-12-19 DIAGNOSIS — M8580 Other specified disorders of bone density and structure, unspecified site: Secondary | ICD-10-CM | POA: Diagnosis not present

## 2015-12-19 DIAGNOSIS — E7800 Pure hypercholesterolemia, unspecified: Secondary | ICD-10-CM | POA: Diagnosis not present

## 2015-12-19 DIAGNOSIS — E039 Hypothyroidism, unspecified: Secondary | ICD-10-CM | POA: Diagnosis not present

## 2015-12-27 DIAGNOSIS — Z0000 Encounter for general adult medical examination without abnormal findings: Secondary | ICD-10-CM | POA: Diagnosis not present

## 2016-02-27 ENCOUNTER — Encounter: Payer: Self-pay | Admitting: Family

## 2016-03-03 ENCOUNTER — Ambulatory Visit (HOSPITAL_COMMUNITY)
Admission: RE | Admit: 2016-03-03 | Discharge: 2016-03-03 | Disposition: A | Discharge status: Home / Self Care | Payer: Medicare Other | Source: Ambulatory Visit | Attending: Family | Admitting: Family

## 2016-03-03 ENCOUNTER — Encounter: Payer: Self-pay | Admitting: Family

## 2016-03-03 ENCOUNTER — Ambulatory Visit (INDEPENDENT_AMBULATORY_CARE_PROVIDER_SITE_OTHER): Payer: Medicare Other | Admitting: Family

## 2016-03-03 VITALS — BP 130/58 | HR 51 | Temp 97.8°F | Resp 18 | Ht 63.0 in | Wt 135.6 lb

## 2016-03-03 DIAGNOSIS — I10 Essential (primary) hypertension: Secondary | ICD-10-CM | POA: Diagnosis not present

## 2016-03-03 DIAGNOSIS — E785 Hyperlipidemia, unspecified: Secondary | ICD-10-CM | POA: Diagnosis not present

## 2016-03-03 DIAGNOSIS — I6523 Occlusion and stenosis of bilateral carotid arteries: Secondary | ICD-10-CM

## 2016-03-03 DIAGNOSIS — I2510 Atherosclerotic heart disease of native coronary artery without angina pectoris: Secondary | ICD-10-CM | POA: Insufficient documentation

## 2016-03-03 NOTE — Progress Notes (Signed)
Chief Complaint: Follow up Extracranial Carotid Artery Stenosis   History of Present Illness  Jennifer Russo is a 75 y.o. female  patient of Dr. Arbie CookeyEarly who has known extracranial carotid artery stenosis. She returns today for follow up.  Patient has not had previous carotid artery intervention.  Patient has no history of TIA or stroke symptoms; specifically she denies a history of amaurosis fugax or monocular blindness, unilateral facial drooping, hemiparesis, or receptive or expressive aphasia.   She has CAD with 2 stents, denies history of MI. She denies claudication symptoms in her legs with walking, denies non healing wounds.  Pt denies New Medical or Surgical History. She denies chest pain, denies dyspnea, denies a cough.  Pt Diabetic: No Pt smoker: non-smoker She exercises 4 days/week, does not use ETOH.  Pt meds include: Statin : Yes ASA: Yes Other anticoagulants/antiplatelets: Plavix    Past Medical History  Diagnosis Date  . CAD (coronary artery disease)     s/p PCI- LAD and RCA with drug-eluting stents, PTCA in 2009 to treat in stent restenosis in RCA  . Essential hypertension   . HLD (hyperlipidemia)   . Asymptomatic carotid artery stenosis     Social History Social History  Substance Use Topics  . Smoking status: Never Smoker   . Smokeless tobacco: Never Used  . Alcohol Use: No    Family History Family History  Problem Relation Age of Onset  . Hypertension Mother   . Diabetes Father     Surgical History Past Surgical History  Procedure Laterality Date  . Coronary angioplasty  03-30-05 / 04-14-05  . Femoral artery repair Right 07-03-08    Groin area  . Coronary stent placement      Allergies  Allergen Reactions  . Penicillins   . Sulfonamide Derivatives     Current Outpatient Prescriptions  Medication Sig Dispense Refill  . amLODipine (NORVASC) 10 MG tablet Take 10 mg by mouth daily.    . Ascorbic Acid (VITAMIN C) 1000 MG tablet Take  1,000 mg by mouth daily.      Marland Kitchen. aspirin (ASPIR-81) 81 MG EC tablet Take 1 tablet (81 mg total) by mouth every evening.    . calcium carbonate (CALCIUM 600) 600 MG TABS Take 600 mg by mouth 2 (two) times daily with a meal.    . Cholecalciferol (VITAMIN D) 2000 UNITS CAPS Take 2,000 Units by mouth daily.    . clopidogrel (PLAVIX) 75 MG tablet Take 75 mg by mouth daily.      Marland Kitchen. levothyroxine (SYNTHROID, LEVOTHROID) 50 MCG tablet Take 50 mcg by mouth daily.    . metoprolol (LOPRESSOR) 50 MG tablet Take 50 mg by mouth 2 (two) times daily.      . Multiple Vitamins-Minerals (MULTIVITAL) tablet Take 1 tablet by mouth daily.      . Omega-3 Fatty Acids (FISH OIL) 1000 MG CAPS Take 1,000 mg by mouth daily.     . rosuvastatin (CRESTOR) 20 MG tablet Take 20 mg by mouth daily.    Marland Kitchen. telmisartan-hydrochlorothiazide (MICARDIS HCT) 80-25 MG per tablet Take 1 tablet by mouth daily.     No current facility-administered medications for this visit.    Review of Systems : See HPI for pertinent positives and negatives.  Physical Examination  Filed Vitals:   03/03/16 1009  BP: 130/58  Pulse: 51  Temp: 97.8 F (36.6 C)  TempSrc: Oral  Resp: 18  Height: 5\' 3"  (1.6 m)  Weight: 135 lb 9.6 oz (61.508  kg)  SpO2: 98%   Body mass index is 24.03 kg/(m^2).  General: WDWN female in NAD GAIT: normal Eyes: PERRLA Pulmonary: Respirations are non-labored, rales in both bases, no rhonchi or wheezes  Cardiac: regular rhythm, + murmur.  VASCULAR EXAM Carotid Bruits Left Right   Transmitted cardiac murmur Transmitted cardiac murmur   Aorta is not palpable. Radial pulses are 2+ palpable and equal.      LE Pulses LEFT RIGHT   POPLITEAL not palpable  not palpable   POSTERIOR TIBIAL  palpable   palpable    DORSALIS PEDIS  ANTERIOR  TIBIAL palpable  palpable     Gastrointestinal: soft, nontender, BS WNL, no r/g,no palpable masses.  Musculoskeletal: No muscle atrophy/wasting. M/S 5/5 throughout, Extremities without ischemic changes.  Neurologic: A&O X 3; Appropriate Affect, Speech is normal CN 2-12 intact, Pain and light touch intact in extremities, Motor exam as listed above.               Non-Invasive Vascular Imaging CAROTID DUPLEX 03/03/2016   <40% bilateral ICA stenoses, greater than 50% stenosis of the right distal common carotid artery/bifurcation region. No significant change compared to the exam of 02/27/15.   Assessment: Jennifer Russo is a 75 y.o. female who has no history of stroke or TIA.  Today's carotid Duplex suggests <40% stenoses of the bilateral proximal internal carotid arteries and a greater than 50% stenosis of the right common carotid artery near the bifurcation. No significant change noted when compared to the previous exam on 01/30/14 and 02/27/15.  Her atherosclerotic risk factors include CAD, dyslipidemia, and controled hypertension. She takes a statin, ASA, and Plavix on a regular basis. Fortunately she does not have DM, has never used tobacco, and exercises most days of the week.   Plan: Follow-up in 1 year with Carotid Duplex scan.   I discussed in depth with the patient the nature of atherosclerosis, and emphasized the importance of maximal medical management including strict control of blood pressure, blood glucose, and lipid levels, obtaining regular exercise, and continued cessation of smoking.  The patient is aware that without maximal medical management the underlying atherosclerotic disease process will progress, limiting the benefit of any interventions. The patient was given information about stroke prevention and what symptoms should prompt the patient to seek immediate medical care. Thank you for allowing [place] to participate in this patient's care.  Charisse March, RN, MSN, FNP-C [facility] Office: [phone]  Clinic Physician: Early  03/03/2016 10:45 AM

## 2016-03-03 NOTE — Patient Instructions (Signed)
Stroke Prevention Some medical conditions and behaviors are associated with an increased chance of having a stroke. You may prevent a stroke by making healthy choices and managing medical conditions. HOW CAN I REDUCE MY RISK OF HAVING A STROKE?   Stay physically active. Get at least 30 minutes of activity on most or all days.  Do not smoke. It may also be helpful to avoid exposure to secondhand smoke.  Limit alcohol use. Moderate alcohol use is considered to be:  No more than 2 drinks per day for men.  No more than 1 drink per day for nonpregnant women.  Eat healthy foods. This involves:  Eating 5 or more servings of fruits and vegetables a day.  Making dietary changes that address high blood pressure (hypertension), high cholesterol, diabetes, or obesity.  Manage your cholesterol levels.  Making food choices that are high in fiber and low in saturated fat, trans fat, and cholesterol may control cholesterol levels.  Take any prescribed medicines to control cholesterol as directed by your health care provider.  Manage your diabetes.  Controlling your carbohydrate and sugar intake is recommended to manage diabetes.  Take any prescribed medicines to control diabetes as directed by your health care provider.  Control your hypertension.  Making food choices that are low in salt (sodium), saturated fat, trans fat, and cholesterol is recommended to manage hypertension.  Ask your health care provider if you need treatment to lower your blood pressure. Take any prescribed medicines to control hypertension as directed by your health care provider.  If you are 18-39 years of age, have your blood pressure checked every 3-5 years. If you are 40 years of age or older, have your blood pressure checked every year.  Maintain a healthy weight.  Reducing calorie intake and making food choices that are low in sodium, saturated fat, trans fat, and cholesterol are recommended to manage  weight.  Stop drug abuse.  Avoid taking birth control pills.  Talk to your health care provider about the risks of taking birth control pills if you are over 35 years old, smoke, get migraines, or have ever had a blood clot.  Get evaluated for sleep disorders (sleep apnea).  Talk to your health care provider about getting a sleep evaluation if you snore a lot or have excessive sleepiness.  Take medicines only as directed by your health care provider.  For some people, aspirin or blood thinners (anticoagulants) are helpful in reducing the risk of forming abnormal blood clots that can lead to stroke. If you have the irregular heart rhythm of atrial fibrillation, you should be on a blood thinner unless there is a good reason you cannot take them.  Understand all your medicine instructions.  Make sure that other conditions (such as anemia or atherosclerosis) are addressed. SEEK IMMEDIATE MEDICAL CARE IF:   You have sudden weakness or numbness of the face, arm, or leg, especially on one side of the body.  Your face or eyelid droops to one side.  You have sudden confusion.  You have trouble speaking (aphasia) or understanding.  You have sudden trouble seeing in one or both eyes.  You have sudden trouble walking.  You have dizziness.  You have a loss of balance or coordination.  You have a sudden, severe headache with no known cause.  You have new chest pain or an irregular heartbeat. Any of these symptoms may represent a serious problem that is an emergency. Do not wait to see if the symptoms will   go away. Get medical help at once. Call your local emergency services (911 in [place] Do not drive yourself to the hospital.   This information is not intended to replace advice given to you by your health care provider. Make sure you discuss any questions you have with your health care provider.   Document Released: 09/10/2004 Document Revised: 08/24/2014 Document Reviewed:  02/03/2013 Elsevier Interactive Patient Education 2016 [employer] Inc[employer]  

## 2016-03-06 DIAGNOSIS — H02413 Mechanical ptosis of bilateral eyelids: Secondary | ICD-10-CM | POA: Diagnosis not present

## 2016-03-06 DIAGNOSIS — H353130 Nonexudative age-related macular degeneration, bilateral, stage unspecified: Secondary | ICD-10-CM | POA: Diagnosis not present

## 2016-03-06 DIAGNOSIS — H0230 Blepharochalasis unspecified eye, unspecified eyelid: Secondary | ICD-10-CM | POA: Diagnosis not present

## 2016-03-06 DIAGNOSIS — I10 Essential (primary) hypertension: Secondary | ICD-10-CM | POA: Diagnosis not present

## 2016-04-09 DIAGNOSIS — M25561 Pain in right knee: Secondary | ICD-10-CM | POA: Diagnosis not present

## 2016-04-09 DIAGNOSIS — M179 Osteoarthritis of knee, unspecified: Secondary | ICD-10-CM | POA: Diagnosis not present

## 2016-04-13 DIAGNOSIS — M25569 Pain in unspecified knee: Secondary | ICD-10-CM | POA: Diagnosis not present

## 2016-04-13 DIAGNOSIS — M064 Inflammatory polyarthropathy: Secondary | ICD-10-CM | POA: Diagnosis not present

## 2016-04-13 DIAGNOSIS — M25461 Effusion, right knee: Secondary | ICD-10-CM | POA: Diagnosis not present

## 2016-04-13 DIAGNOSIS — M25561 Pain in right knee: Secondary | ICD-10-CM | POA: Diagnosis not present

## 2016-04-14 ENCOUNTER — Encounter: Payer: Self-pay | Admitting: Cardiovascular Disease

## 2016-04-30 ENCOUNTER — Ambulatory Visit (INDEPENDENT_AMBULATORY_CARE_PROVIDER_SITE_OTHER): Payer: Medicare Other | Admitting: Cardiovascular Disease

## 2016-04-30 ENCOUNTER — Encounter: Payer: Self-pay | Admitting: Cardiovascular Disease

## 2016-04-30 VITALS — BP 122/50 | HR 56 | Ht 63.0 in | Wt 136.2 lb

## 2016-04-30 DIAGNOSIS — R011 Cardiac murmur, unspecified: Secondary | ICD-10-CM | POA: Diagnosis not present

## 2016-04-30 DIAGNOSIS — I10 Essential (primary) hypertension: Secondary | ICD-10-CM

## 2016-04-30 DIAGNOSIS — I2510 Atherosclerotic heart disease of native coronary artery without angina pectoris: Secondary | ICD-10-CM

## 2016-04-30 NOTE — Progress Notes (Signed)
Cardiology Office Note Date:  04/30/2016   ID:  Jennifer, Russo 07/16/1941, MRN [id number]  PCP:  Londell Moh, MD  Cardiologist:  Tonny Bollman, MD    Chief Complaint  Patient presents with  . Atherosclerosis of Native Artery     History of Present Illness: Jennifer Russo is a 75 y.o. female who presents for follow-up evaluation. She has CAD and has undergone PCI, most recently in 2009. Her last nuclear scan was in 2012 and this demonstrated normal myocardial perfusion. Notably she does have an abnormal exercise EKG which has been documented several times in the past. She's also followed for hypertension, carotid artery disease, and hyperlipidemia.  The patient's only complaint is right knee pain and swelling. She has no cardiac symptoms. States that her medications have not changed. Today, she denies symptoms of palpitations, chest pain, shortness of breath, orthopnea, PND, dizziness, or syncope. She does complain of "puffiness" in her legs especially in the afternoons.   Past Medical History:  Diagnosis Date  . Asymptomatic carotid artery stenosis   . CAD (coronary artery disease)    s/p PCI- LAD and RCA with drug-eluting stents, PTCA in 2009 to treat in stent restenosis in RCA  . Essential hypertension   . HLD (hyperlipidemia)     Past Surgical History:  Procedure Laterality Date  . CORONARY ANGIOPLASTY  03-30-05 / 04-14-05  . CORONARY STENT PLACEMENT    . FEMORAL ARTERY REPAIR Right 07-03-08   Groin area    Current Outpatient Prescriptions  Medication Sig Dispense Refill  . amLODipine (NORVASC) 10 MG tablet Take 10 mg by mouth daily.    . Ascorbic Acid (VITAMIN C) 1000 MG tablet Take 1,000 mg by mouth daily.      Marland Kitchen aspirin (ASPIR-81) 81 MG EC tablet Take 1 tablet (81 mg total) by mouth every evening.    . calcium carbonate (CALCIUM 600) 600 MG TABS Take 600 mg by mouth 2 (two) times daily with a meal.    . Cholecalciferol (VITAMIN D) 2000 UNITS CAPS Take  2,000 Units by mouth daily.    . clopidogrel (PLAVIX) 75 MG tablet Take 75 mg by mouth daily.      Marland Kitchen levothyroxine (SYNTHROID, LEVOTHROID) 50 MCG tablet Take 50 mcg by mouth daily.    . metoprolol (LOPRESSOR) 50 MG tablet Take 50 mg by mouth 2 (two) times daily.      . Multiple Vitamins-Minerals (MULTIVITAL) tablet Take 1 tablet by mouth daily.      . Omega-3 Fatty Acids (FISH OIL) 1000 MG CAPS Take 1,000 mg by mouth daily.     . rosuvastatin (CRESTOR) 20 MG tablet Take 20 mg by mouth daily.    Marland Kitchen telmisartan-hydrochlorothiazide (MICARDIS HCT) 80-25 MG per tablet Take 1 tablet by mouth daily.     No current facility-administered medications for this visit.     Allergies:   Penicillins and Sulfonamide derivatives   Social History:  The patient  reports that she has never smoked. She has never used smokeless tobacco. She reports that she does not drink alcohol or use drugs.   Family History:  The patient's  family history includes Diabetes in her father; Hypertension in her mother.    ROS:  Please see the history of present illness.  Otherwise, review of systems is positive for Hearing loss, joint swelling, balance problems.  All other systems are reviewed and negative.    PHYSICAL EXAM: VS:  BP (!) 122/50   Pulse (!) 56  Ht 5\' 3"  (1.6 m)   Wt 61.8 kg (136 lb 3.2 oz)   BMI 24.13 kg/m  , BMI Body mass index is 24.13 kg/m. GEN: Well nourished, well developed, in no acute distress  HEENT: normal  Neck: no JVD, no masses. left carotid bruit Cardiac: RRR with 2/6 systolic murmur at the RUSB, A2 component intact, mid-peaking murmur Respiratory:  clear to auscultation bilaterally, normal work of breathing GI: soft, nontender, nondistended, + BS MS: no deformity or atrophy  Ext: no pretibial edema, pedal pulses 2+= bilaterally Skin: warm and dry, no rash Neuro:  Strength and sensation are intact Psych: euthymic mood, full affect  EKG:  EKG is ordered today. The ekg ordered today  shows marked sinus bradycardia 47 bpm, voltage criteria for LVH.  Recent Labs: No results found for requested labs within last 8760 hours.   Lipid Panel  No results found for: CHOL, TRIG, HDL, CHOLHDL, VLDL, LDLCALC, LDLDIRECT    Wt Readings from Last 3 Encounters:  04/30/16 61.8 kg (136 lb 3.2 oz)  03/03/16 61.5 kg (135 lb 9.6 oz)  03/06/15 62.1 kg (137 lb)     ASSESSMENT AND PLAN: 1.  CAD, native vessel: no angina. Medical therapy reviewed and will be continued. She has been maintained on long-term dual antiplatelet therapy without bleeding problems.  2. Carotid stenosis without history of stroke: Recent vascular surgery note reviewed as well as her most recent carotid duplex scan.  3. Essential hypertension: Blood pressure is well controlled today on a combination of amlodipine, metoprolol, and Micardis HCT.  4. Hyperlipidemia: Patient is treated with Crestor 20 mg daily. Labs followed by her PCP.  5. Sinus bradycardia: Asymptomatic. Patient is on a beta blocker but seems to be tolerating it well and will continue without change.  6. Heart murmur: No previous echo assessment. May be a benign flow murmur but also may have mild aortic stenosis. Will check an echo to better define this.  Current medicines are reviewed with the patient today.  The patient does not have concerns regarding medicines.  Labs/ tests ordered today include:   Orders Placed This Encounter  Procedures  . EKG 12-Lead  . ECHOCARDIOGRAM COMPLETE    Disposition:   FU one year  Signed, Tonny Bollmanooper, Tien Aispuro, MD  04/30/2016 1:04 PM    [facility] [address], [place], [place]  [address] Phone: [phone]; Fax: [phone]

## 2016-04-30 NOTE — Patient Instructions (Signed)

## 2016-05-01 DIAGNOSIS — M25569 Pain in unspecified knee: Secondary | ICD-10-CM | POA: Diagnosis not present

## 2016-05-01 DIAGNOSIS — M25461 Effusion, right knee: Secondary | ICD-10-CM | POA: Diagnosis not present

## 2016-05-01 DIAGNOSIS — M064 Inflammatory polyarthropathy: Secondary | ICD-10-CM | POA: Diagnosis not present

## 2016-05-05 ENCOUNTER — Other Ambulatory Visit: Payer: Self-pay | Admitting: Rheumatology

## 2016-05-05 DIAGNOSIS — M25561 Pain in right knee: Secondary | ICD-10-CM

## 2016-05-09 ENCOUNTER — Ambulatory Visit
Admission: RE | Admit: 2016-05-09 | Discharge: 2016-05-09 | Disposition: A | Discharge status: Home / Self Care | Payer: Medicare Other | Source: Ambulatory Visit | Attending: Rheumatology | Admitting: Rheumatology

## 2016-05-09 DIAGNOSIS — S83241A Other tear of medial meniscus, current injury, right knee, initial encounter: Secondary | ICD-10-CM | POA: Diagnosis not present

## 2016-05-09 DIAGNOSIS — M25561 Pain in right knee: Secondary | ICD-10-CM

## 2016-05-12 ENCOUNTER — Other Ambulatory Visit: Payer: Self-pay

## 2016-05-12 ENCOUNTER — Ambulatory Visit (HOSPITAL_COMMUNITY): Payer: Medicare Other | Attending: Cardiology

## 2016-05-12 DIAGNOSIS — I351 Nonrheumatic aortic (valve) insufficiency: Secondary | ICD-10-CM | POA: Insufficient documentation

## 2016-05-12 DIAGNOSIS — I10 Essential (primary) hypertension: Secondary | ICD-10-CM

## 2016-05-12 DIAGNOSIS — I071 Rheumatic tricuspid insufficiency: Secondary | ICD-10-CM | POA: Insufficient documentation

## 2016-05-12 DIAGNOSIS — R011 Cardiac murmur, unspecified: Secondary | ICD-10-CM

## 2016-05-12 DIAGNOSIS — I340 Nonrheumatic mitral (valve) insufficiency: Secondary | ICD-10-CM | POA: Insufficient documentation

## 2016-05-12 DIAGNOSIS — I2510 Atherosclerotic heart disease of native coronary artery without angina pectoris: Secondary | ICD-10-CM | POA: Insufficient documentation

## 2016-05-12 DIAGNOSIS — I119 Hypertensive heart disease without heart failure: Secondary | ICD-10-CM | POA: Insufficient documentation

## 2016-05-15 DIAGNOSIS — M25461 Effusion, right knee: Secondary | ICD-10-CM | POA: Diagnosis not present

## 2016-05-15 DIAGNOSIS — S83203A Other tear of unspecified meniscus, current injury, right knee, initial encounter: Secondary | ICD-10-CM | POA: Diagnosis not present

## 2016-05-15 DIAGNOSIS — Z23 Encounter for immunization: Secondary | ICD-10-CM | POA: Diagnosis not present

## 2016-05-15 DIAGNOSIS — M25569 Pain in unspecified knee: Secondary | ICD-10-CM | POA: Diagnosis not present

## 2016-05-19 DIAGNOSIS — M23331 Other meniscus derangements, other medial meniscus, right knee: Secondary | ICD-10-CM | POA: Diagnosis not present

## 2016-05-19 DIAGNOSIS — M1711 Unilateral primary osteoarthritis, right knee: Secondary | ICD-10-CM | POA: Diagnosis not present

## 2016-06-19 DIAGNOSIS — M23321 Other meniscus derangements, posterior horn of medial meniscus, right knee: Secondary | ICD-10-CM | POA: Diagnosis not present

## 2016-06-19 DIAGNOSIS — M1711 Unilateral primary osteoarthritis, right knee: Secondary | ICD-10-CM | POA: Diagnosis not present

## 2016-06-25 ENCOUNTER — Telehealth: Payer: Self-pay | Admitting: Cardiovascular Disease

## 2016-06-25 NOTE — Telephone Encounter (Signed)
I left a voicemail on the pt's answering machine that the surgeon's office has to contact us by phone or fax in regards to requesting surgical clearance and clearance to hold medication prior to surgery.

## 2016-06-25 NOTE — Telephone Encounter (Signed)
Walk In Pt Form-Pt On Blood Thinner/Having Surgery Dec 5th please call -gave to [name]

## 2016-07-03 NOTE — Addendum Note (Signed)
Addended by: Burton ApleyPETTY, Thanos Cousineau A on: 07/03/2016 02:06 PM   Modules accepted: Orders

## 2016-07-17 HISTORY — PX: KNEE SURGERY: SHX244

## 2016-07-21 DIAGNOSIS — M958 Other specified acquired deformities of musculoskeletal system: Secondary | ICD-10-CM | POA: Diagnosis not present

## 2016-07-21 DIAGNOSIS — M94261 Chondromalacia, right knee: Secondary | ICD-10-CM | POA: Diagnosis not present

## 2016-07-21 DIAGNOSIS — M23321 Other meniscus derangements, posterior horn of medial meniscus, right knee: Secondary | ICD-10-CM | POA: Diagnosis not present

## 2016-07-21 DIAGNOSIS — M23231 Derangement of other medial meniscus due to old tear or injury, right knee: Secondary | ICD-10-CM | POA: Diagnosis not present

## 2016-07-21 DIAGNOSIS — M1711 Unilateral primary osteoarthritis, right knee: Secondary | ICD-10-CM | POA: Diagnosis not present

## 2016-07-21 DIAGNOSIS — M23221 Derangement of posterior horn of medial meniscus due to old tear or injury, right knee: Secondary | ICD-10-CM | POA: Diagnosis not present

## 2016-07-21 DIAGNOSIS — M23331 Other meniscus derangements, other medial meniscus, right knee: Secondary | ICD-10-CM | POA: Diagnosis not present

## 2016-07-21 DIAGNOSIS — S83242A Other tear of medial meniscus, current injury, left knee, initial encounter: Secondary | ICD-10-CM | POA: Diagnosis not present

## 2016-07-21 DIAGNOSIS — G8918 Other acute postprocedural pain: Secondary | ICD-10-CM | POA: Diagnosis not present

## 2016-09-17 DIAGNOSIS — Z9889 Other specified postprocedural states: Secondary | ICD-10-CM | POA: Diagnosis not present

## 2016-09-17 DIAGNOSIS — Z4789 Encounter for other orthopedic aftercare: Secondary | ICD-10-CM | POA: Diagnosis not present

## 2016-10-27 DIAGNOSIS — E7800 Pure hypercholesterolemia, unspecified: Secondary | ICD-10-CM | POA: Diagnosis not present

## 2016-12-22 LAB — GLUCOSE, POCT (MANUAL RESULT ENTRY): POC Glucose: 114 mg/dl — AB (ref 70–99)

## 2016-12-27 ENCOUNTER — Encounter: Payer: Self-pay | Admitting: *Deleted

## 2016-12-27 DIAGNOSIS — Z139 Encounter for screening, unspecified: Secondary | ICD-10-CM

## 2016-12-28 ENCOUNTER — Other Ambulatory Visit: Payer: Self-pay

## 2016-12-28 DIAGNOSIS — I359 Nonrheumatic aortic valve disorder, unspecified: Secondary | ICD-10-CM

## 2016-12-30 DIAGNOSIS — Z0000 Encounter for general adult medical examination without abnormal findings: Secondary | ICD-10-CM | POA: Diagnosis not present

## 2016-12-30 DIAGNOSIS — E7800 Pure hypercholesterolemia, unspecified: Secondary | ICD-10-CM | POA: Diagnosis not present

## 2016-12-30 DIAGNOSIS — I10 Essential (primary) hypertension: Secondary | ICD-10-CM | POA: Diagnosis not present

## 2016-12-30 DIAGNOSIS — E559 Vitamin D deficiency, unspecified: Secondary | ICD-10-CM | POA: Diagnosis not present

## 2016-12-30 DIAGNOSIS — E039 Hypothyroidism, unspecified: Secondary | ICD-10-CM | POA: Diagnosis not present

## 2017-01-04 DIAGNOSIS — Z0001 Encounter for general adult medical examination with abnormal findings: Secondary | ICD-10-CM | POA: Diagnosis not present

## 2017-01-28 DIAGNOSIS — M79642 Pain in left hand: Secondary | ICD-10-CM | POA: Diagnosis not present

## 2017-01-28 DIAGNOSIS — M65332 Trigger finger, left middle finger: Secondary | ICD-10-CM | POA: Diagnosis not present

## 2017-01-29 DIAGNOSIS — M5442 Lumbago with sciatica, left side: Secondary | ICD-10-CM | POA: Diagnosis not present

## 2017-02-08 DIAGNOSIS — M5442 Lumbago with sciatica, left side: Secondary | ICD-10-CM | POA: Diagnosis not present

## 2017-02-11 DIAGNOSIS — M5442 Lumbago with sciatica, left side: Secondary | ICD-10-CM | POA: Diagnosis not present

## 2017-02-19 DIAGNOSIS — M5442 Lumbago with sciatica, left side: Secondary | ICD-10-CM | POA: Diagnosis not present

## 2017-02-24 ENCOUNTER — Encounter: Payer: Self-pay | Admitting: Family

## 2017-02-24 DIAGNOSIS — M65332 Trigger finger, left middle finger: Secondary | ICD-10-CM | POA: Diagnosis not present

## 2017-02-24 DIAGNOSIS — M79642 Pain in left hand: Secondary | ICD-10-CM | POA: Diagnosis not present

## 2017-03-04 DIAGNOSIS — M5442 Lumbago with sciatica, left side: Secondary | ICD-10-CM | POA: Diagnosis not present

## 2017-03-09 ENCOUNTER — Ambulatory Visit (INDEPENDENT_AMBULATORY_CARE_PROVIDER_SITE_OTHER): Payer: Medicare Other | Admitting: Family

## 2017-03-09 ENCOUNTER — Encounter: Payer: Self-pay | Admitting: Family

## 2017-03-09 ENCOUNTER — Ambulatory Visit (HOSPITAL_COMMUNITY)
Admission: RE | Admit: 2017-03-09 | Discharge: 2017-03-09 | Disposition: A | Discharge status: Home / Self Care | Payer: Medicare Other | Source: Ambulatory Visit | Attending: Vascular Surgery | Admitting: Vascular Surgery

## 2017-03-09 VITALS — BP 140/60 | HR 51 | Temp 97.0°F | Resp 16 | Ht 63.0 in | Wt 135.0 lb

## 2017-03-09 DIAGNOSIS — I6523 Occlusion and stenosis of bilateral carotid arteries: Secondary | ICD-10-CM

## 2017-03-09 LAB — VAS US CAROTID
LCCADSYS: 80 cm/s
LEFT ECA DIAS: -2 cm/s
LEFT VERTEBRAL DIAS: -3 cm/s
LICADSYS: -84 cm/s
Left CCA dist dias: 12 cm/s
Left CCA prox dias: 13 cm/s
Left CCA prox sys: 84 cm/s
Left ICA dist dias: -21 cm/s
RCCAPSYS: 79 cm/s
RIGHT CCA MID DIAS: 11 cm/s
RIGHT VERTEBRAL DIAS: 13 cm/s
Right CCA prox dias: 11 cm/s
Right cca dist sys: -101 cm/s

## 2017-03-09 NOTE — Patient Instructions (Signed)
Stroke Prevention Some medical conditions and behaviors are associated with an increased chance of having a stroke. You may prevent a stroke by making healthy choices and managing medical conditions. How can I reduce my risk of having a stroke?  Stay physically active. Get at least 30 minutes of activity on most or all days.  Do not smoke. It may also be helpful to avoid exposure to secondhand smoke.  Limit alcohol use. Moderate alcohol use is considered to be: ? No more than 2 drinks per day for men. ? No more than 1 drink per day for nonpregnant women.  Eat healthy foods. This involves: ? Eating 5 or more servings of fruits and vegetables a day. ? Making dietary changes that address high blood pressure (hypertension), high cholesterol, diabetes, or obesity.  Manage your cholesterol levels. ? Making food choices that are high in fiber and low in saturated fat, trans fat, and cholesterol may control cholesterol levels. ? Take any prescribed medicines to control cholesterol as directed by your health care provider.  Manage your diabetes. ? Controlling your carbohydrate and sugar intake is recommended to manage diabetes. ? Take any prescribed medicines to control diabetes as directed by your health care provider.  Control your hypertension. ? Making food choices that are low in salt (sodium), saturated fat, trans fat, and cholesterol is recommended to manage hypertension. ? Ask your health care provider if you need treatment to lower your blood pressure. Take any prescribed medicines to control hypertension as directed by your health care provider. ? If you are 18-39 years of age, have your blood pressure checked every 3-5 years. If you are 40 years of age or older, have your blood pressure checked every year.  Maintain a healthy weight. ? Reducing calorie intake and making food choices that are low in sodium, saturated fat, trans fat, and cholesterol are recommended to manage  weight.  Stop drug abuse.  Avoid taking birth control pills. ? Talk to your health care provider about the risks of taking birth control pills if you are over 35 years old, smoke, get migraines, or have ever had a blood clot.  Get evaluated for sleep disorders (sleep apnea). ? Talk to your health care provider about getting a sleep evaluation if you snore a lot or have excessive sleepiness.  Take medicines only as directed by your health care provider. ? For some people, aspirin or blood thinners (anticoagulants) are helpful in reducing the risk of forming abnormal blood clots that can lead to stroke. If you have the irregular heart rhythm of atrial fibrillation, you should be on a blood thinner unless there is a good reason you cannot take them. ? Understand all your medicine instructions.  Make sure that other conditions (such as anemia or atherosclerosis) are addressed. Get help right away if:  You have sudden weakness or numbness of the face, arm, or leg, especially on one side of the body.  Your face or eyelid droops to one side.  You have sudden confusion.  You have trouble speaking (aphasia) or understanding.  You have sudden trouble seeing in one or both eyes.  You have sudden trouble walking.  You have dizziness.  You have a loss of balance or coordination.  You have a sudden, severe headache with no known cause.  You have new chest pain or an irregular heartbeat. Any of these symptoms may represent a serious problem that is an emergency. Do not wait to see if the symptoms will go away.   Get medical help at once. Call your local emergency services (911 in [place] Do not drive yourself to the hospital. This information is not intended to replace advice given to you by your health care provider. Make sure you discuss any questions you have with your health care provider. Document Released: 09/10/2004 Document Revised: 01/09/2016 Document Reviewed: 02/03/2013 Elsevier  Interactive Patient Education  2017 Elsevier Inc.     Preventing Cerebrovascular Disease Arteries are blood vessels that carry blood that contains oxygen from the heart to all parts of the body. Cerebrovascular disease affects arteries that supply the brain. Any condition that blocks or disrupts blood flow to the brain can cause cerebrovascular disease. Brain cells that lose blood supply start to die within minutes (stroke). Stroke is the main danger of cerebrovascular disease. Atherosclerosis and high blood pressure are common causes of cerebrovascular disease. Atherosclerosis is narrowing and hardening of an artery that results when fat, cholesterol, calcium, or other substances (plaque) build up inside an artery. Plaque reduces blood flow through the artery. High blood pressure increases the risk of bleeding inside the brain. Making diet and lifestyle changes to prevent atherosclerosis and high blood pressure lowers your risk of cerebrovascular disease. What nutrition changes can be made?  Eat more fruits, vegetables, and whole grains.  Reduce how much saturated fat you eat. To do this, eat less red meat and fewer full-fat dairy products.  Eat healthy proteins instead of red meat. Healthy proteins include: ? Fish. Eat fish that contains heart-healthy omega-3 fatty acids, twice a week. Examples include salmon, albacore tuna, mackerel, and herring. ? Chicken. ? Nuts. ? Low-fat or nonfat yogurt.  Avoid processed meats, like bacon and lunchmeat.  Avoid foods that contain: ? A lot of sugar, such as sweets and drinks with added sugar. ? A lot of salt (sodium). Avoid adding extra salt to your food, as told by your health care provider. ? Trans fats, such as margarine and baked goods. Trans fats may be listed as "partially hydrogenated oils" on food labels.  Check food labels to see how much sodium, sugar, and trans fats are in foods.  Use vegetable oils that contain low amounts of  saturated fat, such as olive oil or canola oil. What lifestyle changes can be made?  Drink alcohol in moderation. This means no more than 1 drink a day for nonpregnant women and 2 drinks a day for men. One drink equals 12 oz of beer, 5 oz of wine, or 1 oz of hard liquor.  If you are overweight, ask your health care provider to recommend a weight-loss plan for you. Losing 5-10 lb (2.2-4.5 kg) can reduce your risk of diabetes, atherosclerosis, and high blood pressure.  Exercise for 30?60 minutes on most days, or as much as told by your health care provider. ? Do moderate-intensity exercise, such as brisk walking, bicycling, and water aerobics. Ask your health care provider which activities are safe for you.  Do not use any products that contain nicotine or tobacco, such as cigarettes and e-cigarettes. If you need help quitting, ask your health care provider. Why are these changes important? Making these changes lowers your risk of many diseases that can cause cerebrovascular disease and stroke. Stroke is a leading cause of death and disability. Making these changes also improves your overall health and quality of life. What can I do to lower my risk? The following factors make you more likely to develop cerebrovascular disease:  Being overweight.  Smoking.  Being physically inactive.    Eating a high-fat diet.  Having certain health conditions, such as: ? Diabetes. ? High blood pressure. ? Heart disease. ? Atherosclerosis. ? High cholesterol. ? Sickle cell disease.  Talk with your health care provider about your risk for cerebrovascular disease. Work with your health care provider to control diseases that you have that may contribute to cerebrovascular disease. Your health care provider may prescribe medicines to help prevent major causes of cerebrovascular disease. Where to find more information: Learn more about preventing cerebrovascular disease from:  National Heart, Lung, and  Blood Institute: www.nhlbi.nih.gov/health/health-topics/topics/stroke  Centers for Disease Control and Prevention: cdc.gov/stroke/about.htm  Summary  Cerebrovascular disease can lead to a stroke.  Atherosclerosis and high blood pressure are major causes of cerebrovascular disease.  Making diet and lifestyle changes can reduce your risk of cerebrovascular disease.  Work with your health care provider to get your risk factors under control to reduce your risk of cerebrovascular disease. This information is not intended to replace advice given to you by your health care provider. Make sure you discuss any questions you have with your health care provider. Document Released: 08/18/2015 Document Revised: 02/21/2016 Document Reviewed: 08/18/2015 Elsevier Interactive Patient Education  2018 Elsevier Inc.  

## 2017-03-09 NOTE — Progress Notes (Signed)
Chief Complaint: Follow up Extracranial Carotid Artery Stenosis   History of Present Illness  Jennifer Russo is a 76 y.o. female patient of Dr. Arbie Cookey who has known extracranial carotid artery stenosis. She returns today for follow up.  Patient has not had previous carotid artery intervention.  Patient has no history of TIA or stroke symptoms; specifically she denies a history of amaurosis fugax or monocular blindness, unilateral facial drooping, hemiparesis,or receptive or expressive aphasia.   She has CAD with 2 stents, denies history of MI. She denies claudication symptoms in her legs with walking, denies non healing wounds.  She denies chest pain, denies dyspnea, denies a cough.  Pt Diabetic: No Pt smoker: non-smoker She exercises 4 days/week, does not use ETOH.  Pt meds include: Statin : Yes ASA: Yes Other anticoagulants/antiplatelets: Plavix    Past Medical History:  Diagnosis Date  . Asymptomatic carotid artery stenosis   . CAD (coronary artery disease)    s/p PCI- LAD and RCA with drug-eluting stents, PTCA in 2009 to treat in stent restenosis in RCA  . Essential hypertension   . HLD (hyperlipidemia)     Social History Social History  Substance Use Topics  . Smoking status: Never Smoker  . Smokeless tobacco: Never Used  . Alcohol use No    Family History Family History  Problem Relation Age of Onset  . Hypertension Mother   . Diabetes Father     Surgical History Past Surgical History:  Procedure Laterality Date  . CORONARY ANGIOPLASTY  03-30-05 / 04-14-05  . CORONARY STENT PLACEMENT    . FEMORAL ARTERY REPAIR Right 07-03-08   Groin area  . KNEE SURGERY  07/2016    Allergies  Allergen Reactions  . Penicillins Other (See Comments)    Doesn't recall  . Sulfonamide Derivatives Other (See Comments)    Doesn't recall  . Tramadol     Current Outpatient Prescriptions  Medication Sig Dispense Refill  . amLODipine (NORVASC) 10 MG tablet  Take 10 mg by mouth daily.    . Ascorbic Acid (VITAMIN C) 1000 MG tablet Take 1,000 mg by mouth daily.      Marland Kitchen aspirin (ASPIR-81) 81 MG EC tablet Take 1 tablet (81 mg total) by mouth every evening.    . calcium carbonate (CALCIUM 600) 600 MG TABS Take 600 mg by mouth 2 (two) times daily with a meal.    . Cholecalciferol (VITAMIN D) 2000 UNITS CAPS Take 2,000 Units by mouth daily.    . clopidogrel (PLAVIX) 75 MG tablet Take 75 mg by mouth daily.      Marland Kitchen levothyroxine (SYNTHROID, LEVOTHROID) 50 MCG tablet Take 50 mcg by mouth daily.    . metoprolol (LOPRESSOR) 50 MG tablet Take 50 mg by mouth 2 (two) times daily.      . Multiple Vitamins-Minerals (MULTIVITAL) tablet Take 1 tablet by mouth daily.      . Omega-3 Fatty Acids (FISH OIL) 1000 MG CAPS Take 1,000 mg by mouth daily.     . rosuvastatin (CRESTOR) 20 MG tablet Take 20 mg by mouth daily.    Marland Kitchen telmisartan-hydrochlorothiazide (MICARDIS HCT) 80-25 MG per tablet Take 1 tablet by mouth daily.     No current facility-administered medications for this visit.     Review of Systems : See HPI for pertinent positives and negatives.  Physical Examination  Vitals:   03/09/17 1542 03/09/17 1544  BP: (!) 146/56 140/60  Pulse: (!) 51   Resp: 16   Temp: Marland Kitchen)  97 F (36.1 C)   TempSrc: Oral   SpO2: 97%   Weight: 135 lb (61.2 kg)   Height: 5\' 3"  (1.6 m)    Body mass index is 23.91 kg/m.  General: WDWN female in NAD GAIT: normal Eyes: PERRLA Pulmonary: Respirations are non-labored, +rales in left base, no rhonchi or wheezes  Cardiac: regular rhythm, bradycardic rate (on a beta blocker), + murmur.  VASCULAR EXAM Carotid Bruits Left Right   Transmitted cardiac murmur Transmitted cardiac murmur   Aorta is not palpable. Radial pulses are 2+ palpable and equal.      LE Pulses LEFT RIGHT    POPLITEAL not palpable  not palpable   POSTERIOR TIBIAL  palpable   palpable    DORSALIS PEDIS  ANTERIOR TIBIAL palpable  palpable     Gastrointestinal: soft, nontender, BS WNL, no r/g,no palpable masses.  Musculoskeletal: No muscle atrophy/wasting. M/S 5/5 throughout, Extremities without ischemic changes.  Neurologic: A&O X 3; appropriate affect,speech is normal, CN 2-12 intact, Pain and light touch intact in extremities, Motor exam as listed above     Assessment: Jennifer Russo is a 76 y.o. female who has no history of stroke or TIA.   Her atherosclerotic risk factors include CAD, dyslipidemia, and controlled hypertension. She takes a statin, ASA, and Plavix on a regular basis. Fortunately she does not have DM, has never used tobacco, and exercises most days of the week.  Rales continue in left base, were in both bases a year ago, but she denies cough or dyspnea, continues exercising.    DATA Carotid Duplex (03/09/17): Bilateral vertebral artery flow is antegrade.  Bilateral subclavian artery waveforms are normal.  >50%riht distal CCA stenosis with 1-39%ight ICA stenosis (increased ICA systolic velocities may be due to increased velocity in the distal CCA). Left ECA stenosis. Left proximal ICA stenosis at 1-39%, upper end of range.  Increased velocity in the left ECA and ICA since the exam on 03-09-16.    Plan:  Follow-up in 1 year with Carotid Duplex scan.   I discussed in depth with the patient the nature of atherosclerosis, and emphasized the importance of maximal medical management including strict control of blood pressure, blood glucose, and lipid levels, obtaining regular exercise, and continued cessation of smoking.  The patient is aware that without maximal medical management the underlying atherosclerotic disease process will progress, limiting the benefit of any interventions. The patient was given information about  stroke prevention and what symptoms should prompt the patient to seek immediate medical care. Thank you for allowing us to participate in this patient's care.  Charisse MarchSuzanne Nickel, RN, MSN, FNP-C [facility] Office: [phone]  Clinic Physician: Early  03/09/17 3:47 PM

## 2017-03-12 NOTE — Addendum Note (Signed)
Addended by: Burton ApleyPETTY, Ahmir Bracken A on: 03/12/2017 03:47 PM   Modules accepted: Orders

## 2017-03-25 DIAGNOSIS — H35372 Puckering of macula, left eye: Secondary | ICD-10-CM | POA: Diagnosis not present

## 2017-03-25 DIAGNOSIS — H353130 Nonexudative age-related macular degeneration, bilateral, stage unspecified: Secondary | ICD-10-CM | POA: Diagnosis not present

## 2017-04-30 ENCOUNTER — Other Ambulatory Visit: Payer: Self-pay

## 2017-04-30 ENCOUNTER — Ambulatory Visit (HOSPITAL_COMMUNITY): Payer: Medicare Other | Attending: Cardiovascular Disease

## 2017-04-30 DIAGNOSIS — I080 Rheumatic disorders of both mitral and aortic valves: Secondary | ICD-10-CM | POA: Diagnosis not present

## 2017-04-30 DIAGNOSIS — I420 Dilated cardiomyopathy: Secondary | ICD-10-CM | POA: Insufficient documentation

## 2017-04-30 DIAGNOSIS — I359 Nonrheumatic aortic valve disorder, unspecified: Secondary | ICD-10-CM | POA: Diagnosis not present

## 2017-04-30 LAB — ECHOCARDIOGRAM COMPLETE
AO mean calculated velocity dopler: 153 cm/s
AV Area VTI index: 1.07 cm2/m2
AV Area VTI: 1.73 cm2
AV Mean grad: 11 mmHg
AV Peak grad: 21 mmHg
AV VEL mean LVOT/AV: 0.51
AV peak Index: 1.05
AVAREAMEANV: 1.61 cm2
AVAREAMEANVIN: 0.98 cm2/m2
AVPKVEL: 227 cm/s
Ao pk vel: 0.55 m/s
CHL CUP AV VALUE AREA INDEX: 1.07
CHL CUP AV VEL: 1.76
CHL CUP DOP CALC LVOT VTI: 31.9 cm
CHL CUP TV REG PEAK VELOCITY: 262 cm/s
E decel time: 218 msec
E/e' ratio: 10.1
FS: 33 % (ref 28–44)
IVS/LV PW RATIO, ED: 1.12
LA diam end sys: 32 mm
LA diam index: 1.95 cm/m2
LA vol A4C: 57 ml
LA vol index: 39 mL/m2
LA vol: 64 mL
LASIZE: 32 mm
LDCA: 3.14 cm2
LV E/e' medial: 10.1
LV E/e'average: 10.1
LV PW d: 12.8 mm — AB (ref 0.6–1.1)
LV e' LATERAL: 10.5 cm/s
LVOT SV: 100 mL
LVOT diameter: 20 mm
LVOT peak VTI: 0.56 cm
LVOT peak grad rest: 6 mmHg
LVOTPV: 125 cm/s
MV Dec: 218
MVPG: 4 mmHg
MVPKAVEL: 105 m/s
MVPKEVEL: 106 m/s
P 1/2 time: 432 ms
TDI e' lateral: 10.5
TDI e' medial: 7.68
TRMAXVEL: 262 cm/s
VTI: 57 cm
Valve area: 1.76 cm2

## 2017-05-06 ENCOUNTER — Ambulatory Visit (INDEPENDENT_AMBULATORY_CARE_PROVIDER_SITE_OTHER): Payer: Medicare Other | Admitting: Cardiovascular Disease

## 2017-05-06 ENCOUNTER — Encounter: Payer: Self-pay | Admitting: Cardiovascular Disease

## 2017-05-06 VITALS — BP 124/50 | HR 53 | Ht 63.0 in | Wt 136.4 lb

## 2017-05-06 DIAGNOSIS — I10 Essential (primary) hypertension: Secondary | ICD-10-CM

## 2017-05-06 DIAGNOSIS — I2510 Atherosclerotic heart disease of native coronary artery without angina pectoris: Secondary | ICD-10-CM

## 2017-05-06 NOTE — Progress Notes (Signed)
Cardiology Office Note Date:  05/06/2017   ID:  Jennifer Russo 1940/11/09, MRN [id number]  PCP:  Merri Brunette, MD  Cardiologist:  Tonny Bollman, MD    Chief Complaint  Patient presents with  . Follow-up    1 month     History of Present Illness: Jennifer Russo is a 76 y.o. female who presents for follow-up evaluation. She has CAD andhas undergone PCI, most recently in 2009. Her last nuclear scan was in 2012 and this demonstrated normal myocardial perfusion. Notably she does have an abnormal exercise EKG which has been documented several times in the past. She has developed mild-moderate aortic stenosis and recently underwent a 2D echocardiogram demonstrating stable findings. She's also followed for hypertension, carotid artery disease, and hyperlipidemia.  The patient is here alone today. Today, she denies symptoms of palpitations, chest pain, shortness of breath, orthopnea, PND, lower extremity edema, dizziness, or syncope. She exercises regularly without exertional symptoms.   Past Medical History:  Diagnosis Date  . Asymptomatic carotid artery stenosis   . CAD (coronary artery disease)    s/p PCI- LAD and RCA with drug-eluting stents, PTCA in 2009 to treat in stent restenosis in RCA  . Essential hypertension   . HLD (hyperlipidemia)     Past Surgical History:  Procedure Laterality Date  . CORONARY ANGIOPLASTY  03-30-05 / 04-14-05  . CORONARY STENT PLACEMENT    . FEMORAL ARTERY REPAIR Right 07-03-08   Groin area  . KNEE SURGERY  07/2016    Current Outpatient Prescriptions  Medication Sig Dispense Refill  . amLODipine (NORVASC) 10 MG tablet Take 10 mg by mouth daily.    . Ascorbic Acid (VITAMIN C) 1000 MG tablet Take 1,000 mg by mouth daily.      Marland Kitchen aspirin (ASPIR-81) 81 MG EC tablet Take 1 tablet (81 mg total) by mouth every evening.    . calcium carbonate (CALCIUM 600) 600 MG TABS Take 600 mg by mouth 2 (two) times daily with a meal.    . Cholecalciferol (VITAMIN  D) 2000 UNITS CAPS Take 2,000 Units by mouth daily.    . clopidogrel (PLAVIX) 75 MG tablet Take 75 mg by mouth daily.      Marland Kitchen levothyroxine (SYNTHROID, LEVOTHROID) 50 MCG tablet Take 50 mcg by mouth daily.    . metoprolol (LOPRESSOR) 50 MG tablet Take 50 mg by mouth 2 (two) times daily.      . Multiple Vitamins-Minerals (MULTIVITAL) tablet Take 1 tablet by mouth daily.      . Omega-3 Fatty Acids (FISH OIL) 1000 MG CAPS Take 1,000 mg by mouth daily.     . rosuvastatin (CRESTOR) 20 MG tablet Take 20 mg by mouth daily.    Marland Kitchen telmisartan-hydrochlorothiazide (MICARDIS HCT) 80-25 MG per tablet Take 1 tablet by mouth daily.     No current facility-administered medications for this visit.     Allergies:   Penicillins; Sulfonamide derivatives; and Tramadol   Social History:  The patient  reports that she has never smoked. She has never used smokeless tobacco. She reports that she does not drink alcohol or use drugs.   Family History:  The patient's family history includes Diabetes in her father; Hypertension in her mother.   ROS:  Please see the history of present illness.   All other systems are reviewed and negative.   PHYSICAL EXAM: VS:  BP (!) 124/50   Pulse (!) 53   Ht  (1.6 m)   Wt 61.9 kg (  136 lb 6.4 oz)   BMI 24.16 kg/m  , BMI Body mass index is 24.16 kg/m. GEN: Well nourished, well developed, pleasant elderly woman in no acute distress  HEENT: normal  Neck: no JVD, no masses. Bilateral carotid bruits Cardiac: RRR with 2/6 systolic murmur at the RUSB, no diastolic murmur               Respiratory:  clear to auscultation bilaterally, normal work of breathing GI: soft, nontender, nondistended, + BS MS: no deformity or atrophy  Ext: no pretibial edema, pedal pulses 2+= bilaterally Skin: warm and dry, no rash Neuro:  Strength and sensation are intact Psych: euthymic mood, full affect  EKG:  EKG is ordered today. The ekg ordered today shows Sinus bradycardia 53 bpm, first-degree  AV block, voltage criteria for LVH.  Recent Labs: No results found for requested labs within last 8760 hours.   Lipid Panel  No results found for: CHOL, TRIG, HDL, CHOLHDL, VLDL, LDLCALC, LDLDIRECT    Wt Readings from Last 3 Encounters:  05/06/17 61.9 kg (136 lb 6.4 oz)  03/09/17 61.2 kg (135 lb)  04/30/16 61.8 kg (136 lb 3.2 oz)     Cardiac Studies Reviewed: 2D Echo: Left ventricle:  The cavity size was normal. Wall thickness was normal. Systolic function was vigorous. The estimated ejection fraction was in the range of 65% to 70%.  ------------------------------------------------------------------- Aortic valve:  AV is thickened, calcified with minimally restricted motion. Peak and mean gradients through the valve are 20 and 10 mm Hg respectively.  Moderately thickened, mildly calcified leaflets. Doppler:  There was mild to moderate regurgitation.    VTI ratio of LVOT to aortic valve: 0.56. Valve area (VTI): 1.76 cm^2. Indexed valve area (VTI): 1.06 cm^2/m^2. Peak velocity ratio of LVOT to aortic valve: 0.55. Valve area (Vmax): 1.73 cm^2. Indexed valve area (Vmax): 1.04 cm^2/m^2. Mean velocity ratio of LVOT to aortic valve: 0.51. Valve area (Vmean): 1.61 cm^2. Indexed valve area (Vmean): 0.97 cm^2/m^2.    Mean gradient (S): 11 mm Hg. Peak gradient (S): 21 mm Hg.  ------------------------------------------------------------------- Mitral valve:   Calcified annulus. Mildly thickened leaflets . Doppler:  There was mild regurgitation.    Peak gradient (D): 4 mm Hg.  ------------------------------------------------------------------- Left atrium:  The atrium was mildly dilated.  ------------------------------------------------------------------- Right ventricle:  The cavity size was normal. Wall thickness was normal. Systolic function was normal.  ------------------------------------------------------------------- Pulmonic valve:    Structurally normal valve.   Cusp  separation was normal.  Doppler:  Transvalvular velocity was within the normal range. There was trivial regurgitation.  ------------------------------------------------------------------- Tricuspid valve:   Structurally normal valve.   Leaflet separation was normal.  Doppler:  Transvalvular velocity was within the normal range. There was mild regurgitation.  ------------------------------------------------------------------- Right atrium:  The atrium was normal in size.  ------------------------------------------------------------------- Pericardium:  There was no pericardial effusion.  ASSESSMENT AND PLAN: 1.  Aortic stenosis, mild-to-moderate: Echo study reviewed. Patient asymptomatic. Her exam is consistent with no more than moderate aortic stenosis. Will continue with clinical follow-up in 1 year. Anticipate a repeat echocardiogram in approximately 2 years unless symptoms arise. Discussed natural history of aortic stenosis with the patient today at length.  2. Carotid artery stenosis: Followed by [facility]. Most recent carotid Doppler reviewed.  3. Coronary artery disease, native vessel, without angina: The patient demonstrates continued stability with her coronary disease. She has no exertional symptoms and she exercises regularly. Her medicines will be continued.  4. Hypertension: Blood pressure is well controlled on  a combination of amlodipine, metoprolol, and Micardis HCT  5. Hyperlipidemia: Treated with Crestor 20 mg. LDL cholesterol is 75.  Current medicines are reviewed with the patient today.  The patient does not have concerns regarding medicines.  Labs/ tests ordered today include:   Orders Placed This Encounter  Procedures  . EKG 12-Lead    Disposition:   FU one year  Signed, Tonny Bollman, MD  05/06/2017 5:38 PM    [facility] [address] [place], [place], [place]  [address] Phone: [phone]; Fax: [phone]

## 2017-05-06 NOTE — Patient Instructions (Signed)

## 2017-10-01 DIAGNOSIS — H353130 Nonexudative age-related macular degeneration, bilateral, stage unspecified: Secondary | ICD-10-CM | POA: Diagnosis not present

## 2017-10-01 DIAGNOSIS — H35373 Puckering of macula, bilateral: Secondary | ICD-10-CM | POA: Diagnosis not present

## 2017-11-19 DIAGNOSIS — B029 Zoster without complications: Secondary | ICD-10-CM | POA: Diagnosis not present

## 2017-11-19 DIAGNOSIS — Z8619 Personal history of other infectious and parasitic diseases: Secondary | ICD-10-CM | POA: Diagnosis not present

## 2017-11-19 DIAGNOSIS — E782 Mixed hyperlipidemia: Secondary | ICD-10-CM | POA: Diagnosis not present

## 2017-11-19 DIAGNOSIS — I10 Essential (primary) hypertension: Secondary | ICD-10-CM | POA: Diagnosis not present

## 2017-11-22 DIAGNOSIS — B029 Zoster without complications: Secondary | ICD-10-CM | POA: Diagnosis not present

## 2017-12-09 DIAGNOSIS — Z8619 Personal history of other infectious and parasitic diseases: Secondary | ICD-10-CM | POA: Diagnosis not present

## 2017-12-09 DIAGNOSIS — B0229 Other postherpetic nervous system involvement: Secondary | ICD-10-CM | POA: Diagnosis not present

## 2018-01-06 DIAGNOSIS — E782 Mixed hyperlipidemia: Secondary | ICD-10-CM | POA: Diagnosis not present

## 2018-01-06 DIAGNOSIS — E559 Vitamin D deficiency, unspecified: Secondary | ICD-10-CM | POA: Diagnosis not present

## 2018-01-06 DIAGNOSIS — E039 Hypothyroidism, unspecified: Secondary | ICD-10-CM | POA: Diagnosis not present

## 2018-01-06 DIAGNOSIS — I10 Essential (primary) hypertension: Secondary | ICD-10-CM | POA: Diagnosis not present

## 2018-01-13 DIAGNOSIS — Z0000 Encounter for general adult medical examination without abnormal findings: Secondary | ICD-10-CM | POA: Diagnosis not present

## 2018-01-13 DIAGNOSIS — R7303 Prediabetes: Secondary | ICD-10-CM | POA: Diagnosis not present

## 2018-01-13 DIAGNOSIS — I10 Essential (primary) hypertension: Secondary | ICD-10-CM | POA: Diagnosis not present

## 2018-01-13 DIAGNOSIS — E782 Mixed hyperlipidemia: Secondary | ICD-10-CM | POA: Diagnosis not present

## 2018-01-13 DIAGNOSIS — E039 Hypothyroidism, unspecified: Secondary | ICD-10-CM | POA: Diagnosis not present

## 2018-02-01 DIAGNOSIS — L438 Other lichen planus: Secondary | ICD-10-CM | POA: Diagnosis not present

## 2018-02-09 DIAGNOSIS — L638 Other alopecia areata: Secondary | ICD-10-CM | POA: Diagnosis not present

## 2018-02-09 DIAGNOSIS — L438 Other lichen planus: Secondary | ICD-10-CM | POA: Diagnosis not present

## 2018-03-09 DIAGNOSIS — L638 Other alopecia areata: Secondary | ICD-10-CM | POA: Diagnosis not present

## 2018-03-09 MED FILL — SHINGRIX 50 MCG SUS: 50 | 1 days supply | Qty: 1 | Fill #0

## 2018-03-15 ENCOUNTER — Encounter: Payer: Self-pay | Admitting: Family

## 2018-03-15 ENCOUNTER — Other Ambulatory Visit: Payer: Self-pay

## 2018-03-15 ENCOUNTER — Ambulatory Visit: Payer: Medicare Other | Admitting: Family

## 2018-03-15 ENCOUNTER — Ambulatory Visit (HOSPITAL_COMMUNITY)
Admission: RE | Admit: 2018-03-15 | Discharge: 2018-03-15 | Disposition: A | Discharge status: Home / Self Care | Payer: Medicare Other | Source: Ambulatory Visit | Attending: Vascular Surgery | Admitting: Vascular Surgery

## 2018-03-15 VITALS — BP 142/63 | HR 57 | Temp 97.2°F | Resp 16 | Ht 63.0 in | Wt 130.0 lb

## 2018-03-15 DIAGNOSIS — E785 Hyperlipidemia, unspecified: Secondary | ICD-10-CM | POA: Diagnosis not present

## 2018-03-15 DIAGNOSIS — I6523 Occlusion and stenosis of bilateral carotid arteries: Secondary | ICD-10-CM

## 2018-03-15 DIAGNOSIS — I10 Essential (primary) hypertension: Secondary | ICD-10-CM | POA: Diagnosis not present

## 2018-03-15 NOTE — Progress Notes (Signed)
Chief Complaint: Follow up Extracranial Carotid Artery Stenosis   History of Present Illness  Jennifer Russo is a 77 y.o. female whom Dr. Arbie CookeyEarly has been monitoring for extracranial carotid artery stenosis. She returns today for follow up.  Patient has not had previous carotid artery intervention.  In 2000 she states she had a small stroke while in [place], states MRI of her brain found evidence of this.  Her sx's were dizziness, vomiting, and left hemiparesis. She had acupuncture and physical therapy which helped.  She denies any speech or vision changes with the stroke.   She has CAD with 2 stents, denies history of MI. She denies claudication symptoms in her legs with walking, denies non healing wounds.  She denies chest pain, denies dyspnea, denies a cough.  Diabetic: No Tobacco use: non-smoker She exercises 4 days/week, does not use ETOH.  Pt meds include: Statin : Yes ASA: Yes Other anticoagulants/antiplatelets: Plavix    Past Medical History:  Diagnosis Date  . Asymptomatic carotid artery stenosis   . CAD (coronary artery disease)    s/p PCI- LAD and RCA with drug-eluting stents, PTCA in 2009 to treat in stent restenosis in RCA  . Essential hypertension   . HLD (hyperlipidemia)     Social History Social History   Tobacco Use  . Smoking status: Never Smoker  . Smokeless tobacco: Never Used  Substance Use Topics  . Alcohol use: No    Alcohol/week: 0.0 oz  . Drug use: No    Family History Family History  Problem Relation Age of Onset  . Hypertension Mother   . Diabetes Father     Surgical History Past Surgical History:  Procedure Laterality Date  . CORONARY ANGIOPLASTY  03-30-05 / 04-14-05  . CORONARY STENT PLACEMENT    . FEMORAL ARTERY REPAIR Right 07-03-08   Groin area  . KNEE SURGERY  07/2016    Allergies  Allergen Reactions  . Penicillins Other (See Comments)    Doesn't recall  . Sulfonamide Derivatives Other (See Comments)    Doesn't  recall  . Tramadol     Current Outpatient Medications  Medication Sig Dispense Refill  . amLODipine (NORVASC) 10 MG tablet Take 10 mg by mouth daily.    . Ascorbic Acid (VITAMIN C) 1000 MG tablet Take 1,000 mg by mouth daily.      Marland Kitchen. aspirin (ASPIR-81) 81 MG EC tablet Take 1 tablet (81 mg total) by mouth every evening.    . calcium carbonate (CALCIUM 600) 600 MG TABS Take 600 mg by mouth 2 (two) times daily with a meal.    . Cholecalciferol (VITAMIN D) 2000 UNITS CAPS Take 2,000 Units by mouth daily.    . clopidogrel (PLAVIX) 75 MG tablet Take 75 mg by mouth daily.      Marland Kitchen. levothyroxine (SYNTHROID, LEVOTHROID) 50 MCG tablet Take 50 mcg by mouth daily.    . metoprolol (LOPRESSOR) 50 MG tablet Take 50 mg by mouth 2 (two) times daily.      . Multiple Vitamins-Minerals (MULTIVITAL) tablet Take 1 tablet by mouth daily.      . Omega-3 Fatty Acids (FISH OIL) 1000 MG CAPS Take 1,000 mg by mouth daily.     . rosuvastatin (CRESTOR) 20 MG tablet Take 20 mg by mouth daily.    Marland Kitchen. telmisartan-hydrochlorothiazide (MICARDIS HCT) 80-25 MG per tablet Take 1 tablet by mouth daily.     No current facility-administered medications for this visit.     Review of Systems :  See HPI for pertinent positives and negatives.  Physical Examination  Vitals:   03/15/18 1129 03/15/18 1134  BP: (!) 143/60 (!) 142/63  Pulse: (!) 57   Resp: 16   Temp: (!) 97.2 F (36.2 C)   TempSrc: Oral   SpO2: 97%   Weight: 130 lb (59 kg)   Height: 5\' 3"  (1.6 m)    Body mass index is 23.03 kg/m.  General: WDWN female in NAD GAIT: normal Eyes: PERRLA HENT: No gross abnormalities.  Pulmonary:  Respirations are non-labored, good air movement in all fields,  +rales in left base, no rhonchi or wheezes   Cardiac: regular rhythm, bradycardic (on a beta blocker),+ murmur.   VASCULAR EXAM Carotid Bruits Right Left   Transmitted cardiac murmur Transmitted cardiac murmur      Abdominal aortic pulse is not palpable. Radial  pulses are 2+ palpable and equal.                                                                                                                            LE Pulses Right Left       POPLITEAL  not palpable   not palpable       POSTERIOR TIBIAL   palpable    palpable        DORSALIS PEDIS      ANTERIOR TIBIAL  palpable  palpable     Gastrointestinal: soft, nontender, BS WNL, no r/g,   palpable masses. Musculoskeletal: no muscle atrophy/wasting. M/S 5/5 throughout, extremities without ischemic changes. Skin: No rashes, no ulcers, no cellulitis.   Neurologic:  A&O X 3; appropriate affect, sensation is normal; speech is normal, CN 2-12 intact, pain and light touch intact in extremities, motor exam as listed above. Psychiatric: Normal thought content, mood appropriate to clinical situation.    Assessment: Jennifer Russo is a 77 y.o. female who had a stroke in 2000 while in [place], no subsequent stroke or TIA.   Her atherosclerotic risk factors include CAD, dyslipidemia, and controlled hypertension. She takes a statin, ASA, and Plavix on a regular basis. Fortunately she does not have DM, has never used tobacco, and exercises most days of the week.  Rales continue in left base, were in both bases 2 years ago, but she denies cough or dyspnea, continues exercising.    DATA Carotid Duplex (03-15-18): Bilateral ICA with 1-39% stenosis >50% right distal CCA stenosis  >50% stenosis of the bilateral ECA Bilateral vertebral artery flow is antegrade.  Bilateral subclavian artery waveforms are normal.  No change compared to the exam on 03-09-17.   Plan:  Follow-up in 1year with Carotid Duplex scan.   I discussed in depth with the patient the nature of atherosclerosis, and emphasized the importance of maximal medical management including strict control of blood pressure, blood glucose, and lipid levels, obtaining regular exercise, and continued cessation of smoking.  The patient is  aware that without maximal medical management the underlying atherosclerotic disease process  will progress, limiting the benefit of any interventions. The patient was given information about stroke prevention and what symptoms should prompt the patient to seek immediate medical care. Thank you for allowing [place] to participate in this patient's care.  Charisse March, RN, MSN, FNP-C [facility] Office: [phone]  Clinic Physician: Early  03/15/18 12:06 PM

## 2018-03-15 NOTE — Patient Instructions (Addendum)

## 2018-04-29 DIAGNOSIS — Z23 Encounter for immunization: Secondary | ICD-10-CM | POA: Diagnosis not present

## 2018-05-04 ENCOUNTER — Encounter: Payer: Self-pay | Admitting: Cardiovascular Disease

## 2018-05-04 ENCOUNTER — Ambulatory Visit (INDEPENDENT_AMBULATORY_CARE_PROVIDER_SITE_OTHER): Payer: Medicare Other | Admitting: Cardiovascular Disease

## 2018-05-04 VITALS — BP 108/50 | HR 55 | Ht 63.0 in | Wt 132.8 lb

## 2018-05-04 DIAGNOSIS — I350 Nonrheumatic aortic (valve) stenosis: Secondary | ICD-10-CM

## 2018-05-04 DIAGNOSIS — I10 Essential (primary) hypertension: Secondary | ICD-10-CM | POA: Diagnosis not present

## 2018-05-04 DIAGNOSIS — I2510 Atherosclerotic heart disease of native coronary artery without angina pectoris: Secondary | ICD-10-CM | POA: Diagnosis not present

## 2018-05-04 DIAGNOSIS — I6523 Occlusion and stenosis of bilateral carotid arteries: Secondary | ICD-10-CM

## 2018-05-04 NOTE — Progress Notes (Signed)
Cardiology Office Note:    Date:  05/04/2018   ID:  Jennifer Russo, Jennifer Russo Feb 07, 1941, MRN [id number]  PCP:  Merri Brunette, MD  Cardiologist:  Tonny Bollman, MD  Electrophysiologist:  None   Referring MD: Merri Brunette, MD   Chief Complaint  Patient presents with  . Follow-up    CAD    History of Present Illness:    Jennifer Russo is a 78 y.o. female with a hx of coronary artery disease, presenting for follow-up evaluation.  She was last seen 1 year ago.  The patient is also been followed for mild to moderate aortic stenosis, hypertension, carotid stenosis, and hyperlipidemia.  She has no history of stroke, heart attack, or TIA.  She's here with her husband today. Really doing quite well with no specific complaints. Today, she denies symptoms of palpitations, chest pain, shortness of breath, orthopnea, PND, lower extremity edema, dizziness, or syncope.  They walk at the gym 4 days/week. Only complaint seems to be generalized fatigue.  Past Medical History:  Diagnosis Date  . Asymptomatic carotid artery stenosis   . CAD (coronary artery disease)    s/p PCI- LAD and RCA with drug-eluting stents, PTCA in 2009 to treat in stent restenosis in RCA  . Essential hypertension   . HLD (hyperlipidemia)     Past Surgical History:  Procedure Laterality Date  . CORONARY ANGIOPLASTY  03-30-05 / 04-14-05  . CORONARY STENT PLACEMENT    . FEMORAL ARTERY REPAIR Right 07-03-08   Groin area  . KNEE SURGERY  07/2016    Current Medications: Current Meds  Medication Sig  . amLODipine (NORVASC) 10 MG tablet Take 10 mg by mouth daily.  . Ascorbic Acid (VITAMIN C) 1000 MG tablet Take 1,000 mg by mouth daily.    . calcium carbonate (CALCIUM 600) 600 MG TABS Take 600 mg by mouth 2 (two) times daily with a meal.  . Cholecalciferol (VITAMIN D) 2000 UNITS CAPS Take 2,000 Units by mouth daily.  . clopidogrel (PLAVIX) 75 MG tablet Take 75 mg by mouth daily.    Marland Kitchen levothyroxine (SYNTHROID, LEVOTHROID) 50  MCG tablet Take 50 mcg by mouth daily.  . metoprolol (LOPRESSOR) 50 MG tablet Take 50 mg by mouth 2 (two) times daily.    . Multiple Vitamins-Minerals (MULTIVITAL) tablet Take 1 tablet by mouth daily.    . Omega-3 Fatty Acids (FISH OIL) 1000 MG CAPS Take 1,000 mg by mouth daily.   . rosuvastatin (CRESTOR) 20 MG tablet Take 20 mg by mouth daily.  Marland Kitchen telmisartan-hydrochlorothiazide (MICARDIS HCT) 80-25 MG per tablet Take 1 tablet by mouth daily.  . [DISCONTINUED] aspirin (ASPIR-81) 81 MG EC tablet Take 1 tablet (81 mg total) by mouth every evening.     Allergies:   Penicillins; Sulfonamide derivatives; and Tramadol   Social History   Socioeconomic History  . Marital status: Married    Spouse name: Not on file  . Number of children: Not on file  . Years of education: Not on file  . Highest education level: Not on file  Occupational History  . Not on file  Social Needs  . Financial resource strain: Not on file  . Food insecurity:    Worry: Not on file    Inability: Not on file  . Transportation needs:    Medical: Not on file    Non-medical: Not on file  Tobacco Use  . Smoking status: Never Smoker  . Smokeless tobacco: Never Used  Substance and Sexual Activity  .  Alcohol use: No    Alcohol/week: 0.0 standard drinks  . Drug use: No  . Sexual activity: Not on file  Lifestyle  . Physical activity:    Days per week: Not on file    Minutes per session: Not on file  . Stress: Not on file  Relationships  . Social connections:    Talks on phone: Not on file    Gets together: Not on file    Attends religious service: Not on file    Active member of club or organization: Not on file    Attends meetings of clubs or organizations: Not on file    Relationship status: Not on file  Other Topics Concern  . Not on file  Social History Narrative  . Not on file     Family History: The patient's family history includes Diabetes in her father; Hypertension in her mother.  ROS:     Please see the history of present illness.    All other systems reviewed and are negative.  EKGs/Labs/Other Studies Reviewed:    The following studies were reviewed today: Echo 04-30-2017: Study Conclusions  - Left ventricle: The cavity size was normal. Wall thickness was   normal. Systolic function was vigorous. The estimated ejection   fraction was in the range of 65% to 70%. - Aortic valve: AV is thickened, calcified with minimally   restricted motion. Peak and mean gradients through the valve are   20 and 10 mm Hg respectively. There was mild to moderate   regurgitation. - Mitral valve: There was mild regurgitation. - Left atrium: The atrium was mildly dilated.  Carotid US 03-15-2018: Final Interpretation: Right Carotid: Velocities in the right ICA are consistent with a 1-39% stenosis.        Hemodynamically significant plaque >50% visualized in the CCA.        Increased internal carotid artery velocities may be due to        increased velocities in the distal common carotid artery.  Left Carotid: Velocities in the left ICA are consistent with a 1-39% stenosis.       The ECA appears >50% stenosed.  Vertebrals: Bilateral vertebral arteries demonstrate antegrade flow. Subclavians: Normal flow hemodynamics were seen in bilateral subclavian       arteries.  EKG:  EKG is ordered today.  The ekg ordered today demonstrates sinus bradycardia with first-degree AV block, voltage criteria for LVH.  Recent Labs: No results found for requested labs within last 8760 hours.  Recent Lipid Panel No results found for: CHOL, TRIG, HDL, CHOLHDL, VLDL, LDLCALC, LDLDIRECT  Physical Exam:    VS:  BP (!) 108/50   Pulse (!) 55   Ht 5\' 3"  (1.6 m)   Wt 132 lb 12.8 oz (60.2 kg)   SpO2 96%   BMI 23.52 kg/m     Wt Readings from Last 3 Encounters:  05/04/18 132 lb 12.8 oz (60.2 kg)  03/15/18 130 lb (59 kg)  05/06/17 136 lb 6.4 oz (61.9 kg)      GEN: Well nourished, well developed in no acute distress HEENT: Normal NECK: No JVD; bilateral carotid bruits LYMPHATICS: No lymphadenopathy CARDIAC: RRR, 2/6 harsh early peaking crescendo decrescendo murmur at the right upper sternal border, preserved A2 RESPIRATORY:  Clear to auscultation without rales, wheezing or rhonchi  ABDOMEN: Soft, non-tender, non-distended MUSCULOSKELETAL:  No edema; No deformity  SKIN: Warm and dry NEUROLOGIC:  Alert and oriented x 3 PSYCHIATRIC:  Normal affect   ASSESSMENT:  1. Nonrheumatic aortic valve stenosis   2. Bilateral extracranial carotid artery stenosis   3. Atherosclerosis of native coronary artery of native heart without angina pectoris   4. HYPERTENSION, BENIGN    PLAN:    In order of problems listed above:  1. The patient's physical exam is stable and remain suggestive of mild aortic stenosis.  I have recommended a repeat echocardiogram prior to her office visit next year. 2. The patient is followed by vascular surgery.  I have reviewed her most recent carotid duplex scan which is copied above.  She continues on a stable medical regimen.  The patient is on antiplatelet therapy, a statin drug, a beta-blocker, and an ARB. 3. Stable without angina.  I have recommended discontinuation of aspirin.  She should remain on clopidogrel.  We discussed bleeding risk versus ongoing ischemic risk and I think long-term dual antiplatelet therapy in this elderly patient has significant bleeding risk. 4. Blood pressure is well controlled on current medications.  I have reviewed her most recent labs.  No changes are made today.   Medication Adjustments/Labs and Tests Ordered: Current medicines are reviewed at length with the patient today.  Concerns regarding medicines are outlined above.  Orders Placed This Encounter  Procedures  . ECHOCARDIOGRAM COMPLETE   No orders of the defined types were placed in this encounter.   Patient Instructions   Medication Instructions:  1) STOP ASPIRIN    Labwork: None  Testing/Procedures: Your provider has requested that you have an echocardiogram in 1 year. Echocardiography is a painless test that uses sound waves to create images of your heart. It provides your doctor with information about the size and shape of your heart and how well your heart's chambers and valves are working. This procedure takes approximately one hour. There are no restrictions for this procedure.  Follow-Up: You will be called to arrange your echocardiogram and 1 year visit with Dr. Excell Seltzer.  Any Other Special Instructions Will Be Listed Below (If Applicable).     If you need a refill on your cardiac medications before your next appointment, please call your pharmacy.      Signed, Tonny Bollman, MD  05/04/2018 5:37 PM    [facility]

## 2018-05-04 NOTE — Patient Instructions (Addendum)
Medication Instructions:  1) STOP ASPIRIN    Labwork: None  Testing/Procedures: Your provider has requested that you have an echocardiogram in 1 year. Echocardiography is a painless test that uses sound waves to create images of your heart. It provides your doctor with information about the size and shape of your heart and how well your heart's chambers and valves are working. This procedure takes approximately one hour. There are no restrictions for this procedure.  Follow-Up: You will be called to arrange your echocardiogram and 1 year visit with Dr. Excell Seltzerooper.  Any Other Special Instructions Will Be Listed Below (If Applicable).     If you need a refill on your cardiac medications before your next appointment, please call your pharmacy.

## 2018-05-10 DIAGNOSIS — L438 Other lichen planus: Secondary | ICD-10-CM | POA: Diagnosis not present

## 2018-05-10 MED FILL — SHINGRIX 50 MCG SUS: 50 | 1 days supply | Qty: 1 | Fill #1

## 2018-05-12 NOTE — Addendum Note (Signed)
Addended by: Vernard Gambles on: 05/12/2018 07:59 AM   Modules accepted: Orders

## 2018-06-16 ENCOUNTER — Encounter

## 2018-06-24 DIAGNOSIS — L438 Other lichen planus: Secondary | ICD-10-CM | POA: Diagnosis not present

## 2018-06-24 DIAGNOSIS — L218 Other seborrheic dermatitis: Secondary | ICD-10-CM | POA: Diagnosis not present

## 2018-07-06 ENCOUNTER — Ambulatory Visit: Payer: Medicare Other | Admitting: Cardiovascular Disease

## 2018-07-13 DIAGNOSIS — I10 Essential (primary) hypertension: Secondary | ICD-10-CM | POA: Diagnosis not present

## 2018-07-13 DIAGNOSIS — N390 Urinary tract infection, site not specified: Secondary | ICD-10-CM | POA: Diagnosis not present

## 2018-07-20 DIAGNOSIS — R748 Abnormal levels of other serum enzymes: Secondary | ICD-10-CM | POA: Diagnosis not present

## 2018-07-20 DIAGNOSIS — I10 Essential (primary) hypertension: Secondary | ICD-10-CM | POA: Diagnosis not present

## 2018-07-20 DIAGNOSIS — R7303 Prediabetes: Secondary | ICD-10-CM | POA: Diagnosis not present

## 2018-08-04 DIAGNOSIS — L439 Lichen planus, unspecified: Secondary | ICD-10-CM | POA: Diagnosis not present

## 2018-10-04 DIAGNOSIS — L308 Other specified dermatitis: Secondary | ICD-10-CM | POA: Diagnosis not present

## 2018-10-04 DIAGNOSIS — L309 Dermatitis, unspecified: Secondary | ICD-10-CM | POA: Diagnosis not present

## 2018-10-07 DIAGNOSIS — H02833 Dermatochalasis of right eye, unspecified eyelid: Secondary | ICD-10-CM | POA: Diagnosis not present

## 2018-10-07 DIAGNOSIS — H35373 Puckering of macula, bilateral: Secondary | ICD-10-CM | POA: Diagnosis not present

## 2018-10-07 DIAGNOSIS — H40013 Open angle with borderline findings, low risk, bilateral: Secondary | ICD-10-CM | POA: Diagnosis not present

## 2018-10-07 DIAGNOSIS — H02836 Dermatochalasis of left eye, unspecified eyelid: Secondary | ICD-10-CM | POA: Diagnosis not present

## 2018-10-19 DIAGNOSIS — L932 Other local lupus erythematosus: Secondary | ICD-10-CM | POA: Diagnosis not present

## 2019-01-25 DIAGNOSIS — I10 Essential (primary) hypertension: Secondary | ICD-10-CM | POA: Diagnosis not present

## 2019-01-27 DIAGNOSIS — Z79899 Other long term (current) drug therapy: Secondary | ICD-10-CM | POA: Diagnosis not present

## 2019-01-27 DIAGNOSIS — L930 Discoid lupus erythematosus: Secondary | ICD-10-CM | POA: Diagnosis not present

## 2019-01-27 DIAGNOSIS — Z5181 Encounter for therapeutic drug level monitoring: Secondary | ICD-10-CM | POA: Diagnosis not present

## 2019-03-29 DIAGNOSIS — L930 Discoid lupus erythematosus: Secondary | ICD-10-CM | POA: Diagnosis not present

## 2019-06-02 ENCOUNTER — Other Ambulatory Visit: Payer: Self-pay

## 2019-06-02 ENCOUNTER — Ambulatory Visit (HOSPITAL_COMMUNITY): Payer: Medicare Other | Attending: Cardiology

## 2019-06-02 DIAGNOSIS — I350 Nonrheumatic aortic (valve) stenosis: Secondary | ICD-10-CM | POA: Diagnosis not present

## 2019-06-02 DIAGNOSIS — I2510 Atherosclerotic heart disease of native coronary artery without angina pectoris: Secondary | ICD-10-CM | POA: Diagnosis not present

## 2019-06-05 ENCOUNTER — Encounter: Payer: Self-pay | Admitting: Cardiovascular Disease

## 2019-06-05 ENCOUNTER — Other Ambulatory Visit: Payer: Self-pay

## 2019-06-05 ENCOUNTER — Ambulatory Visit: Payer: Medicare Other | Admitting: Cardiovascular Disease

## 2019-06-05 VITALS — BP 130/66 | HR 59 | Ht 63.0 in | Wt 132.8 lb

## 2019-06-05 DIAGNOSIS — I10 Essential (primary) hypertension: Secondary | ICD-10-CM | POA: Diagnosis not present

## 2019-06-05 DIAGNOSIS — I6523 Occlusion and stenosis of bilateral carotid arteries: Secondary | ICD-10-CM | POA: Diagnosis not present

## 2019-06-05 DIAGNOSIS — I2510 Atherosclerotic heart disease of native coronary artery without angina pectoris: Secondary | ICD-10-CM | POA: Diagnosis not present

## 2019-06-05 DIAGNOSIS — I350 Nonrheumatic aortic (valve) stenosis: Secondary | ICD-10-CM | POA: Diagnosis not present

## 2019-06-05 NOTE — Patient Instructions (Signed)

## 2019-06-05 NOTE — Progress Notes (Signed)
Cardiology Office Note:    Date:  06/05/2019   ID:  Jennifer Russo, Jennifer Russo Jan 31, 1941, MRN [id number]  PCP:  Deland Pretty, MD  Cardiologist:  Sherren Mocha, MD  Electrophysiologist:  None   Referring MD: Deland Pretty, MD   Chief Complaint  Patient presents with  . Coronary Artery Disease    History of Present Illness:    Jennifer Russo is a 78 y.o. female with a hx of coronary artery disease, presenting for follow-up evaluation.  The patient underwent stenting of the LAD and right coronary artery in 2009.  She has followed on an annual basis, last seen in September 2019.  Comorbid medical conditions include mild aortic stenosis, hypertension, carotid stenosis with no history of stroke or TIA, and mixed hyperlipidemia.  The patient is here alone today.  She has been doing fairly well.  She has not been going to the gym because of the COVID-19 pandemic.  The patient has been walking outside and she denies any exertional symptoms and specifically denies chest pain, chest pressure, or shortness of breath.  She has had no edema, heart palpitations, orthopnea, or PND.  She continues on her current medical program with no changes.  She reports good blood pressure control at home.  Past Medical History:  Diagnosis Date  . Asymptomatic carotid artery stenosis   . CAD (coronary artery disease)    s/p PCI- LAD and RCA with drug-eluting stents, PTCA in 2009 to treat in stent restenosis in RCA  . Essential hypertension   . HLD (hyperlipidemia)     Past Surgical History:  Procedure Laterality Date  . CORONARY ANGIOPLASTY  03-30-05 / 04-14-05  . CORONARY STENT PLACEMENT    . FEMORAL ARTERY REPAIR Right 07-03-08   Groin area  . KNEE SURGERY  07/2016    Current Medications: Current Meds  Medication Sig  . amLODipine (NORVASC) 10 MG tablet Take 10 mg by mouth daily.  . Ascorbic Acid (VITAMIN C) 1000 MG tablet Take 1,000 mg by mouth daily.    . calcium carbonate (CALCIUM 600) 600 MG TABS Take  600 mg by mouth 2 (two) times daily with a meal.  . Cholecalciferol (VITAMIN D) 2000 UNITS CAPS Take 2,000 Units by mouth daily.  . clopidogrel (PLAVIX) 75 MG tablet Take 75 mg by mouth daily.    . hydrocortisone 2.5 % cream Twice daily to rash  . hydroxychloroquine (PLAQUENIL) 200 MG tablet TAKE 1 TABLET IN THE MORNING AND 1/2 TABLET IN THE EVENING  . levothyroxine (SYNTHROID, LEVOTHROID) 50 MCG tablet Take 50 mcg by mouth daily.  . metoprolol (LOPRESSOR) 50 MG tablet Take 50 mg by mouth 2 (two) times daily.    . Multiple Vitamins-Minerals (MULTIVITAL) tablet Take 1 tablet by mouth daily.    . Omega-3 Fatty Acids (FISH OIL) 1000 MG CAPS Take 1,000 mg by mouth daily.   . rosuvastatin (CRESTOR) 20 MG tablet Take 20 mg by mouth daily.  Marland Kitchen telmisartan-hydrochlorothiazide (MICARDIS HCT) 80-25 MG per tablet Take 1 tablet by mouth daily.     Allergies:   Penicillins, Sulfonamide derivatives, and Tramadol   Social History   Socioeconomic History  . Marital status: Married    Spouse name: Not on file  . Number of children: Not on file  . Years of education: Not on file  . Highest education level: Not on file  Occupational History  . Not on file  Social Needs  . Financial resource strain: Not on file  . Food insecurity  Worry: Not on file    Inability: Not on file  . Transportation needs    Medical: Not on file    Non-medical: Not on file  Tobacco Use  . Smoking status: Never Smoker  . Smokeless tobacco: Never Used  Substance and Sexual Activity  . Alcohol use: No    Alcohol/week: 0.0 standard drinks  . Drug use: No  . Sexual activity: Not on file  Lifestyle  . Physical activity    Days per week: Not on file    Minutes per session: Not on file  . Stress: Not on file  Relationships  . Social Musician on phone: Not on file    Gets together: Not on file    Attends religious service: Not on file    Active member of club or organization: Not on file    Attends  meetings of clubs or organizations: Not on file    Relationship status: Not on file  Other Topics Concern  . Not on file  Social History Narrative  . Not on file     Family History: The patient's family history includes Diabetes in her father; Hypertension in her mother.  ROS:   Please see the history of present illness.    All other systems reviewed and are negative.  EKGs/Labs/Other Studies Reviewed:    The following studies were reviewed today: Echo 06-02-2019: IMPRESSIONS    1. Left ventricular ejection fraction, by visual estimation, is 60 to 65%. The left ventricle has normal function. Normal left ventricular size. Left ventricular septal wall thickness was normal. There is no left ventricular hypertrophy.  2. Global right ventricle has normal systolic function.The right ventricular size is normal. No increase in right ventricular wall thickness.  3. Left atrial size was mildly dilated.  4. Right atrial size was normal.  5. The mitral valve is normal in structure. Trace mitral valve regurgitation. No evidence of mitral stenosis.  6. The tricuspid valve is normal in structure. Tricuspid valve regurgitation is mild.  7. The aortic valve is tricuspid. There is Moderate calcification of the aortic valve. There is Severely thickening of the aortic valve. Aortic valve regurgitation is moderate by color flow Doppler. Mild aortic valve stenosis. Aortic valve mean gradient  measures 11.0 mmHg. Aortic valve peak gradient measures 22.1 mmHg. Aortic valve area, by VTI measures 1.38 cm.  8. The pulmonic valve was normal in structure. Pulmonic valve regurgitation is mild by color flow Doppler.  9. Normal pulmonary artery systolic pressure. 10. The inferior vena cava is normal in size with greater than 50% respiratory variability, suggesting right atrial pressure of 3 mmHg.  EKG:  EKG is ordered today.  The ekg ordered today demonstrates sinus bradycardia 58 bpm, voltage criteria for LVH,  otherwise within normal limits.  Recent Labs: No results found for requested labs within last 8760 hours.  Recent Lipid Panel No results found for: CHOL, TRIG, HDL, CHOLHDL, VLDL, LDLCALC, LDLDIRECT  Physical Exam:    VS:  BP 130/66   Pulse (!) 59   Ht 5\' 3"  (1.6 m)   Wt 132 lb 12.8 oz (60.2 kg)   SpO2 97%   BMI 23.52 kg/m     Wt Readings from Last 3 Encounters:  06/05/19 132 lb 12.8 oz (60.2 kg)  05/04/18 132 lb 12.8 oz (60.2 kg)  03/15/18 130 lb (59 kg)     GEN: Well nourished, well developed elderly woman in no acute distress HEENT: Normal NECK: No JVD;  BL carotid bruits LYMPHATICS: No lymphadenopathy CARDIAC: RRR, 2/6 harsh early peaking systolic murmur at the RUSB, no diastolic murmur RESPIRATORY:  Clear to auscultation without rales, wheezing or rhonchi  ABDOMEN: Soft, non-tender, non-distended MUSCULOSKELETAL:  No edema; No deformity  SKIN: Warm and dry NEUROLOGIC:  Alert and oriented x 3 PSYCHIATRIC:  Normal affect   ASSESSMENT:    1. Nonrheumatic aortic valve stenosis   2. Bilateral extracranial carotid artery stenosis   3. Atherosclerosis of native coronary artery of native heart without angina pectoris   4. HYPERTENSION, BENIGN    PLAN:    In order of problems listed above:  1. Mild AS by exam and recent echo. No symptoms. Reviewed natural hx of AS with the patient. Continue clinical follow-up. Consider repeat echo in 2 years.  2. No stroke/TIA symptoms. Follows with [facility]. On appropriate medical therapy. Due for follow-up carotid US.  She plans to follow-up next year because of the COVID-19 pandemic. 3. Treated with a high-intensity statin drug, clopidogrel, and a beta-blocker 4. BP well-controlled on current Rx.   Medication Adjustments/Labs and Tests Ordered: Current medicines are reviewed at length with the patient today.  Concerns regarding medicines are outlined above.  No orders of the defined types were placed in this encounter.  No orders of  the defined types were placed in this encounter.   There are no Patient Instructions on file for this visit.   Signed, Tonny BollmanMichael Vetta Couzens, MD  06/05/2019 8:29 AM    [facility]

## 2019-06-30 DIAGNOSIS — L930 Discoid lupus erythematosus: Secondary | ICD-10-CM | POA: Diagnosis not present

## 2019-06-30 DIAGNOSIS — Z79899 Other long term (current) drug therapy: Secondary | ICD-10-CM | POA: Diagnosis not present

## 2019-06-30 DIAGNOSIS — Z5181 Encounter for therapeutic drug level monitoring: Secondary | ICD-10-CM | POA: Diagnosis not present

## 2019-06-30 DIAGNOSIS — L931 Subacute cutaneous lupus erythematosus: Secondary | ICD-10-CM | POA: Diagnosis not present

## 2019-07-04 DIAGNOSIS — R748 Abnormal levels of other serum enzymes: Secondary | ICD-10-CM | POA: Diagnosis not present

## 2019-07-04 DIAGNOSIS — R7989 Other specified abnormal findings of blood chemistry: Secondary | ICD-10-CM | POA: Diagnosis not present

## 2019-07-05 DIAGNOSIS — R778 Other specified abnormalities of plasma proteins: Secondary | ICD-10-CM | POA: Diagnosis not present

## 2019-08-14 DIAGNOSIS — E039 Hypothyroidism, unspecified: Secondary | ICD-10-CM | POA: Diagnosis not present

## 2019-08-14 DIAGNOSIS — I10 Essential (primary) hypertension: Secondary | ICD-10-CM | POA: Diagnosis not present

## 2019-08-14 DIAGNOSIS — E782 Mixed hyperlipidemia: Secondary | ICD-10-CM | POA: Diagnosis not present

## 2019-08-14 DIAGNOSIS — Z7689 Persons encountering health services in other specified circumstances: Secondary | ICD-10-CM | POA: Diagnosis not present

## 2019-08-17 DIAGNOSIS — E782 Mixed hyperlipidemia: Secondary | ICD-10-CM | POA: Diagnosis not present

## 2019-08-17 DIAGNOSIS — Z0000 Encounter for general adult medical examination without abnormal findings: Secondary | ICD-10-CM | POA: Diagnosis not present

## 2019-08-17 DIAGNOSIS — N1831 Chronic kidney disease, stage 3a: Secondary | ICD-10-CM | POA: Diagnosis not present

## 2019-09-08 ENCOUNTER — Ambulatory Visit: Payer: Medicare Other | Attending: Internal Medicine

## 2019-09-08 DIAGNOSIS — Z23 Encounter for immunization: Secondary | ICD-10-CM | POA: Insufficient documentation

## 2019-09-08 NOTE — Progress Notes (Signed)
   Covid-19 Vaccination Clinic  Name:  Jennifer Russo    MRN: [id number] DOB: [birth year]  09/08/2019  Jennifer Russo was observed post Covid-19 immunization for 15 minutes without incidence. She was provided with Vaccine Information Sheet and instruction to access the V-Safe system.   Jennifer Russo was instructed to call 911 with any severe reactions post vaccine: Marland Kitchen Difficulty breathing  . Swelling of your face and throat  . A fast heartbeat  . A bad rash all over your body  . Dizziness and weakness    Immunizations Administered    Name Date Dose VIS Date Route   Pfizer COVID-19 Vaccine 09/08/2019 10:21 AM 0.3 mL 07/28/2019 Intramuscular   Manufacturer: [employer], [employer]   Lot: [id number]   NDC: [address]-0

## 2019-09-29 ENCOUNTER — Ambulatory Visit: Payer: Medicare Other | Attending: Internal Medicine

## 2019-09-29 DIAGNOSIS — Z23 Encounter for immunization: Secondary | ICD-10-CM | POA: Insufficient documentation

## 2019-09-29 NOTE — Progress Notes (Signed)
   Covid-19 Vaccination Clinic  Name:  NA WALDRIP    MRN: [id number] DOB: [birth year]  09/29/2019  Ms. Stricker was observed post Covid-19 immunization for 15 minutes without incidence. She was provided with Vaccine Information Sheet and instruction to access the V-Safe system.   Ms. Mcwhirter was instructed to call 911 with any severe reactions post vaccine: Marland Kitchen Difficulty breathing  . Swelling of your face and throat  . A fast heartbeat  . A bad rash all over your body  . Dizziness and weakness    Immunizations Administered    Name Date Dose VIS Date Route   Pfizer COVID-19 Vaccine 09/29/2019 12:45 PM 0.3 mL 07/28/2019 Intramuscular   Manufacturer: [employer], [employer]   Lot: [id number]   NDC: [id number]

## 2019-10-09 DIAGNOSIS — H02833 Dermatochalasis of right eye, unspecified eyelid: Secondary | ICD-10-CM | POA: Diagnosis not present

## 2019-10-09 DIAGNOSIS — H40013 Open angle with borderline findings, low risk, bilateral: Secondary | ICD-10-CM | POA: Diagnosis not present

## 2019-10-09 DIAGNOSIS — H02836 Dermatochalasis of left eye, unspecified eyelid: Secondary | ICD-10-CM | POA: Diagnosis not present

## 2019-10-09 DIAGNOSIS — H35373 Puckering of macula, bilateral: Secondary | ICD-10-CM | POA: Diagnosis not present

## 2019-10-26 DIAGNOSIS — I6523 Occlusion and stenosis of bilateral carotid arteries: Secondary | ICD-10-CM | POA: Diagnosis not present

## 2019-12-29 DIAGNOSIS — L930 Discoid lupus erythematosus: Secondary | ICD-10-CM | POA: Diagnosis not present

## 2019-12-29 DIAGNOSIS — Z79899 Other long term (current) drug therapy: Secondary | ICD-10-CM | POA: Diagnosis not present

## 2019-12-29 DIAGNOSIS — Z5181 Encounter for therapeutic drug level monitoring: Secondary | ICD-10-CM | POA: Diagnosis not present

## 2020-06-21 DIAGNOSIS — S134XXA Sprain of ligaments of cervical spine, initial encounter: Secondary | ICD-10-CM | POA: Diagnosis not present

## 2020-06-21 DIAGNOSIS — Z041 Encounter for examination and observation following transport accident: Secondary | ICD-10-CM | POA: Diagnosis not present

## 2020-06-21 DIAGNOSIS — M542 Cervicalgia: Secondary | ICD-10-CM | POA: Diagnosis not present

## 2020-07-05 DIAGNOSIS — L930 Discoid lupus erythematosus: Secondary | ICD-10-CM | POA: Diagnosis not present

## 2020-07-05 DIAGNOSIS — L932 Other local lupus erythematosus: Secondary | ICD-10-CM | POA: Diagnosis not present

## 2020-08-26 ENCOUNTER — Encounter: Payer: Self-pay | Admitting: Cardiovascular Disease

## 2020-08-26 ENCOUNTER — Ambulatory Visit: Payer: Medicare Other | Admitting: Cardiovascular Disease

## 2020-08-26 ENCOUNTER — Other Ambulatory Visit: Payer: Self-pay

## 2020-08-26 VITALS — BP 140/60 | HR 55 | Ht 63.0 in | Wt 138.2 lb

## 2020-08-26 DIAGNOSIS — I10 Essential (primary) hypertension: Secondary | ICD-10-CM

## 2020-08-26 DIAGNOSIS — I350 Nonrheumatic aortic (valve) stenosis: Secondary | ICD-10-CM

## 2020-08-26 DIAGNOSIS — I2510 Atherosclerotic heart disease of native coronary artery without angina pectoris: Secondary | ICD-10-CM | POA: Diagnosis not present

## 2020-08-26 DIAGNOSIS — I6523 Occlusion and stenosis of bilateral carotid arteries: Secondary | ICD-10-CM

## 2020-08-26 NOTE — Progress Notes (Signed)
Cardiology Office Note:    Date:  08/26/2020   ID:  Jennifer Russo, Jennifer Russo 29-May-1941, MRN [id number]  PCP:  Merri Brunette, MD  [facility] Cardiologist:  Tonny Bollman, MD  [facility] Electrophysiologist:  None   Referring MD: Merri Brunette, MD   Chief Complaint  Patient presents with  . Coronary Artery Disease    History of Present Illness:    Jennifer Russo is a 80 y.o. female with a hx of coronary artery disease, presenting for follow-up evaluation.  The patient has undergone stenting of the LAD and right coronary artery, now greater than 10 years ago.  Comorbid conditions include mild aortic stenosis, hypertension, carotid stenosis with no history of stroke or TIA, and mixed hyperlipidemia.  The patient is doing well with no chest pain or pressure.  She exercises a few days per week at her gym.  She denies orthopnea or PND.  She admits to mild exertional dyspnea which is unchanged over time.  She does have mild leg swelling.  She is compliant with her medicines.  She denies any bleeding problems.  She does admit to some easy bruising but this is unchanged over many years.  No other complaints.  She is scheduled for her annual physical later this month with lab work to be drawn at that time.  Past Medical History:  Diagnosis Date  . Asymptomatic carotid artery stenosis   . CAD (coronary artery disease)    s/p PCI- LAD and RCA with drug-eluting stents, PTCA in 2009 to treat in stent restenosis in RCA  . Essential hypertension   . HLD (hyperlipidemia)     Past Surgical History:  Procedure Laterality Date  . CORONARY ANGIOPLASTY  03-30-05 / 04-14-05  . CORONARY STENT PLACEMENT    . FEMORAL ARTERY REPAIR Right 07-03-08   Groin area  . KNEE SURGERY  07/2016    Current Medications: Current Meds  Medication Sig  . amLODipine (NORVASC) 10 MG tablet Take 10 mg by mouth daily.  . Ascorbic Acid (VITAMIN C) 1000 MG tablet Take 1,000 mg by mouth daily.  . calcium carbonate  (OS-CAL) 600 MG TABS tablet Take 600 mg by mouth 2 (two) times daily with a meal.  . Cholecalciferol (VITAMIN D) 2000 UNITS CAPS Take 2,000 Units by mouth daily.  . clopidogrel (PLAVIX) 75 MG tablet Take 75 mg by mouth daily.  . hydroxychloroquine (PLAQUENIL) 200 MG tablet TAKE 1 TABLET IN THE MORNING AND 1/2 TABLET IN THE EVENING  . levothyroxine (SYNTHROID, LEVOTHROID) 50 MCG tablet Take 50 mcg by mouth daily.  . metoprolol (LOPRESSOR) 50 MG tablet Take 50 mg by mouth 2 (two) times daily.  . Multiple Vitamins-Minerals (MULTIVITAL) tablet Take 1 tablet by mouth daily.  . Omega-3 Fatty Acids (FISH OIL) 1000 MG CAPS Take 1,000 mg by mouth daily.  . rosuvastatin (CRESTOR) 20 MG tablet Take 20 mg by mouth daily.  Marland Kitchen telmisartan-hydrochlorothiazide (MICARDIS HCT) 80-25 MG per tablet Take 1 tablet by mouth daily.     Allergies:   Atorvastatin, Penicillins, Sulfonamide derivatives, and Tramadol   Social History   Socioeconomic History  . Marital status: Married    Spouse name: Not on file  . Number of children: Not on file  . Years of education: Not on file  . Highest education level: Not on file  Occupational History  . Not on file  Tobacco Use  . Smoking status: Never Smoker  . Smokeless tobacco: Never Used  Vaping Use  . Vaping  Use: Never used  Substance and Sexual Activity  . Alcohol use: No    Alcohol/week: 0.0 standard drinks  . Drug use: No  . Sexual activity: Not on file  Other Topics Concern  . Not on file  Social History Narrative  . Not on file   Social Determinants of Health   Financial Resource Strain: Not on file  Food Insecurity: Not on file  Transportation Needs: Not on file  Physical Activity: Not on file  Stress: Not on file  Social Connections: Not on file     Family History: The patient's family history includes Diabetes in her father; Hypertension in her mother.  ROS:   Please see the history of present illness.    All other systems reviewed and are  negative.  EKGs/Labs/Other Studies Reviewed:    The following studies were reviewed today: Vascular ultrasound 03/15/2018: Final Interpretation:  Right Carotid: Velocities in the right ICA are consistent with a 1-39%  stenosis.         Hemodynamically significant plaque >50% visualized in the  CCA.         Increased internal carotid artery velocities may be due to         increased velocities in the distal common carotid artery.   Left Carotid: Velocities in the left ICA are consistent with a 1-39%  stenosis.        The ECA appears >50% stenosed.   Vertebrals: Bilateral vertebral arteries demonstrate antegrade flow.  Subclavians: Normal flow hemodynamics were seen in bilateral subclavian        arteries.   EKG:  EKG is ordered today.  The ekg ordered today demonstrates sinus bradycardia 55 bpm, nonspecific ST abnormality.  Recent Labs: No results found for requested labs within last 8760 hours.  Recent Lipid Panel No results found for: CHOL, TRIG, HDL, CHOLHDL, VLDL, LDLCALC, LDLDIRECT   Risk Assessment/Calculations:       Physical Exam:    VS:  BP 140/60   Pulse (!) 55   Ht 5\' 3"  (1.6 m)   Wt 138 lb 3.2 oz (62.7 kg)   SpO2 96%   BMI 24.48 kg/m     Wt Readings from Last 3 Encounters:  08/26/20 138 lb 3.2 oz (62.7 kg)  06/05/19 132 lb 12.8 oz (60.2 kg)  05/04/18 132 lb 12.8 oz (60.2 kg)     GEN:  Well nourished, well developed in no acute distress HEENT: Normal NECK: No JVD; bilateral carotid bruits LYMPHATICS: No lymphadenopathy CARDIAC: RRR, 2/6 systolic ejection murmur, early peaking, at the right upper sternal border RESPIRATORY:  Clear to auscultation without rales, wheezing or rhonchi  ABDOMEN: Soft, non-tender, non-distended MUSCULOSKELETAL:  No edema; No deformity  SKIN: Warm and dry NEUROLOGIC:  Alert and oriented x 3 PSYCHIATRIC:  Normal affect   ASSESSMENT:    1. Coronary artery disease involving native  coronary artery of native heart without angina pectoris   2. Bilateral extracranial carotid artery stenosis   3. Nonrheumatic aortic valve stenosis   4. HYPERTENSION, BENIGN    PLAN:    In order of problems listed above:  1. The patient is stable with no symptoms of angina.  She remains on clopidogrel for antiplatelet therapy, a beta-blocker, and a high intensity statin drug.  She seems to be doing well with no exertional symptoms or limitation. 2. Reviewed most recent carotid duplex scan which showed moderate common carotid artery stenosis.  Recommend follow-up carotid duplex since her last study was in 2019.  She has no recent stroke or TIA symptoms.  She is on appropriate medical therapy with aspirin and a high intensity statin drug. 3. Exam unchanged, we will see back in 1 year.  Murmur suggestive of only mild aortic stenosis. 4. Blood pressure controlled on amlodipine, metoprolol, and telmisartan/HCTZ.  Medication Adjustments/Labs and Tests Ordered: Current medicines are reviewed at length with the patient today.  Concerns regarding medicines are outlined above.  Orders Placed This Encounter  Procedures  . EKG 12-Lead   No orders of the defined types were placed in this encounter.   Patient Instructions  Medication Instructions:  Your provider recommends that you continue on your current medications as directed. Please refer to the Current Medication list given to you today.   *If you need a refill on your cardiac medications before your next appointment, please call your pharmacy*   Follow-Up: At [facility], you and your health needs are our priority.  As part of our continuing mission to provide you with exceptional heart care, we have created designated Provider Care Teams.  These Care Teams include your primary Cardiologist (physician) and Advanced Practice Providers (APPs -  Physician Assistants and Nurse Practitioners) who all work together to provide you with the care  you need, when you need it. Your next appointment:   12 month(s) The format for your next appointment:   In Person Provider:   You may see Tonny Bollman, MD or one of the following Advanced Practice Providers on your designated Care Team:    Tereso Newcomer, PA-C  [place], [place]      Signed, Tonny Bollman, MD  08/26/2020 4:22 PM    [facility]

## 2020-08-26 NOTE — Patient Instructions (Addendum)
Medication Instructions:  Your provider recommends that you continue on your current medications as directed. Please refer to the Current Medication list given to you today.   *If you need a refill on your cardiac medications before your next appointment, please call your pharmacy*  Testing: Your physician has requested that you have a carotid duplex. This test is an ultrasound of the carotid arteries in your neck. It looks at blood flow through these arteries that supply the brain with blood. Allow one hour for this exam. There are no restrictions or special instructions.  Follow-Up: At [facility], you and your health needs are our priority.  As part of our continuing mission to provide you with exceptional heart care, we have created designated Provider Care Teams.  These Care Teams include your primary Cardiologist (physician) and Advanced Practice Providers (APPs -  Physician Assistants and Nurse Practitioners) who all work together to provide you with the care you need, when you need it. Your next appointment:   12 month(s) The format for your next appointment:   In Person Provider:   You may see Tonny Bollman, MD or one of the following Advanced Practice Providers on your designated Care Team:    Tereso Newcomer, PA-C  [place], [place]

## 2020-09-04 DIAGNOSIS — E039 Hypothyroidism, unspecified: Secondary | ICD-10-CM | POA: Diagnosis not present

## 2020-09-04 DIAGNOSIS — E8809 Other disorders of plasma-protein metabolism, not elsewhere classified: Secondary | ICD-10-CM | POA: Diagnosis not present

## 2020-09-04 DIAGNOSIS — I10 Essential (primary) hypertension: Secondary | ICD-10-CM | POA: Diagnosis not present

## 2020-09-04 DIAGNOSIS — E7800 Pure hypercholesterolemia, unspecified: Secondary | ICD-10-CM | POA: Diagnosis not present

## 2020-09-04 DIAGNOSIS — E559 Vitamin D deficiency, unspecified: Secondary | ICD-10-CM | POA: Diagnosis not present

## 2020-09-04 DIAGNOSIS — E782 Mixed hyperlipidemia: Secondary | ICD-10-CM | POA: Diagnosis not present

## 2020-09-04 DIAGNOSIS — R7309 Other abnormal glucose: Secondary | ICD-10-CM | POA: Diagnosis not present

## 2020-09-09 ENCOUNTER — Other Ambulatory Visit: Payer: Self-pay

## 2020-09-09 ENCOUNTER — Ambulatory Visit (HOSPITAL_COMMUNITY)
Admission: RE | Admit: 2020-09-09 | Discharge: 2020-09-09 | Disposition: A | Discharge status: Home / Self Care | Payer: Medicare Other | Source: Ambulatory Visit | Attending: Cardiology | Admitting: Cardiology

## 2020-09-09 ENCOUNTER — Other Ambulatory Visit: Payer: Self-pay | Admitting: Cardiovascular Disease

## 2020-09-09 DIAGNOSIS — I6523 Occlusion and stenosis of bilateral carotid arteries: Secondary | ICD-10-CM

## 2020-09-11 DIAGNOSIS — Z0000 Encounter for general adult medical examination without abnormal findings: Secondary | ICD-10-CM | POA: Diagnosis not present

## 2020-09-11 DIAGNOSIS — I10 Essential (primary) hypertension: Secondary | ICD-10-CM | POA: Diagnosis not present

## 2020-09-11 DIAGNOSIS — R7303 Prediabetes: Secondary | ICD-10-CM | POA: Diagnosis not present

## 2020-09-11 DIAGNOSIS — I2510 Atherosclerotic heart disease of native coronary artery without angina pectoris: Secondary | ICD-10-CM | POA: Diagnosis not present

## 2020-09-11 DIAGNOSIS — E782 Mixed hyperlipidemia: Secondary | ICD-10-CM | POA: Diagnosis not present

## 2020-10-11 DIAGNOSIS — H02833 Dermatochalasis of right eye, unspecified eyelid: Secondary | ICD-10-CM | POA: Diagnosis not present

## 2020-10-11 DIAGNOSIS — H35373 Puckering of macula, bilateral: Secondary | ICD-10-CM | POA: Diagnosis not present

## 2020-10-11 DIAGNOSIS — H02836 Dermatochalasis of left eye, unspecified eyelid: Secondary | ICD-10-CM | POA: Diagnosis not present

## 2020-10-11 DIAGNOSIS — H40013 Open angle with borderline findings, low risk, bilateral: Secondary | ICD-10-CM | POA: Diagnosis not present

## 2020-11-20 ENCOUNTER — Ambulatory Visit: Payer: Medicare Other | Admitting: Cardiovascular Disease

## 2020-12-05 DIAGNOSIS — M13849 Other specified arthritis, unspecified hand: Secondary | ICD-10-CM | POA: Diagnosis not present

## 2020-12-05 DIAGNOSIS — M65312 Trigger thumb, left thumb: Secondary | ICD-10-CM | POA: Diagnosis not present

## 2020-12-05 DIAGNOSIS — M13842 Other specified arthritis, left hand: Secondary | ICD-10-CM | POA: Diagnosis not present

## 2021-01-02 DIAGNOSIS — M13842 Other specified arthritis, left hand: Secondary | ICD-10-CM | POA: Diagnosis not present

## 2021-01-02 DIAGNOSIS — M65312 Trigger thumb, left thumb: Secondary | ICD-10-CM | POA: Diagnosis not present

## 2021-01-03 DIAGNOSIS — L930 Discoid lupus erythematosus: Secondary | ICD-10-CM | POA: Diagnosis not present

## 2021-01-03 DIAGNOSIS — Z5181 Encounter for therapeutic drug level monitoring: Secondary | ICD-10-CM | POA: Diagnosis not present

## 2021-01-03 DIAGNOSIS — L811 Chloasma: Secondary | ICD-10-CM | POA: Diagnosis not present

## 2021-03-06 DIAGNOSIS — R7309 Other abnormal glucose: Secondary | ICD-10-CM | POA: Diagnosis not present

## 2021-03-06 DIAGNOSIS — E782 Mixed hyperlipidemia: Secondary | ICD-10-CM | POA: Diagnosis not present

## 2021-03-11 DIAGNOSIS — R0781 Pleurodynia: Secondary | ICD-10-CM | POA: Diagnosis not present

## 2021-03-11 DIAGNOSIS — R7309 Other abnormal glucose: Secondary | ICD-10-CM | POA: Diagnosis not present

## 2021-03-11 DIAGNOSIS — E782 Mixed hyperlipidemia: Secondary | ICD-10-CM | POA: Diagnosis not present

## 2021-03-11 DIAGNOSIS — W19XXXA Unspecified fall, initial encounter: Secondary | ICD-10-CM | POA: Diagnosis not present

## 2021-03-11 DIAGNOSIS — B354 Tinea corporis: Secondary | ICD-10-CM | POA: Diagnosis not present

## 2021-03-11 DIAGNOSIS — I700 Atherosclerosis of aorta: Secondary | ICD-10-CM | POA: Diagnosis not present

## 2021-09-09 ENCOUNTER — Other Ambulatory Visit: Payer: Self-pay | Admitting: *Deleted

## 2021-09-09 ENCOUNTER — Other Ambulatory Visit: Payer: Self-pay

## 2021-09-09 ENCOUNTER — Ambulatory Visit (HOSPITAL_COMMUNITY)
Admission: RE | Admit: 2021-09-09 | Discharge: 2021-09-09 | Disposition: A | Discharge status: Home / Self Care | Payer: Medicare Other | Source: Ambulatory Visit | Attending: Cardiology | Admitting: Cardiology

## 2021-09-09 DIAGNOSIS — I6523 Occlusion and stenosis of bilateral carotid arteries: Secondary | ICD-10-CM | POA: Insufficient documentation

## 2021-09-09 NOTE — Progress Notes (Signed)
Carotid doppler to be done in January 2024

## 2021-09-15 DIAGNOSIS — R7309 Other abnormal glucose: Secondary | ICD-10-CM | POA: Diagnosis not present

## 2021-09-15 DIAGNOSIS — I10 Essential (primary) hypertension: Secondary | ICD-10-CM | POA: Diagnosis not present

## 2021-09-15 DIAGNOSIS — E039 Hypothyroidism, unspecified: Secondary | ICD-10-CM | POA: Diagnosis not present

## 2021-09-18 DIAGNOSIS — I351 Nonrheumatic aortic (valve) insufficiency: Secondary | ICD-10-CM | POA: Diagnosis not present

## 2021-09-18 DIAGNOSIS — I2510 Atherosclerotic heart disease of native coronary artery without angina pectoris: Secondary | ICD-10-CM | POA: Diagnosis not present

## 2021-09-18 DIAGNOSIS — Z0000 Encounter for general adult medical examination without abnormal findings: Secondary | ICD-10-CM | POA: Diagnosis not present

## 2021-09-18 DIAGNOSIS — I10 Essential (primary) hypertension: Secondary | ICD-10-CM | POA: Diagnosis not present

## 2021-09-18 DIAGNOSIS — E559 Vitamin D deficiency, unspecified: Secondary | ICD-10-CM | POA: Diagnosis not present

## 2021-09-18 DIAGNOSIS — R7303 Prediabetes: Secondary | ICD-10-CM | POA: Diagnosis not present

## 2021-09-18 DIAGNOSIS — E039 Hypothyroidism, unspecified: Secondary | ICD-10-CM | POA: Diagnosis not present

## 2021-09-18 DIAGNOSIS — I6523 Occlusion and stenosis of bilateral carotid arteries: Secondary | ICD-10-CM | POA: Diagnosis not present

## 2021-09-18 DIAGNOSIS — R600 Localized edema: Secondary | ICD-10-CM | POA: Diagnosis not present

## 2021-09-18 DIAGNOSIS — N1831 Chronic kidney disease, stage 3a: Secondary | ICD-10-CM | POA: Diagnosis not present

## 2021-09-19 ENCOUNTER — Other Ambulatory Visit: Payer: Self-pay

## 2021-09-19 ENCOUNTER — Ambulatory Visit: Payer: Medicare Other | Admitting: Cardiovascular Disease

## 2021-09-19 ENCOUNTER — Encounter: Payer: Self-pay | Admitting: Cardiovascular Disease

## 2021-09-19 VITALS — BP 146/74 | HR 59 | Ht 63.0 in | Wt 142.6 lb

## 2021-09-19 DIAGNOSIS — I10 Essential (primary) hypertension: Secondary | ICD-10-CM | POA: Diagnosis not present

## 2021-09-19 DIAGNOSIS — E782 Mixed hyperlipidemia: Secondary | ICD-10-CM

## 2021-09-19 DIAGNOSIS — I6523 Occlusion and stenosis of bilateral carotid arteries: Secondary | ICD-10-CM

## 2021-09-19 DIAGNOSIS — I2510 Atherosclerotic heart disease of native coronary artery without angina pectoris: Secondary | ICD-10-CM | POA: Diagnosis not present

## 2021-09-19 DIAGNOSIS — I350 Nonrheumatic aortic (valve) stenosis: Secondary | ICD-10-CM

## 2021-09-19 NOTE — Progress Notes (Signed)
Cardiology Office Note:    Date:  09/19/2021   ID:  Chetara, Kropp 1941/06/09, MRN [id number]  PCP:  Merri Brunette, MD   [facility] Providers Cardiologist:  Tonny Bollman, MD     Referring MD: Merri Brunette, MD   Chief Complaint  Patient presents with   Coronary Artery Disease    History of Present Illness:    Jennifer Russo is a 81 y.o. female with a hx of coronary artery disease, presenting for follow-up evaluation.  The patient has undergone stenting of the LAD and right coronary artery, now greater than 10 years ago.  Comorbid conditions include mild aortic stenosis, hypertension, carotid stenosis with no history of stroke or TIA, and mixed hyperlipidemia.  The patient is here with her husband today.  She denies any chest pain or shortness of breath.  She admits to lower extremity swelling.  She is physically active and still goes to the gym a few days per week with no exertional symptoms.  No lightheadedness or heart palpitations.  Past Medical History:  Diagnosis Date   Asymptomatic carotid artery stenosis    CAD (coronary artery disease)    s/p PCI- LAD and RCA with drug-eluting stents, PTCA in 2009 to treat in stent restenosis in RCA   Essential hypertension    HLD (hyperlipidemia)     Past Surgical History:  Procedure Laterality Date   CORONARY ANGIOPLASTY  03-30-05 / 04-14-05   CORONARY STENT PLACEMENT     FEMORAL ARTERY REPAIR Right 07-03-08   Groin area   KNEE SURGERY  07/2016    Current Medications: Current Meds  Medication Sig   amLODipine (NORVASC) 10 MG tablet Take 10 mg by mouth daily.   Ascorbic Acid (VITAMIN C) 1000 MG tablet Take 1,000 mg by mouth daily.   calcium carbonate (OS-CAL) 600 MG TABS tablet Take 600 mg by mouth 2 (two) times daily with a meal.   Cholecalciferol (VITAMIN D) 2000 UNITS CAPS Take 2,000 Units by mouth daily.   clopidogrel (PLAVIX) 75 MG tablet Take 75 mg by mouth daily.   levothyroxine (SYNTHROID, LEVOTHROID) 50 MCG  tablet Take 50 mcg by mouth daily.   metoprolol (LOPRESSOR) 50 MG tablet Take 50 mg by mouth 2 (two) times daily.   Multiple Vitamins-Minerals (MULTIVITAL) tablet Take 1 tablet by mouth daily.   Omega-3 Fatty Acids (FISH OIL) 1000 MG CAPS Take 1,000 mg by mouth daily.   rosuvastatin (CRESTOR) 20 MG tablet Take 20 mg by mouth daily.   telmisartan-hydrochlorothiazide (MICARDIS HCT) 80-25 MG per tablet Take 1 tablet by mouth daily.     Allergies:   Atorvastatin, Penicillins, Sulfonamide derivatives, and Tramadol   Social History   Socioeconomic History   Marital status: Married    Spouse name: Not on file   Number of children: Not on file   Years of education: Not on file   Highest education level: Not on file  Occupational History   Not on file  Tobacco Use   Smoking status: Never   Smokeless tobacco: Never  Vaping Use   Vaping Use: Never used  Substance and Sexual Activity   Alcohol use: No    Alcohol/week: 0.0 standard drinks   Drug use: No   Sexual activity: Not on file  Other Topics Concern   Not on file  Social History Narrative   Not on file   Social Determinants of Health   Financial Resource Strain: Not on file  Food Insecurity: Not on file  Transportation Needs: Not on file  Physical Activity: Not on file  Stress: Not on file  Social Connections: Not on file     Family History: The patient's family history includes Diabetes in her father; Hypertension in her mother.  ROS:   Please see the history of present illness.    All other systems reviewed and are negative.  EKGs/Labs/Other Studies Reviewed:    The following studies were reviewed today: Echo 06-02-2019: 1. Left ventricular ejection fraction, by visual estimation, is 60 to  65%. The left ventricle has normal function. Normal left ventricular size.  Left ventricular septal wall thickness was normal. There is no left  ventricular hypertrophy.   2. Global right ventricle has normal systolic  function.The right  ventricular size is normal. No increase in right ventricular wall  thickness.   3. Left atrial size was mildly dilated.   4. Right atrial size was normal.   5. The mitral valve is normal in structure. Trace mitral valve  regurgitation. No evidence of mitral stenosis.   6. The tricuspid valve is normal in structure. Tricuspid valve  regurgitation is mild.   7. The aortic valve is tricuspid. There is Moderate calcification of the  aortic valve. There is Severely thickening of the aortic valve. Aortic  valve regurgitation is moderate by color flow Doppler. Mild aortic valve  stenosis. Aortic valve mean gradient   measures 11.0 mmHg. Aortic valve peak gradient measures 22.1 mmHg. Aortic  valve area, by VTI measures 1.38 cm.   8. The pulmonic valve was normal in structure. Pulmonic valve  regurgitation is mild by color flow Doppler.   9. Normal pulmonary artery systolic pressure.  10. The inferior vena cava is normal in size with greater than 50%  respiratory variability, suggesting right atrial pressure of 3 mmHg.   Carotid US 09/09/2021: Right Carotid: Velocities in the right ICA are consistent with a 1-39%  stenosis,                 high-end of range. Non-hemodynamically significant plaque  <50%                 noted in the CCA. The ECA appears >50% stenosed.   Left Carotid: Velocities in the left ICA are consistent with a 1-39%  stenosis,                high-end of range. The ECA appears >50% stenosed.   Vertebrals:  Bilateral vertebral arteries demonstrate antegrade flow.  Subclavians: Normal flow hemodynamics were seen in bilateral subclavian               arteries.   EKG:  EKG is ordered today.  The ekg ordered today demonstrates normal sinus rhythm 59 bpm, within normal limits.  Recent Labs: No results found for requested labs within last 8760 hours.  Recent Lipid Panel No results found for: CHOL, TRIG, HDL, CHOLHDL, VLDL, LDLCALC, LDLDIRECT   Risk  Assessment/Calculations:           Physical Exam:    VS:  BP (!) 146/74    Pulse (!) 59    Ht 5\' 3"  (1.6 m)    Wt 142 lb 9.6 oz (64.7 kg)    SpO2 95%    BMI 25.26 kg/m     Wt Readings from Last 3 Encounters:  09/19/21 142 lb 9.6 oz (64.7 kg)  08/26/20 138 lb 3.2 oz (62.7 kg)  06/05/19 132 lb 12.8 oz (60.2 kg)  GEN:  Well nourished, well developed in no acute distress HEENT: Normal NECK: No JVD; bilateral carotid bruits LYMPHATICS: No lymphadenopathy CARDIAC: RRR, 2/6 harsh early peaking systolic crescendo decrescendo murmur at the RUSB RESPIRATORY:  Clear to auscultation without rales, wheezing or rhonchi  ABDOMEN: Soft, non-tender, non-distended MUSCULOSKELETAL:  No edema; No deformity  SKIN: Warm and dry NEUROLOGIC:  Alert and oriented x 3 PSYCHIATRIC:  Normal affect   ASSESSMENT:    1. Coronary artery disease involving native coronary artery of native heart without angina pectoris   2. Nonrheumatic aortic valve stenosis   3. Bilateral extracranial carotid artery stenosis   4. Essential hypertension   5. Mixed hyperlipidemia    PLAN:    In order of problems listed above:  Remains stable with a good functional capacity especially considering her advanced age.  She has no anginal symptoms.  She still exercises a few days per week.  Maintained on clopidogrel, metoprolol, and high intensity statin drug. Patient with mild aortic stenosis.  Her exam remains consistent with this.  Last echocardiogram 2020 reviewed, will repeat prior to her next office visit in 1 year. Recent duplex reviewed with less than 40% bilateral ICA stenosis.  High-grade ECA stenosis noted, patient understands this is not clinically significant.  She remains on clopidogrel and a statin drug. Blood pressure controlled.  Continue amlodipine, telmisartan, and hydrochlorothiazide. Recent labs drawn by her PCP.  I do not have copies but last years cholesterol is reviewed with excellent results showing a  cholesterol of 101, HDL 31, LDL 42.  Continue rosuvastatin.     Medication Adjustments/Labs and Tests Ordered: Current medicines are reviewed at length with the patient today.  Concerns regarding medicines are outlined above.  Orders Placed This Encounter  Procedures   EKG 12-Lead   ECHOCARDIOGRAM COMPLETE   No orders of the defined types were placed in this encounter.   Patient Instructions  Medication Instructions:  Your physician recommends that you continue on your current medications as directed. Please refer to the Current Medication list given to you today.  *If you need a refill on your cardiac medications before your next appointment, please call your pharmacy*   Lab Work: NONE If you have labs (blood work) drawn today and your tests are completely normal, you will receive your results only by: MyChart Message (if you have MyChart) OR A paper copy in the mail If you have any lab test that is abnormal or we need to change your treatment, we will call you to review the results.   Testing/Procedures: ECHO (1 week prior to next year's appointment) Your physician has requested that you have an echocardiogram. Echocardiography is a painless test that uses sound waves to create images of your heart. It provides your doctor with information about the size and shape of your heart and how well your hearts chambers and valves are working. This procedure takes approximately one hour. There are no restrictions for this procedure.    Follow-Up: At [facility], you and your health needs are our priority.  As part of our continuing mission to provide you with exceptional heart care, we have created designated Provider Care Teams.  These Care Teams include your primary Cardiologist (physician) and Advanced Practice Providers (APPs -  Physician Assistants and Nurse Practitioners) who all work together to provide you with the care you need, when you need it.   Your next appointment:    1 year(s)  The format for your next appointment:   In  Person  Provider:   Tonny Bollman, MD    Thank you for allowing myself and [facility] to serve you, God Bless!!    Signed, Tonny Bollman, MD  09/19/2021 5:23 PM    [facility]

## 2021-09-19 NOTE — Patient Instructions (Signed)
Medication Instructions:  Your physician recommends that you continue on your current medications as directed. Please refer to the Current Medication list given to you today.  *If you need a refill on your cardiac medications before your next appointment, please call your pharmacy*   Lab Work: NONE If you have labs (blood work) drawn today and your tests are completely normal, you will receive your results only by: [place] (if you have MyChart) OR A paper copy in the mail If you have any lab test that is abnormal or we need to change your treatment, we will call you to review the results.   Testing/Procedures: ECHO (1 week prior to next year's appointment) Your physician has requested that you have an echocardiogram. Echocardiography is a painless test that uses sound waves to create images of your heart. It provides your doctor with information about the size and shape of your heart and how well your hearts chambers and valves are working. This procedure takes approximately one hour. There are no restrictions for this procedure.    Follow-Up: At [facility], you and your health needs are our priority.  As part of our continuing mission to provide you with exceptional heart care, we have created designated Provider Care Teams.  These Care Teams include your primary Cardiologist (physician) and Advanced Practice Providers (APPs -  Physician Assistants and Nurse Practitioners) who all work together to provide you with the care you need, when you need it.   Your next appointment:   1 year(s)  The format for your next appointment:   In Person  Provider:   Sherren Mocha, MD    Thank you for allowing myself and [place] to serve you, God Bless!!

## 2021-10-17 DIAGNOSIS — H40013 Open angle with borderline findings, low risk, bilateral: Secondary | ICD-10-CM | POA: Diagnosis not present

## 2021-10-17 DIAGNOSIS — H35373 Puckering of macula, bilateral: Secondary | ICD-10-CM | POA: Diagnosis not present

## 2021-10-17 DIAGNOSIS — H02833 Dermatochalasis of right eye, unspecified eyelid: Secondary | ICD-10-CM | POA: Diagnosis not present

## 2021-10-17 DIAGNOSIS — H02836 Dermatochalasis of left eye, unspecified eyelid: Secondary | ICD-10-CM | POA: Diagnosis not present

## 2021-11-26 DIAGNOSIS — H35373 Puckering of macula, bilateral: Secondary | ICD-10-CM | POA: Diagnosis not present

## 2021-11-26 DIAGNOSIS — H43813 Vitreous degeneration, bilateral: Secondary | ICD-10-CM | POA: Diagnosis not present

## 2021-11-26 DIAGNOSIS — H353231 Exudative age-related macular degeneration, bilateral, with active choroidal neovascularization: Secondary | ICD-10-CM | POA: Diagnosis not present

## 2021-12-05 DIAGNOSIS — L930 Discoid lupus erythematosus: Secondary | ICD-10-CM | POA: Diagnosis not present

## 2021-12-09 DIAGNOSIS — H353231 Exudative age-related macular degeneration, bilateral, with active choroidal neovascularization: Secondary | ICD-10-CM | POA: Diagnosis not present

## 2021-12-16 DIAGNOSIS — E782 Mixed hyperlipidemia: Secondary | ICD-10-CM | POA: Diagnosis not present

## 2021-12-16 DIAGNOSIS — R7309 Other abnormal glucose: Secondary | ICD-10-CM | POA: Diagnosis not present

## 2021-12-16 DIAGNOSIS — M8589 Other specified disorders of bone density and structure, multiple sites: Secondary | ICD-10-CM | POA: Diagnosis not present

## 2021-12-16 DIAGNOSIS — M85859 Other specified disorders of bone density and structure, unspecified thigh: Secondary | ICD-10-CM | POA: Diagnosis not present

## 2021-12-16 DIAGNOSIS — E559 Vitamin D deficiency, unspecified: Secondary | ICD-10-CM | POA: Diagnosis not present

## 2021-12-16 DIAGNOSIS — I10 Essential (primary) hypertension: Secondary | ICD-10-CM | POA: Diagnosis not present

## 2021-12-16 DIAGNOSIS — I2510 Atherosclerotic heart disease of native coronary artery without angina pectoris: Secondary | ICD-10-CM | POA: Diagnosis not present

## 2022-01-06 DIAGNOSIS — H353231 Exudative age-related macular degeneration, bilateral, with active choroidal neovascularization: Secondary | ICD-10-CM | POA: Diagnosis not present

## 2022-01-06 DIAGNOSIS — H43813 Vitreous degeneration, bilateral: Secondary | ICD-10-CM | POA: Diagnosis not present

## 2022-01-06 DIAGNOSIS — H35373 Puckering of macula, bilateral: Secondary | ICD-10-CM | POA: Diagnosis not present

## 2022-01-13 DIAGNOSIS — I10 Essential (primary) hypertension: Secondary | ICD-10-CM | POA: Diagnosis not present

## 2022-01-21 DIAGNOSIS — I10 Essential (primary) hypertension: Secondary | ICD-10-CM | POA: Diagnosis not present

## 2022-02-06 DIAGNOSIS — H43813 Vitreous degeneration, bilateral: Secondary | ICD-10-CM | POA: Diagnosis not present

## 2022-02-06 DIAGNOSIS — H35373 Puckering of macula, bilateral: Secondary | ICD-10-CM | POA: Diagnosis not present

## 2022-02-06 DIAGNOSIS — H353231 Exudative age-related macular degeneration, bilateral, with active choroidal neovascularization: Secondary | ICD-10-CM | POA: Diagnosis not present

## 2022-03-06 DIAGNOSIS — H35373 Puckering of macula, bilateral: Secondary | ICD-10-CM | POA: Diagnosis not present

## 2022-03-06 DIAGNOSIS — H353231 Exudative age-related macular degeneration, bilateral, with active choroidal neovascularization: Secondary | ICD-10-CM | POA: Diagnosis not present

## 2022-03-06 DIAGNOSIS — H43813 Vitreous degeneration, bilateral: Secondary | ICD-10-CM | POA: Diagnosis not present

## 2022-03-24 DIAGNOSIS — I10 Essential (primary) hypertension: Secondary | ICD-10-CM | POA: Diagnosis not present

## 2022-04-02 DIAGNOSIS — Z8673 Personal history of transient ischemic attack (TIA), and cerebral infarction without residual deficits: Secondary | ICD-10-CM | POA: Diagnosis not present

## 2022-04-02 DIAGNOSIS — E782 Mixed hyperlipidemia: Secondary | ICD-10-CM | POA: Diagnosis not present

## 2022-04-02 DIAGNOSIS — E871 Hypo-osmolality and hyponatremia: Secondary | ICD-10-CM | POA: Diagnosis not present

## 2022-04-02 DIAGNOSIS — R7303 Prediabetes: Secondary | ICD-10-CM | POA: Diagnosis not present

## 2022-04-02 DIAGNOSIS — Z7902 Long term (current) use of antithrombotics/antiplatelets: Secondary | ICD-10-CM | POA: Diagnosis not present

## 2022-04-02 DIAGNOSIS — I2510 Atherosclerotic heart disease of native coronary artery without angina pectoris: Secondary | ICD-10-CM | POA: Diagnosis not present

## 2022-04-02 DIAGNOSIS — I10 Essential (primary) hypertension: Secondary | ICD-10-CM | POA: Diagnosis not present

## 2022-04-17 DIAGNOSIS — H35373 Puckering of macula, bilateral: Secondary | ICD-10-CM | POA: Diagnosis not present

## 2022-04-17 DIAGNOSIS — H353231 Exudative age-related macular degeneration, bilateral, with active choroidal neovascularization: Secondary | ICD-10-CM | POA: Diagnosis not present

## 2022-04-17 DIAGNOSIS — H43812 Vitreous degeneration, left eye: Secondary | ICD-10-CM | POA: Diagnosis not present

## 2022-06-16 DIAGNOSIS — H353231 Exudative age-related macular degeneration, bilateral, with active choroidal neovascularization: Secondary | ICD-10-CM | POA: Diagnosis not present

## 2022-06-16 DIAGNOSIS — H43812 Vitreous degeneration, left eye: Secondary | ICD-10-CM | POA: Diagnosis not present

## 2022-06-16 DIAGNOSIS — H35373 Puckering of macula, bilateral: Secondary | ICD-10-CM | POA: Diagnosis not present

## 2022-07-06 DIAGNOSIS — E782 Mixed hyperlipidemia: Secondary | ICD-10-CM | POA: Diagnosis not present

## 2022-07-06 DIAGNOSIS — R7303 Prediabetes: Secondary | ICD-10-CM | POA: Diagnosis not present

## 2022-07-06 DIAGNOSIS — Z7902 Long term (current) use of antithrombotics/antiplatelets: Secondary | ICD-10-CM | POA: Diagnosis not present

## 2022-07-06 DIAGNOSIS — I10 Essential (primary) hypertension: Secondary | ICD-10-CM | POA: Diagnosis not present

## 2022-07-29 DIAGNOSIS — I2510 Atherosclerotic heart disease of native coronary artery without angina pectoris: Secondary | ICD-10-CM | POA: Diagnosis not present

## 2022-07-29 DIAGNOSIS — E871 Hypo-osmolality and hyponatremia: Secondary | ICD-10-CM | POA: Diagnosis not present

## 2022-07-29 DIAGNOSIS — R7303 Prediabetes: Secondary | ICD-10-CM | POA: Diagnosis not present

## 2022-07-29 DIAGNOSIS — Z7902 Long term (current) use of antithrombotics/antiplatelets: Secondary | ICD-10-CM | POA: Diagnosis not present

## 2022-07-29 DIAGNOSIS — Z8673 Personal history of transient ischemic attack (TIA), and cerebral infarction without residual deficits: Secondary | ICD-10-CM | POA: Diagnosis not present

## 2022-07-29 DIAGNOSIS — I10 Essential (primary) hypertension: Secondary | ICD-10-CM | POA: Diagnosis not present

## 2022-07-29 DIAGNOSIS — E782 Mixed hyperlipidemia: Secondary | ICD-10-CM | POA: Diagnosis not present

## 2022-07-31 DIAGNOSIS — M25562 Pain in left knee: Secondary | ICD-10-CM | POA: Diagnosis not present

## 2022-08-18 DIAGNOSIS — R7303 Prediabetes: Secondary | ICD-10-CM | POA: Diagnosis not present

## 2022-08-18 DIAGNOSIS — E871 Hypo-osmolality and hyponatremia: Secondary | ICD-10-CM | POA: Diagnosis not present

## 2022-08-18 DIAGNOSIS — I10 Essential (primary) hypertension: Secondary | ICD-10-CM | POA: Diagnosis not present

## 2022-08-18 DIAGNOSIS — I2510 Atherosclerotic heart disease of native coronary artery without angina pectoris: Secondary | ICD-10-CM | POA: Diagnosis not present

## 2022-08-18 DIAGNOSIS — E782 Mixed hyperlipidemia: Secondary | ICD-10-CM | POA: Diagnosis not present

## 2022-08-18 DIAGNOSIS — Z8673 Personal history of transient ischemic attack (TIA), and cerebral infarction without residual deficits: Secondary | ICD-10-CM | POA: Diagnosis not present

## 2022-08-18 DIAGNOSIS — Z7902 Long term (current) use of antithrombotics/antiplatelets: Secondary | ICD-10-CM | POA: Diagnosis not present

## 2022-08-24 DIAGNOSIS — M25562 Pain in left knee: Secondary | ICD-10-CM | POA: Diagnosis not present

## 2022-08-26 DIAGNOSIS — H43812 Vitreous degeneration, left eye: Secondary | ICD-10-CM | POA: Diagnosis not present

## 2022-08-26 DIAGNOSIS — H35373 Puckering of macula, bilateral: Secondary | ICD-10-CM | POA: Diagnosis not present

## 2022-08-26 DIAGNOSIS — H353231 Exudative age-related macular degeneration, bilateral, with active choroidal neovascularization: Secondary | ICD-10-CM | POA: Diagnosis not present

## 2022-09-04 DIAGNOSIS — S83242A Other tear of medial meniscus, current injury, left knee, initial encounter: Secondary | ICD-10-CM | POA: Diagnosis not present

## 2022-09-09 ENCOUNTER — Ambulatory Visit (HOSPITAL_COMMUNITY)
Admission: RE | Admit: 2022-09-09 | Discharge: 2022-09-09 | Disposition: A | Discharge status: Home / Self Care | Payer: Medicare Other | Source: Ambulatory Visit | Attending: Cardiovascular Disease | Admitting: Cardiovascular Disease

## 2022-09-09 DIAGNOSIS — I6523 Occlusion and stenosis of bilateral carotid arteries: Secondary | ICD-10-CM | POA: Insufficient documentation

## 2022-09-18 ENCOUNTER — Ambulatory Visit (HOSPITAL_COMMUNITY): Payer: Medicare Other | Attending: Cardiology

## 2022-09-18 DIAGNOSIS — E782 Mixed hyperlipidemia: Secondary | ICD-10-CM | POA: Insufficient documentation

## 2022-09-18 DIAGNOSIS — I10 Essential (primary) hypertension: Secondary | ICD-10-CM

## 2022-09-18 DIAGNOSIS — I2510 Atherosclerotic heart disease of native coronary artery without angina pectoris: Secondary | ICD-10-CM | POA: Insufficient documentation

## 2022-09-18 DIAGNOSIS — I350 Nonrheumatic aortic (valve) stenosis: Secondary | ICD-10-CM | POA: Diagnosis not present

## 2022-09-18 DIAGNOSIS — I6523 Occlusion and stenosis of bilateral carotid arteries: Secondary | ICD-10-CM | POA: Insufficient documentation

## 2022-09-18 LAB — ECHOCARDIOGRAM COMPLETE
AR max vel: 1.42 cm2
AV Area VTI: 1.38 cm2
AV Area mean vel: 1.31 cm2
AV Mean grad: 10.8 mmHg
AV Peak grad: 19.2 mmHg
Ao pk vel: 2.19 m/s
Area-P 1/2: 4.54 cm2
P 1/2 time: 340 msec
S' Lateral: 2.6 cm

## 2022-09-22 ENCOUNTER — Ambulatory Visit: Payer: Medicare Other | Admitting: Physician Assistant

## 2022-10-13 DIAGNOSIS — R7303 Prediabetes: Secondary | ICD-10-CM | POA: Diagnosis not present

## 2022-10-13 DIAGNOSIS — I6523 Occlusion and stenosis of bilateral carotid arteries: Secondary | ICD-10-CM | POA: Diagnosis not present

## 2022-10-13 DIAGNOSIS — I2510 Atherosclerotic heart disease of native coronary artery without angina pectoris: Secondary | ICD-10-CM | POA: Diagnosis not present

## 2022-10-13 DIAGNOSIS — Z8673 Personal history of transient ischemic attack (TIA), and cerebral infarction without residual deficits: Secondary | ICD-10-CM | POA: Diagnosis not present

## 2022-10-13 DIAGNOSIS — Z7902 Long term (current) use of antithrombotics/antiplatelets: Secondary | ICD-10-CM | POA: Diagnosis not present

## 2022-10-13 DIAGNOSIS — Z955 Presence of coronary angioplasty implant and graft: Secondary | ICD-10-CM | POA: Diagnosis not present

## 2022-10-13 DIAGNOSIS — Z01818 Encounter for other preprocedural examination: Secondary | ICD-10-CM | POA: Diagnosis not present

## 2022-10-15 DIAGNOSIS — R7309 Other abnormal glucose: Secondary | ICD-10-CM | POA: Diagnosis not present

## 2022-10-15 DIAGNOSIS — I10 Essential (primary) hypertension: Secondary | ICD-10-CM | POA: Diagnosis not present

## 2022-10-15 DIAGNOSIS — E039 Hypothyroidism, unspecified: Secondary | ICD-10-CM | POA: Diagnosis not present

## 2022-10-15 LAB — LAB REPORT - SCANNED: A1c: 6.2

## 2022-10-19 DIAGNOSIS — H02833 Dermatochalasis of right eye, unspecified eyelid: Secondary | ICD-10-CM | POA: Diagnosis not present

## 2022-10-19 DIAGNOSIS — H02836 Dermatochalasis of left eye, unspecified eyelid: Secondary | ICD-10-CM | POA: Diagnosis not present

## 2022-10-19 DIAGNOSIS — H40013 Open angle with borderline findings, low risk, bilateral: Secondary | ICD-10-CM | POA: Diagnosis not present

## 2022-10-19 DIAGNOSIS — H35373 Puckering of macula, bilateral: Secondary | ICD-10-CM | POA: Diagnosis not present

## 2022-10-21 ENCOUNTER — Telehealth: Payer: Self-pay | Admitting: *Deleted

## 2022-10-21 NOTE — Telephone Encounter (Signed)
S/w the pt and informed her that she is going to need a sooner appt for pre op clearance. Pt thanked me for the help. Pt has been added to DOD schedule 10/26/22, as no other appts available. I will update all parties involved.

## 2022-10-21 NOTE — Telephone Encounter (Signed)
   Pre-operative Risk Assessment    Patient Name: Jennifer Russo  DOB: [birth year] MRN: [id number]      Request for Surgical Clearance    Procedure:   LEFT KNEE ARTHROSCOPY  Date of Surgery:  Clearance 11/03/22                                 Surgeon:  DR. Gaynelle Arabian Surgeon's Group or Practice Name:  [facility] Phone number:  [phone] ATTN: [place] Fax number:  [phone]   Type of Clearance Requested:   - Medical  - Pharmacy:  Hold Clopidogrel (Plavix)     Type of Anesthesia:   CHOICE   Additional requests/questions:    [name]   10/21/2022, 11:03 AM

## 2022-10-21 NOTE — Telephone Encounter (Signed)
    [place], MD  Chart reviewed as part of pre-operative protocol coverage. Because of Jennifer Russo's past medical history and time since last visit, he/she will require a follow-up office visit in order to better assess preoperative cardiovascular risk.  Pre-op covering staff: - Please schedule appointment and call patient to inform them. - Please contact requesting surgeon's office via preferred method (i.e, phone, fax) to inform them of need for appointment prior to surgery.  If applicable, this message will also be routed to pharmacy pool and/or primary cardiologist for input on holding anticoagulant/antiplatelet agent as requested below so that this information is available at time of patient's appointment.   Deberah Pelton, NP  10/21/2022, 12:57 PM

## 2022-10-22 DIAGNOSIS — I2510 Atherosclerotic heart disease of native coronary artery without angina pectoris: Secondary | ICD-10-CM | POA: Diagnosis not present

## 2022-10-22 DIAGNOSIS — M85859 Other specified disorders of bone density and structure, unspecified thigh: Secondary | ICD-10-CM | POA: Diagnosis not present

## 2022-10-22 DIAGNOSIS — L659 Nonscarring hair loss, unspecified: Secondary | ICD-10-CM | POA: Diagnosis not present

## 2022-10-22 DIAGNOSIS — Z0000 Encounter for general adult medical examination without abnormal findings: Secondary | ICD-10-CM | POA: Diagnosis not present

## 2022-10-22 DIAGNOSIS — N1831 Chronic kidney disease, stage 3a: Secondary | ICD-10-CM | POA: Diagnosis not present

## 2022-10-22 DIAGNOSIS — E039 Hypothyroidism, unspecified: Secondary | ICD-10-CM | POA: Diagnosis not present

## 2022-10-22 DIAGNOSIS — I10 Essential (primary) hypertension: Secondary | ICD-10-CM | POA: Diagnosis not present

## 2022-10-22 DIAGNOSIS — E782 Mixed hyperlipidemia: Secondary | ICD-10-CM | POA: Diagnosis not present

## 2022-10-22 DIAGNOSIS — R1111 Vomiting without nausea: Secondary | ICD-10-CM | POA: Diagnosis not present

## 2022-10-25 ENCOUNTER — Encounter: Payer: Self-pay | Admitting: Cardiovascular Disease

## 2022-10-25 NOTE — Progress Notes (Signed)
Cardiology Office Note:    Date:  10/26/2022   ID:  Zissel, Chernin Jan 24, 1941, MRN [id number]  PCP:  Deland Pretty, [place] Prov[facility] Cardiologist:  Sherren Mocha, MD {  Referring MD: Deland Pretty, MD   Chief Complaint  Patient presents with   Hyperlipidemia   Coronary Artery Disease   Pre-op Exam    History of Present Illness:  October 26, 2022    Jennifer Russo is a 82 y.o. female with a hx of CAD , carotid artery disease, HTN  We are seeing her for pre op evaluation prior to left knee arthroscopy She is on Plavix for stenting of the LAD > 10 years ago  She has mild AS, hyperlipidemia, mild carotid artery disease with no hx of stroke or TIA   No CP, no dyspnea   Echo in Feb. 2, 2024 shows normal LV systolic function.  Grade I DD Calcified AV , moderate AI , mild AS    No CP , no dyspnea       Past Medical History:  Diagnosis Date   Asymptomatic carotid artery stenosis    CAD (coronary artery disease)    s/p PCI- LAD and RCA with drug-eluting stents, PTCA in 2009 to treat in stent restenosis in RCA   Essential hypertension    HLD (hyperlipidemia)     Past Surgical History:  Procedure Laterality Date   CORONARY ANGIOPLASTY  03-30-05 / 04-14-05   CORONARY STENT PLACEMENT     FEMORAL ARTERY REPAIR Right 07-03-08   Groin area   KNEE SURGERY  07/2016    Current Medications: Current Meds  Medication Sig   amLODipine (NORVASC) 10 MG tablet Take 10 mg by mouth daily.   Ascorbic Acid (VITAMIN C) 1000 MG tablet Take 1,000 mg by mouth daily.   calcium carbonate (OS-CAL) 600 MG TABS tablet Take 600 mg by mouth 2 (two) times daily with a meal.   Cholecalciferol (VITAMIN D) 2000 UNITS CAPS Take 2,000 Units by mouth daily.   clopidogrel (PLAVIX) 75 MG tablet Take 75 mg by mouth daily.   levothyroxine (SYNTHROID, LEVOTHROID) 50 MCG tablet Take 50 mcg by mouth daily.   metoprolol (LOPRESSOR) 50 MG tablet Take 50 mg by mouth 2 (two) times daily.    Multiple Vitamins-Minerals (MULTIVITAL) tablet Take 1 tablet by mouth daily.   Omega-3 Fatty Acids (FISH OIL) 1000 MG CAPS Take 1,000 mg by mouth daily.   rosuvastatin (CRESTOR) 20 MG tablet Take 20 mg by mouth daily.   telmisartan-hydrochlorothiazide (MICARDIS HCT) 80-25 MG per tablet Take 1 tablet by mouth daily.     Allergies:   Atorvastatin, Penicillins, Sulfonamide derivatives, and Tramadol   Social History   Socioeconomic History   Marital status: Married    Spouse name: Not on file   Number of children: Not on file   Years of education: Not on file   Highest education level: Not on file  Occupational History   Not on file  Tobacco Use   Smoking status: Never   Smokeless tobacco: Never  Vaping Use   Vaping Use: Never used  Substance and Sexual Activity   Alcohol use: No    Alcohol/week: 0.0 standard drinks of alcohol   Drug use: No   Sexual activity: Not on file  Other Topics Concern   Not on file  Social History Narrative   Not on file   Social Determinants of Health   Financial Resource Strain: Not on file  Food Insecurity: Not on file  Transportation Needs: Not on file  Physical Activity: Not on file  Stress: Not on file  Social Connections: Not on file     Family History: The patient's family history includes Diabetes in her father; Hypertension in her mother.  ROS:   Please see the history of present illness.     All other systems reviewed and are negative.  EKGs/Labs/Other Studies Reviewed:    The following studies were reviewed today:   EKG:  10/13/22 :   Sinus brady at 50 .  Voltage criteria for LVH   Recent Labs: No results found for requested labs within last 365 days.  Recent Lipid Panel No results found for: "CHOL", "TRIG", "HDL", "CHOLHDL", "VLDL", "LDLCALC", "LDLDIRECT"   Risk Assessment/Calculations:                Physical Exam:    VS:  BP 132/62   Pulse (!) 57   Ht '5\' 3"'$  (1.6 m)   Wt 135 lb (61.2 kg)   SpO2 97%    BMI 23.91 kg/m     Wt Readings from Last 3 Encounters:  10/26/22 135 lb (61.2 kg)  09/19/21 142 lb 9.6 oz (64.7 kg)  08/26/20 138 lb 3.2 oz (62.7 kg)     GEN:  Well nourished, well developed in no acute distress HEENT: Normal NECK: No JVD; radiation of her systolic murmur into her left carotid  LYMPHATICS: No lymphadenopathy CARDIAC:   2/6 systolic murmur at LSB , radiates to her RSB and into carotids  RESPIRATORY:  Clear to auscultation without rales, wheezing or rhonchi  ABDOMEN: Soft, non-tender, non-distended MUSCULOSKELETAL:  No edema; No deformity  SKIN: Warm and dry NEUROLOGIC:  Alert and oriented x 3 PSYCHIATRIC:  Normal affect   ASSESSMENT:    1. Coronary artery disease involving native coronary artery of native heart without angina pectoris   2. Nonrheumatic aortic valve stenosis    PLAN:    In order of problems listed above:  Pre op evaluation prior to knee surgery :    Jennifer Russo is doing very well from a cardiac standpoint.  She has a history of coronary artery disease with stenting greater than 10 years ago.  She will be at low risk to stop her Plavix for 5 to 7 days prior to surgery.   She also has moderate aortic insufficiency and mild aortic stenosis.  She is tolerating this very well.  Overall she is at low risk for her upcoming knee surgery.  Will have her follow-up with Dr. Burt Knack in 6 months.  She can cancel her upcoming appointment with Dr. Burt Knack in April.  2.  Aortic insufficiency / aortic stenosis:   stable .  Follow up with Dr. Burt Knack   3. CAD :  s/p stenting > 10 years ago .  No angina ,  stable .   4.  Hyperlipidemia: Recent labs reveal an LDL of 78.  Triglyceride levels 134.  HDL is 30.  Total cholesterol is 135.  Follow-up with Dr. Burt Knack             Medication Adjustments/Labs and Tests Ordered: Current medicines are reviewed at length with the patient today.  Concerns regarding medicines are outlined above.  No orders of the defined  types were placed in this encounter.  No orders of the defined types were placed in this encounter.   Patient Instructions  Medication Instructions:  Your physician recommends that you continue on your current medications  as directed. Please refer to the Current Medication list given to you today.  *If you need a refill on your cardiac medications before your next appointment, please call your pharmacy*   Lab Work: NONE If you have labs (blood work) drawn today and your tests are completely normal, you will receive your results only by: [place] (if you have MyChart) OR A paper copy in the mail If you have any lab test that is abnormal or we need to change your treatment, we will call you to review the results.   Testing/Procedures: NONE-**Cleared for upcoming procedure**   Follow-Up: At [facility], you and your health needs are our priority.  As part of our continuing mission to provide you with exceptional heart care, we have created designated Provider Care Teams.  These Care Teams include your primary Cardiologist (physician) and Advanced Practice Providers (APPs -  Physician Assistants and Nurse Practitioners) who all work together to provide you with the care you need, when you need it.  We recommend signing up for the patient portal called "MyChart".  Sign up information is provided on this After Visit Summary.  MyChart is used to connect with patients for Virtual Visits (Telemedicine).  Patients are able to view lab/test results, encounter notes, upcoming appointments, etc.  Non-urgent messages can be sent to your provider as well.   To learn more about what you can do with MyChart, go to [email or web address].    Your next appointment:   6 months  Provider:   Sherren Mocha, MD        Signed, Mertie Moores, MD  10/26/2022 9:57 AM    [place]

## 2022-10-26 ENCOUNTER — Encounter: Payer: Self-pay | Admitting: Cardiovascular Disease

## 2022-10-26 ENCOUNTER — Ambulatory Visit: Payer: Medicare Other | Attending: Cardiovascular Disease | Admitting: Cardiovascular Disease

## 2022-10-26 VITALS — BP 132/62 | HR 57 | Ht 63.0 in | Wt 135.0 lb

## 2022-10-26 DIAGNOSIS — I350 Nonrheumatic aortic (valve) stenosis: Secondary | ICD-10-CM | POA: Diagnosis not present

## 2022-10-26 DIAGNOSIS — I2510 Atherosclerotic heart disease of native coronary artery without angina pectoris: Secondary | ICD-10-CM | POA: Diagnosis not present

## 2022-10-26 NOTE — Patient Instructions (Addendum)
Medication Instructions:  Your physician recommends that you continue on your current medications as directed. Please refer to the Current Medication list given to you today.  *If you need a refill on your cardiac medications before your next appointment, please call your pharmacy*   Lab Work: NONE If you have labs (blood work) drawn today and your tests are completely normal, you will receive your results only by: [place] (if you have MyChart) OR A paper copy in the mail If you have any lab test that is abnormal or we need to change your treatment, we will call you to review the results.   Testing/Procedures: NONE-**Cleared for upcoming procedure**   Follow-Up: At [facility], you and your health needs are our priority.  As part of our continuing mission to provide you with exceptional heart care, we have created designated Provider Care Teams.  These Care Teams include your primary Cardiologist (physician) and Advanced Practice Providers (APPs -  Physician Assistants and Nurse Practitioners) who all work together to provide you with the care you need, when you need it.  We recommend signing up for the patient portal called "MyChart".  Sign up information is provided on this After Visit Summary.  MyChart is used to connect with patients for Virtual Visits (Telemedicine).  Patients are able to view lab/test results, encounter notes, upcoming appointments, etc.  Non-urgent messages can be sent to your provider as well.   To learn more about what you can do with MyChart, go to [email or web address].    Your next appointment:   6 months  Provider:   Sherren Mocha, MD

## 2022-11-04 NOTE — Patient Instructions (Addendum)
SURGICAL WAITING ROOM VISITATION Patients having surgery or a procedure may have no more than 2 support people in the waiting area - these visitors may rotate.    If the patient needs to stay at the hospital during part of their recovery, the visitor guidelines for inpatient rooms apply. Pre-op nurse will coordinate an appropriate time for 1 support person to accompany patient in pre-op.  This support person may not rotate.    Please refer to the [facility] website for the visitor guidelines for Inpatients (after your surgery is over and you are in a regular room).   Due to an increase in RSV and influenza rates and associated hospitalizations, children ages 33 and under may not visit patients in [place].     Your procedure is scheduled on: 11-09-22   Report to [facility] Main Entrance    Report to admitting at 2:20 PM   Call this number if you have problems the morning of surgery [phone]   Do not eat food :After Midnight.   After Midnight you may have the following liquids until 1:30 PM DAY OF SURGERY  Water Non-Citrus Juices (without pulp, NO RED) Carbonated Beverages Black Coffee (NO MILK/CREAM OR CREAMERS, sugar ok)  Clear Tea (NO MILK/CREAM OR CREAMERS, sugar ok) regular and decaf                             Plain Jell-O (NO RED)                                           Fruit ices (not with fruit pulp, NO RED)                                     Popsicles (NO RED)                                                               Sports drinks like Gatorade (NO RED)                If you have questions, please contact your surgeon's office.   FOLLOW  ANY ADDITIONAL PRE OP INSTRUCTIONS YOU RECEIVED FROM YOUR SURGEON'S OFFICE!!!     Oral Hygiene is also important to reduce your risk of infection.                                    Remember - BRUSH YOUR TEETH THE MORNING OF SURGERY WITH YOUR REGULAR TOOTHPASTE   Do NOT smoke after Midnight   Take  these medicines the morning of surgery with A SIP OF WATER:   Amlodipine  Levothyroxine  Metoprolol  Rosuvastatin  DO NOT TAKE ANY ORAL DIABETIC MEDICATIONS DAY OF YOUR SURGERY  Bring CPAP mask and tubing day of surgery.                              You may not have  any metal on your body including hair pins, jewelry, and body piercing             Do not wear make-up, lotions, powders, perfumes/ or deodorant  Do not wear nail polish including gel and S&S, artificial/acrylic nails, or any other type of covering on natural nails including finger and toenails. If you have artificial nails, gel coating, etc. that needs to be removed by a nail salon please have this removed prior to surgery or surgery may need to be canceled/ delayed if the surgeon/ anesthesia feels like they are unable to be safely monitored.   Do not shave  48 hours prior to surgery.    Do not bring valuables to the hospital. [place].   Contacts, dentures or bridgework may not be worn into surgery.  DO NOT [place]. PHARMACY WILL DISPENSE MEDICATIONS LISTED ON YOUR MEDICATION LIST TO YOU DURING YOUR ADMISSION [place]!    Patients discharged on the day of surgery will not be allowed to drive home.  Someone NEEDS to stay with you for the first 24 hours after anesthesia.   Special Instructions: Bring a copy of your healthcare power of attorney and living will documents the day of surgery if you haven't scanned them before.              Please read over the following fact sheets you were given: IF [place]  If you received a COVID test during your pre-op visit  it is requested that you wear a mask when out in public, stay away from anyone that may not be feeling well and notify your surgeon if you develop symptoms. If you test positive for Covid or have been in contact with  anyone that has tested positive in the last 10 days please notify you surgeon.  [place] - Preparing for Surgery Before surgery, you can play an important role.  Because skin is not sterile, your skin needs to be as free of germs as possible.  You can reduce the number of germs on your skin by washing with CHG (chlorahexidine gluconate) soap before surgery.  CHG is an antiseptic cleaner which kills germs and bonds with the skin to continue killing germs even after washing. Please DO NOT use if you have an allergy to CHG or antibacterial soaps.  If your skin becomes reddened/irritated stop using the CHG and inform your nurse when you arrive at Short Stay. Do not shave (including legs and underarms) for at least 48 hours prior to the first CHG shower.  You may shave your face/neck.  Please follow these instructions carefully:  1.  Shower with CHG Soap the night before surgery and the  morning of surgery.  2.  If you choose to wash your hair, wash your hair first as usual with your normal  shampoo.  3.  After you shampoo, rinse your hair and body thoroughly to remove the shampoo.                             4.  Use CHG as you would any other liquid soap.  You can apply chg directly to the skin and wash.  Gently with a scrungie or clean washcloth.  5.  Apply the CHG Soap to your body ONLY FROM THE NECK DOWN.  Do   not use on face/ open                           Wound or open sores. Avoid contact with eyes, ears mouth and   genitals (private parts).                       Wash face,  Genitals (private parts) with your normal soap.             6.  Wash thoroughly, paying special attention to the area where your    surgery  will be performed.  7.  Thoroughly rinse your body with warm water from the neck down.  8.  DO NOT shower/wash with your normal soap after using and rinsing off the CHG Soap.                9.  Pat yourself dry with a clean towel.            10.  Wear clean pajamas.            11.   Place clean sheets on your bed the night of your first shower and do not  sleep with pets. Day of Surgery : Do not apply any lotions/deodorants the morning of surgery.  Please wear clean clothes to the hospital/surgery center.  FAILURE TO FOLLOW THESE INSTRUCTIONS MAY RESULT IN THE CANCELLATION OF YOUR SURGERY  PATIENT SIGNATURE_________________________________  NURSE SIGNATURE__________________________________  ________________________________________________________________________

## 2022-11-04 NOTE — Progress Notes (Addendum)
COVID Vaccine Completed:  Yes  Date of COVID positive in last 90 days:  PCP - Deland Pretty, MD Cardiologist - Sherren Mocha, MD  Cardiac clearance in Epic dated 10-26-22 by Dr. Acie Fredrickson  Chest x-ray -  EKG - 11-05-22 Epic Stress Test - 03-16-11 Epic ECHO - 09-18-22 Epic Cardiac Cath -  Pacemaker/ICD device last checked: Spinal Cord Stimulator:  Bowel Prep -   Sleep Study -  CPAP -   Fasting Blood Sugar -  Checks Blood Sugar _____ times a day  Last dose of GLP1 agonist-  N/A GLP1 instructions:  N/A   Last dose of SGLT-2 inhibitors-  N/A SGLT-2 instructions: N/A   Blood Thinner Instructions:  Plavix. Stop 5 to 7 days prior to surgery per clearance Aspirin Instructions: Last Dose:  Activity level:  Can go up a flight of stairs and perform activities of daily living without stopping and without symptoms of chest pain or shortness of breath.  Able to exercise without symptoms  Unable to go up a flight of stairs without symptoms of     Anesthesia review: CAD, HTN, carotid artery stenosis, mild arotic stenosis  Patient denies shortness of breath, fever, cough and chest pain at PAT appointment  Patient verbalized understanding of instructions that were given to them at the PAT appointment. Patient was also instructed that they will need to review over the PAT instructions again at home before surgery.

## 2022-11-05 ENCOUNTER — Encounter (HOSPITAL_COMMUNITY)
Admission: RE | Admit: 2022-11-05 | Discharge: 2022-11-05 | Disposition: A | Discharge status: Home / Self Care | Payer: Medicare Other | Source: Ambulatory Visit | Attending: Orthopedic Surgery | Admitting: Orthopedic Surgery

## 2022-11-05 ENCOUNTER — Encounter (HOSPITAL_COMMUNITY): Payer: Self-pay

## 2022-11-05 ENCOUNTER — Other Ambulatory Visit: Payer: Self-pay

## 2022-11-05 VITALS — BP 153/59 | HR 64 | Temp 98.3°F | Resp 16 | Ht 63.0 in | Wt 127.8 lb

## 2022-11-05 DIAGNOSIS — Z951 Presence of aortocoronary bypass graft: Secondary | ICD-10-CM | POA: Diagnosis not present

## 2022-11-05 DIAGNOSIS — I129 Hypertensive chronic kidney disease with stage 1 through stage 4 chronic kidney disease, or unspecified chronic kidney disease: Secondary | ICD-10-CM | POA: Diagnosis not present

## 2022-11-05 DIAGNOSIS — S83242A Other tear of medial meniscus, current injury, left knee, initial encounter: Secondary | ICD-10-CM | POA: Insufficient documentation

## 2022-11-05 DIAGNOSIS — Z01818 Encounter for other preprocedural examination: Secondary | ICD-10-CM

## 2022-11-05 DIAGNOSIS — I351 Nonrheumatic aortic (valve) insufficiency: Secondary | ICD-10-CM | POA: Diagnosis not present

## 2022-11-05 DIAGNOSIS — I10 Essential (primary) hypertension: Secondary | ICD-10-CM

## 2022-11-05 DIAGNOSIS — N1831 Chronic kidney disease, stage 3a: Secondary | ICD-10-CM | POA: Insufficient documentation

## 2022-11-05 DIAGNOSIS — X58XXXA Exposure to other specified factors, initial encounter: Secondary | ICD-10-CM | POA: Insufficient documentation

## 2022-11-05 DIAGNOSIS — Z7902 Long term (current) use of antithrombotics/antiplatelets: Secondary | ICD-10-CM | POA: Diagnosis not present

## 2022-11-05 DIAGNOSIS — I2510 Atherosclerotic heart disease of native coronary artery without angina pectoris: Secondary | ICD-10-CM | POA: Insufficient documentation

## 2022-11-05 HISTORY — DX: Hypothyroidism, unspecified: E03.9

## 2022-11-05 HISTORY — DX: Unspecified osteoarthritis, unspecified site: M19.90

## 2022-11-05 LAB — BASIC METABOLIC PANEL
Anion gap: 8 (ref 5–15)
BUN: 28 mg/dL — ABNORMAL HIGH (ref 8–23)
CO2: 23 mmol/L (ref 22–32)
Calcium: 9.8 mg/dL (ref 8.9–10.3)
Chloride: 101 mmol/L (ref 98–111)
Creatinine, Ser: 1.41 mg/dL — ABNORMAL HIGH (ref 0.44–1.00)
GFR, Estimated: 37 mL/min — ABNORMAL LOW (ref >60)
Glucose, Bld: 104 mg/dL — ABNORMAL HIGH (ref 70–99)
Potassium: 4.9 mmol/L (ref 3.5–5.1)
Sodium: 132 mmol/L — ABNORMAL LOW (ref 135–145)

## 2022-11-05 LAB — SURGICAL PCR SCREEN
MRSA, PCR: NEGATIVE
Staphylococcus aureus: NEGATIVE

## 2022-11-06 NOTE — Anesthesia Preprocedure Evaluation (Signed)
Anesthesia Evaluation  Patient identified by MRN, date of birth, ID band Patient awake    Reviewed: Allergy & Precautions, H&P , NPO status , Patient's Chart, lab work & pertinent test results, reviewed documented beta blocker date and time   Airway Mallampati: III  TM Distance: >3 FB Neck ROM: Full    Dental no notable dental hx. (+) Teeth Intact, Dental Advisory Given   Pulmonary neg pulmonary ROS   Pulmonary exam normal breath sounds clear to auscultation       Cardiovascular hypertension, Pt. on medications and Pt. on home beta blockers + CAD, + Cardiac Stents and + Peripheral Vascular Disease   Rhythm:Regular Rate:Normal     Neuro/Psych negative neurological ROS  negative psych ROS   GI/Hepatic negative GI ROS, Neg liver ROS,,,  Endo/Other  Hypothyroidism    Renal/GU negative Renal ROS  negative genitourinary   Musculoskeletal  (+) Arthritis , Osteoarthritis,    Abdominal   Peds  Hematology negative hematology ROS (+)   Anesthesia Other Findings   Reproductive/Obstetrics negative OB ROS                             Anesthesia Physical Anesthesia Plan  ASA: 3  Anesthesia Plan: General   Post-op Pain Management: Ofirmev IV (intra-op)*   Induction: Intravenous  PONV Risk Score and Plan: 4 or greater and Ondansetron, Dexamethasone and Treatment may vary due to age or medical condition  Airway Management Planned: LMA  Additional Equipment:   Intra-op Plan:   Post-operative Plan: Extubation in OR  Informed Consent: I have reviewed the patients History and Physical, chart, labs and discussed the procedure including the risks, benefits and alternatives for the proposed anesthesia with the patient or authorized representative who has indicated his/her understanding and acceptance.     Dental advisory given and Interpreter used for interveiw  Plan Discussed with:  CRNA  Anesthesia Plan Comments: (See PAT note 11/05/22)       Anesthesia Quick Evaluation

## 2022-11-06 NOTE — Progress Notes (Signed)
Anesthesia Chart Review   Case: [id number] Date/Time: 11/09/22 1620   Procedure: Left knee arthroscopy; meniscal debridement (Left: Knee)   Anesthesia type: Choice   Pre-op diagnosis: Left knee medial meniscal tear   Location: [facility] ROOM 10 / [facility]S   Surgeons: Gaynelle Arabian, MD       DISCUSSION:82 y.o. never smoker with h/o HTN, CAD, Stake 3a CKD, left knee meniscal tear scheduled for above procedure 11/09/2022 with Dr. Gaynelle Arabian.   Pt last seen by cardiology 10/26/2022 for preoperative evaluation.  Per OV note, 'Jennifer Russo is doing very well from a cardiac standpoint.  She has a history of coronary artery disease with stenting greater than 10 years ago.  She will be at low risk to stop her Plavix for 5 to 7 days prior to surgery.   She also has moderate aortic insufficiency and mild aortic stenosis.  She is tolerating this very well.  Overall she is at low risk for her upcoming knee surgery."  Anticipate pt can proceed with planned procedure barring acute status change.   VS: BP (!) 153/59   Pulse 64   Temp 36.8 C (Oral)   Resp 16   Ht 5\' 3"  (1.6 m)   Wt 58 kg   SpO2 99%   BMI 22.64 kg/m   PROVIDERS: Deland Pretty, MD is PCP   Mertie Moores, MD is Cardiologist  LABS: Labs reviewed: Acceptable for surgery. (all labs ordered are listed, but only abnormal results are displayed)  Labs Reviewed  BASIC METABOLIC PANEL - Abnormal; Notable for the following components:      Result Value   Sodium 132 (*)    Glucose, Bld 104 (*)    BUN 28 (*)    Creatinine, Ser 1.41 (*)    GFR, Estimated 37 (*)    All other components within normal limits  SURGICAL PCR SCREEN     IMAGES:   EKG:   CV: Echo 09/18/22 1. AI appears worse compared to previous study 06/02/19 (at least  moderate; best visualized on apical 3 chamber view).   2. Left ventricular ejection fraction, by estimation, is 60 to 65%. The  left ventricle has normal function. The left ventricle has no regional  wall  motion abnormalities. Left ventricular diastolic parameters are  consistent with Grade I diastolic  dysfunction (impaired relaxation).   3. Right ventricular systolic function is normal. The right ventricular  size is normal.   4. The mitral valve is normal in structure. Trivial mitral valve  regurgitation. No evidence of mitral stenosis.   5. The aortic valve is calcified. Aortic valve regurgitation is moderate.  Mild aortic valve stenosis.   6. The inferior vena cava is normal in size with greater than 50%  respiratory variability, suggesting right atrial pressure of 3 mmHg.  Past Medical History:  Diagnosis Date   Arthritis    Asymptomatic carotid artery stenosis    CAD (coronary artery disease)    s/p PCI- LAD and RCA with drug-eluting stents, PTCA in 2009 to treat in stent restenosis in RCA   Essential hypertension    HLD (hyperlipidemia)    Hypothyroidism     Past Surgical History:  Procedure Laterality Date   CORONARY ANGIOPLASTY  03-30-05 / 04-14-05   CORONARY STENT PLACEMENT     FEMORAL ARTERY REPAIR Right 07-03-08   Groin area   KNEE SURGERY  07/2016    MEDICATIONS:  amLODipine (NORVASC) 10 MG tablet   Ascorbic Acid (VITAMIN C) 1000 MG tablet  calcium carbonate (OS-CAL) 600 MG TABS tablet   Cholecalciferol (VITAMIN D) 2000 UNITS CAPS   clopidogrel (PLAVIX) 75 MG tablet   levothyroxine (SYNTHROID, LEVOTHROID) 50 MCG tablet   metoprolol (LOPRESSOR) 50 MG tablet   Multiple Vitamins-Minerals (MULTIVITAL) tablet   Omega-3 Fatty Acids (FISH OIL) 1000 MG CAPS   Polyethyl Glycol-Propyl Glycol (SYSTANE OP)   rosuvastatin (CRESTOR) 20 MG tablet   spironolactone (ALDACTONE) 50 MG tablet   telmisartan-hydrochlorothiazide (MICARDIS HCT) 80-25 MG per tablet   tobramycin (TOBREX) 0.3 % ophthalmic solution   No current facility-administered medications for this encounter.    Jennifer Felix Ward, PA-C [facility] Pre-Surgical Testing [phone]

## 2022-11-09 ENCOUNTER — Other Ambulatory Visit: Payer: Self-pay

## 2022-11-09 ENCOUNTER — Ambulatory Visit (HOSPITAL_BASED_OUTPATIENT_CLINIC_OR_DEPARTMENT_OTHER): Payer: Medicare Other | Admitting: Anesthesiology

## 2022-11-09 ENCOUNTER — Encounter (HOSPITAL_COMMUNITY): Payer: Self-pay | Admitting: Orthopedic Surgery

## 2022-11-09 ENCOUNTER — Ambulatory Visit (HOSPITAL_COMMUNITY): Payer: Medicare Other | Admitting: Physician Assistant

## 2022-11-09 ENCOUNTER — Ambulatory Visit (HOSPITAL_COMMUNITY)
Admission: RE | Admit: 2022-11-09 | Discharge: 2022-11-09 | Disposition: A | Discharge status: Home / Self Care | Payer: Medicare Other | Attending: Orthopedic Surgery | Admitting: Orthopedic Surgery

## 2022-11-09 ENCOUNTER — Encounter (HOSPITAL_COMMUNITY)
Admission: RE | Disposition: A | Discharge status: Home / Self Care | Payer: Self-pay | Source: Home / Self Care | Attending: Orthopedic Surgery

## 2022-11-09 DIAGNOSIS — Z01818 Encounter for other preprocedural examination: Secondary | ICD-10-CM

## 2022-11-09 DIAGNOSIS — S83242A Other tear of medial meniscus, current injury, left knee, initial encounter: Secondary | ICD-10-CM | POA: Diagnosis not present

## 2022-11-09 DIAGNOSIS — E039 Hypothyroidism, unspecified: Secondary | ICD-10-CM | POA: Insufficient documentation

## 2022-11-09 DIAGNOSIS — Z955 Presence of coronary angioplasty implant and graft: Secondary | ICD-10-CM | POA: Diagnosis not present

## 2022-11-09 DIAGNOSIS — I10 Essential (primary) hypertension: Secondary | ICD-10-CM

## 2022-11-09 DIAGNOSIS — S83249A Other tear of medial meniscus, current injury, unspecified knee, initial encounter: Secondary | ICD-10-CM | POA: Diagnosis present

## 2022-11-09 DIAGNOSIS — M2419 Other articular cartilage disorders, other specified site: Secondary | ICD-10-CM | POA: Diagnosis not present

## 2022-11-09 DIAGNOSIS — X58XXXA Exposure to other specified factors, initial encounter: Secondary | ICD-10-CM | POA: Diagnosis not present

## 2022-11-09 DIAGNOSIS — I2510 Atherosclerotic heart disease of native coronary artery without angina pectoris: Secondary | ICD-10-CM | POA: Insufficient documentation

## 2022-11-09 HISTORY — PX: KNEE ARTHROSCOPY: SHX127

## 2022-11-09 SURGERY — ARTHROSCOPY, KNEE
Anesthesia: General | Site: Knee | Laterality: Left

## 2022-11-09 MED ORDER — BUPIVACAINE-EPINEPHRINE (PF) 0.25% -1:200000 IJ SOLN
INTRAMUSCULAR | Status: AC
  Filled 2022-11-09: qty 30

## 2022-11-09 MED ORDER — MIDAZOLAM HCL 2 MG/2ML IJ SOLN: 1.0000 mg | INTRAMUSCULAR | Status: DC

## 2022-11-09 MED ORDER — EPHEDRINE 5 MG/ML INJ
INTRAVENOUS | Status: AC
  Filled 2022-11-09: qty 5

## 2022-11-09 MED ORDER — LIDOCAINE HCL (PF) 2 % IJ SOLN
INTRAMUSCULAR | Status: AC
  Filled 2022-11-09: qty 5

## 2022-11-09 MED ORDER — LACTATED RINGERS IV SOLN: INTRAVENOUS | Status: DC

## 2022-11-09 MED ORDER — ONDANSETRON HCL 4 MG/2ML IJ SOLN
INTRAMUSCULAR | Status: AC
  Filled 2022-11-09: qty 2

## 2022-11-09 MED ORDER — METHOCARBAMOL 500 MG PO TABS: 500.0000 mg | ORAL_TABLET | Freq: Four times a day (QID) | ORAL | 0 refills | Status: DC | PRN

## 2022-11-09 MED ORDER — HYDROCODONE-ACETAMINOPHEN 5-325 MG PO TABS: 1.0000 | ORAL_TABLET | Freq: Four times a day (QID) | ORAL | 0 refills | Status: DC | PRN

## 2022-11-09 MED ORDER — EPHEDRINE SULFATE-NACL 50-0.9 MG/10ML-% IV SOSY
PREFILLED_SYRINGE | INTRAVENOUS | Status: DC | PRN
  Administered 2022-11-09: 10 mg via INTRAVENOUS

## 2022-11-09 MED ORDER — ORAL CARE MOUTH RINSE: 15.0000 mL | Freq: Once | OROMUCOSAL | Status: AC

## 2022-11-09 MED ORDER — FENTANYL CITRATE PF 50 MCG/ML IJ SOSY
50.0000 ug | PREFILLED_SYRINGE | INTRAMUSCULAR | Status: DC
  Administered 2022-11-09: 50 ug via INTRAVENOUS

## 2022-11-09 MED ORDER — DEXAMETHASONE SODIUM PHOSPHATE 10 MG/ML IJ SOLN
INTRAMUSCULAR | Status: AC
  Filled 2022-11-09: qty 1

## 2022-11-09 MED ORDER — CHLORHEXIDINE GLUCONATE 4 % EX LIQD: 60.0000 mL | Freq: Once | CUTANEOUS | Status: DC

## 2022-11-09 MED ORDER — BUPIVACAINE-EPINEPHRINE (PF) 0.5% -1:200000 IJ SOLN
INTRAMUSCULAR | Status: DC | PRN
  Administered 2022-11-09: 20 mL

## 2022-11-09 MED ORDER — ONDANSETRON HCL 4 MG/2ML IJ SOLN
INTRAMUSCULAR | Status: DC | PRN
  Administered 2022-11-09: 4 mg via INTRAVENOUS

## 2022-11-09 MED ORDER — BUPIVACAINE-EPINEPHRINE 0.25% -1:200000 IJ SOLN
INTRAMUSCULAR | Status: DC | PRN
  Administered 2022-11-09: 30 mL

## 2022-11-09 MED ORDER — DEXAMETHASONE SODIUM PHOSPHATE 10 MG/ML IJ SOLN
8.0000 mg | Freq: Once | INTRAMUSCULAR | Status: AC
  Administered 2022-11-09: 4 mg via INTRAVENOUS

## 2022-11-09 MED ORDER — CHLORHEXIDINE GLUCONATE 0.12 % MT SOLN
15.0000 mL | Freq: Once | OROMUCOSAL | Status: AC
  Administered 2022-11-09: 15 mL via OROMUCOSAL

## 2022-11-09 MED ORDER — SODIUM CHLORIDE 0.9 % IR SOLN
Status: DC | PRN
  Administered 2022-11-09 (×2): 3000 mL

## 2022-11-09 MED ORDER — PROPOFOL 10 MG/ML IV BOLUS
INTRAVENOUS | Status: AC
  Filled 2022-11-09: qty 20

## 2022-11-09 MED ORDER — ACETAMINOPHEN 10 MG/ML IV SOLN
1000.0000 mg | Freq: Four times a day (QID) | INTRAVENOUS | Status: DC
  Administered 2022-11-09: 1000 mg via INTRAVENOUS
  Filled 2022-11-09: qty 100

## 2022-11-09 MED ORDER — CEFAZOLIN SODIUM-DEXTROSE 2-4 GM/100ML-% IV SOLN
2.0000 g | INTRAVENOUS | Status: AC
  Administered 2022-11-09: 2 g via INTRAVENOUS
  Filled 2022-11-09: qty 100

## 2022-11-09 MED ORDER — POVIDONE-IODINE 10 % EX SWAB
2.0000 | Freq: Once | CUTANEOUS | Status: AC
  Administered 2022-11-09: 2 via TOPICAL

## 2022-11-09 MED ORDER — FENTANYL CITRATE PF 50 MCG/ML IJ SOSY
PREFILLED_SYRINGE | INTRAMUSCULAR | Status: AC
  Filled 2022-11-09: qty 1

## 2022-11-09 MED ORDER — BUPIVACAINE HCL (PF) 0.25 % IJ SOLN
INTRAMUSCULAR | Status: DC | PRN
  Administered 2022-11-09: 14 mL

## 2022-11-09 MED ORDER — LIDOCAINE 2% (20 MG/ML) 5 ML SYRINGE
INTRAMUSCULAR | Status: DC | PRN
  Administered 2022-11-09: 40 mg via INTRAVENOUS

## 2022-11-09 MED ORDER — PROPOFOL 10 MG/ML IV BOLUS
INTRAVENOUS | Status: DC | PRN
  Administered 2022-11-09: 80 mg via INTRAVENOUS

## 2022-11-09 SURGICAL SUPPLY — 37 items
BAG COUNTER SPONGE SURGICOUNT (BAG) IMPLANT
BAG SPNG CNTER NS LX DISP (BAG)
BLADE SHAVER TORPEDO 4X13 (MISCELLANEOUS) ×1 IMPLANT
BNDG CMPR 5X62 HK CLSR LF (GAUZE/BANDAGES/DRESSINGS) ×1
BNDG CMPR MED 10X6 ELC LF (GAUZE/BANDAGES/DRESSINGS) ×1
BNDG ELASTIC 6INX 5YD STR LF (GAUZE/BANDAGES/DRESSINGS) ×1 IMPLANT
BNDG ELASTIC 6X10 VLCR STRL LF (GAUZE/BANDAGES/DRESSINGS) IMPLANT
COVER SURGICAL LIGHT HANDLE (MISCELLANEOUS) ×1 IMPLANT
DRAPE ARTHROSCOPY W/POUCH 114 (DRAPES) ×1 IMPLANT
DRAPE U-SHAPE 47X51 STRL (DRAPES) ×1 IMPLANT
DRSG EMULSION OIL 3X3 NADH (GAUZE/BANDAGES/DRESSINGS) ×1 IMPLANT
DRSG TEGADERM 4X4.75 (GAUZE/BANDAGES/DRESSINGS) IMPLANT
DURAPREP 26ML APPLICATOR (WOUND CARE) ×1 IMPLANT
GAUZE 4X4 16PLY ~~LOC~~+RFID DBL (SPONGE) ×1 IMPLANT
GAUZE PAD ABD 7.5X8 STRL (GAUZE/BANDAGES/DRESSINGS) IMPLANT
GAUZE PAD ABD 8X10 STRL (GAUZE/BANDAGES/DRESSINGS) ×1 IMPLANT
GAUZE SPONGE 4X4 12PLY STRL (GAUZE/BANDAGES/DRESSINGS) ×1 IMPLANT
GLOVE BIO SURGEON STRL SZ 6.5 (GLOVE) ×2 IMPLANT
GLOVE BIO SURGEON STRL SZ8 (GLOVE) ×2 IMPLANT
GLOVE BIOGEL PI IND STRL 7.0 (GLOVE) ×2 IMPLANT
GLOVE BIOGEL PI IND STRL 8 (GLOVE) ×1 IMPLANT
GLOVE SURG SS PI 7.0 STRL IVOR (GLOVE) ×1 IMPLANT
GOWN STRL REUS W/ TWL LRG LVL3 (GOWN DISPOSABLE) ×1 IMPLANT
GOWN STRL REUS W/TWL LRG LVL3 (GOWN DISPOSABLE) ×1
KIT BASIN OR (CUSTOM PROCEDURE TRAY) ×1 IMPLANT
KIT TURNOVER KIT A (KITS) IMPLANT
MANIFOLD NEPTUNE II (INSTRUMENTS) ×1 IMPLANT
PACK ARTHROSCOPY WL (CUSTOM PROCEDURE TRAY) ×1 IMPLANT
PADDING CAST ABS COTTON 6X4 NS (CAST SUPPLIES) IMPLANT
PADDING CAST COTTON 6X4 STRL (CAST SUPPLIES) ×1 IMPLANT
PORT APPOLLO RF 90DEGREE MULTI (SURGICAL WAND) ×1 IMPLANT
PROTECTOR NERVE ULNAR (MISCELLANEOUS) ×1 IMPLANT
SUT ETHILON 4 0 PS 2 18 (SUTURE) ×1 IMPLANT
TOWEL OR 17X26 10 PK STRL BLUE (TOWEL DISPOSABLE) ×1 IMPLANT
TUBING ARTHROSCOPY IRRIG 16FT (MISCELLANEOUS) ×1 IMPLANT
TUBING CONNECTING 10 (TUBING) ×1 IMPLANT
WRAP KNEE MAXI GEL POST OP (GAUZE/BANDAGES/DRESSINGS) ×1 IMPLANT

## 2022-11-09 NOTE — Anesthesia Procedure Notes (Signed)
Procedure Name: LMA Insertion Date/Time: 11/09/2022 3:17 PM  Performed by: Niel Hummer, CRNAPre-anesthesia Checklist: Patient identified, Emergency Drugs available, Suction available and Patient being monitored Patient Re-evaluated:Patient Re-evaluated prior to induction Oxygen Delivery Method: Circle system utilized Preoxygenation: Pre-oxygenation with 100% oxygen Induction Type: IV induction LMA: LMA with gastric port inserted LMA Size: 3.0 Number of attempts: 1 Dental Injury: Teeth and Oropharynx as per pre-operative assessment

## 2022-11-09 NOTE — Discharge Instructions (Signed)
Dr. Gaynelle Arabian [facility] Specialist [facility] [address] [place], [place] [address] [phone]   Arthroscopic Procedure, Knee An arthroscopic procedure can find what is wrong with your knee. PROCEDURE Arthroscopy is a surgical technique that allows your orthopedic surgeon to diagnose and treat your knee injury with accuracy. They will look into your knee through a small instrument. This is almost like a small (pencil sized) telescope. Because arthroscopy affects your knee less than open knee surgery, you can anticipate a more rapid recovery. Taking an active role by following your caregiver's instructions will help with rapid and complete recovery. Use crutches, rest, elevation, ice, and knee exercises as instructed. The length of recovery depends on various factors including type of injury, age, physical condition, medical conditions, and your rehabilitation. Your knee is the joint between the large bones (femur and tibia) in your leg. Cartilage covers these bone ends which are smooth and slippery and allow your knee to bend and move smoothly. Two menisci, thick, semi-lunar shaped pads of cartilage which form a rim inside the joint, help absorb shock and stabilize your knee. Ligaments bind the bones together and support your knee joint. Muscles move the joint, help support your knee, and take stress off the joint itself. Because of this all programs and physical therapy to rehabilitate an injured or repaired knee require rebuilding and strengthening your muscles. AFTER THE PROCEDURE After the procedure, you will be moved to a recovery area until most of the effects of the medication have worn off. Your caregiver will discuss the test results with you.  Only take over-the-counter or prescription medicines for pain, discomfort, or fever as directed by your caregiver.  SEEK MEDICAL CARE IF:  You have increased bleeding from your wounds.  You see redness, swelling, or  have increasing pain in your wounds.  You have pus coming from your wound.  You have an oral temperature above 102 F (38.9 C).  You notice a bad smell coming from the wound or dressing.  You have severe pain with any motion of your knee.  SEEK IMMEDIATE MEDICAL CARE IF:  You develop a rash.  You have difficulty breathing.  You have any allergic problems.  FURTHER INSTRUCTIONS:  ICE to the affected knee every three hours for 30 minutes at a time and then as needed for pain and swelling.  Continue to use ice on the knee for pain and swelling from surgery. You may notice swelling that will progress down to the foot and ankle.  This is normal after surgery.  Elevate the leg when you are not up walking on it.    DIET You may resume your previous home diet once your are discharged from the hospital.  DRESSING / WOUND CARE / SHOWERING You may change your dressing 3-5 days after surgery.  Then change the dressing every day with sterile gauze.  Please use good hand washing techniques before changing the dressing.  Do not use any lotions or creams on the incision until instructed by your surgeon. and You may shower 3 days after surgery, but keep the wounds dry during showering.  You may use an occlusive plastic wrap (Press'n Seal for example), NO SOAKING/SUBMERGING IN THE BATHTUB.  If the bandage gets wet, change with a clean dry gauze.  If the incision gets wet, pat the wound dry with a clean towel. You may start showering two days after being discharged home but do not submerge the incisions under water.  Change  dressing 48 hours after the procedure and then cover the small incisions with band aids until your follow up visit. Change the surgical dressings daily and reapply a dry dressing each time.   ACTIVITY Walk with your walker as instructed. Use walker as long as suggested by your caregivers. Avoid periods of inactivity such as sitting longer than an hour when not asleep. This helps prevent  blood clots.  You may resume a sexual relationship in one month or when given the OK by your doctor.  You may return to work once you are cleared by your doctor.  Do not drive a car for 6 weeks or until released by you surgeon.  Do not drive while taking narcotics.  WEIGHT BEARING Weight bearing as tolerated with assist device (walker, cane, etc) as directed, use it as long as suggested by your surgeon or therapist, typically at least 4-6 weeks.  POSTOPERATIVE CONSTIPATION PROTOCOL Constipation - defined medically as fewer than three stools per week and severe constipation as less than one stool per week.  One of the most common issues patients have following surgery is constipation.  Even if you have a regular bowel pattern at home, your normal regimen is likely to be disrupted due to multiple reasons following surgery.  Combination of anesthesia, postoperative narcotics, change in appetite and fluid intake all can affect your bowels.  In order to avoid complications following surgery, here are some recommendations in order to help you during your recovery period.  Colace (docusate) - Pick up an over-the-counter form of Colace or another stool softener and take twice a day as long as you are requiring postoperative pain medications.  Take with a full glass of water daily.  If you experience loose stools or diarrhea, hold the colace until you stool forms back up.  If your symptoms do not get better within 1 week or if they get worse, check with your doctor.  Dulcolax (bisacodyl) - Pick up over-the-counter and take as directed by the product packaging as needed to assist with the movement of your bowels.  Take with a full glass of water.  Use this product as needed if not relieved by Colace only.   MiraLax (polyethylene glycol) - Pick up over-the-counter to have on hand.  MiraLax is a solution that will increase the amount of water in your bowels to assist with bowel movements.  Take as directed and  can mix with a glass of water, juice, soda, coffee, or tea.  Take if you go more than two days without a movement. Do not use MiraLax more than once per day. Call your doctor if you are still constipated or irregular after using this medication for 7 days in a row.  If you continue to have problems with postoperative constipation, please contact the office for further assistance and recommendations.  If you experience "the worst abdominal pain ever" or develop nausea or vomiting, please contact the office immediatly for further recommendations for treatment.  ITCHING  If you experience itching with your medications, try taking only a single pain pill, or even half a pain pill at a time.  You can also use Benadryl over the counter for itching or also to help with sleep.   TED HOSE STOCKINGS Wear the elastic stockings on both legs for three weeks following surgery during the day but you may remove then at night for sleeping.  MEDICATIONS See your medication summary on the "After Visit Summary" that the nursing staff will review with  you prior to discharge.  You may have some home medications which will be placed on hold until you complete the course of blood thinner medication.  It is important for you to complete the blood thinner medication as prescribed by your surgeon.  Continue your approved medications as instructed at time of discharge. Do not drive while taking narcotics.   PRECAUTIONS If you experience chest pain or shortness of breath - call 911 immediately for transfer to the hospital emergency department.  If you develop a fever greater that 101 F, purulent drainage from wound, increased redness or drainage from wound, foul odor from the wound/dressing, or calf pain - CONTACT YOUR SURGEON.                                                   FOLLOW-UP APPOINTMENTS Make sure you keep all of your appointments after your operation with your surgeon and caregivers. You should call the office  at [phone]  and make an appointment for approximately one week after the date of your surgery or on the date instructed by your surgeon outlined in the "After Visit Summary".  RANGE OF MOTION AND STRENGTHENING EXERCISES  Rehabilitation of the knee is important following a knee injury or an operation. After just a few days of immobilization, the muscles of the thigh which control the knee become weakened and shrink (atrophy). Knee exercises are designed to build up the tone and strength of the thigh muscles and to improve knee motion. Often times heat used for twenty to thirty minutes before working out will loosen up your tissues and help with improving the range of motion but do not use heat for the first two weeks following surgery. These exercises can be done on a training (exercise) mat, on the floor, on a table or on a bed. Use what ever works the best and is most comfortable for you Knee exercises include:  QUAD STRENGTHENING EXERCISES Strengthening Quadriceps Sets  Tighten muscles on top of thigh by pushing knees down into floor or table. Hold for 20 seconds. Repeat 10 times. Do 2 sessions per day.     Strengthening Terminal Knee Extension  With knee bent over bolster, straighten knee by tightening muscle on top of thigh. Be sure to keep bottom of knee on bolster. Hold for 20 seconds. Repeat 10 times. Do 2 sessions per day.   Straight Leg with Bent Knee  Lie on back with opposite leg bent. Keep involved knee slightly bent at knee and raise leg 4-6". Hold for 10 seconds. Repeat 20 times per set. Do 2 sets per session. Do 2 sessions per day.

## 2022-11-09 NOTE — Anesthesia Postprocedure Evaluation (Signed)
Anesthesia Post Note  Patient: Jennifer Russo  Procedure(s) Performed: Left knee arthroscopy; meniscal debridement (Left: Knee)     Patient location during evaluation: PACU Anesthesia Type: General Level of consciousness: awake and alert Pain management: pain level controlled Vital Signs Assessment: post-procedure vital signs reviewed and stable Respiratory status: spontaneous breathing, nonlabored ventilation and respiratory function stable Cardiovascular status: blood pressure returned to baseline and stable Postop Assessment: no apparent nausea or vomiting Anesthetic complications: no  No notable events documented.  Last Vitals:  Vitals:   11/09/22 1630 11/09/22 1641  BP: (!) 152/51 (!) 158/61  Pulse: (!) 52 (!) 59  Resp: 13   Temp: 36.4 C   SpO2: 99% 99%    Last Pain:  Vitals:   11/09/22 1641  TempSrc:   PainSc: 0-No pain                 Nikesha Kwasny,W. EDMOND

## 2022-11-09 NOTE — Transfer of Care (Signed)
Immediate Anesthesia Transfer of Care Note  Patient: Jennifer Russo  Procedure(s) Performed: Left knee arthroscopy; meniscal debridement (Left: Knee)  Patient Location: PACU  Anesthesia Type:General  Level of Consciousness: drowsy  Airway & Oxygen Therapy: Patient Spontanous Breathing and Patient connected to face mask oxygen  Post-op Assessment: Report given to RN, Post -op Vital signs reviewed and stable, and Patient moving all extremities X 4  Post vital signs: Reviewed and stable  Last Vitals:  Vitals Value Taken Time  BP 137/43 11/09/22 1600  Temp    Pulse 52 11/09/22 1600  Resp 14 11/09/22 1600  SpO2 100 % 11/09/22 1600  Vitals shown include unvalidated device data.  Last Pain:  Vitals:   11/09/22 1505  TempSrc:   PainSc: 0-No pain         Complications: No notable events documented.

## 2022-11-09 NOTE — H&P (Signed)
CC- Jennifer Russo is a 82 y.o. female who presents with left knee pain.  HPI- . Knee Pain: Patient presents with knee pain involving the  left knee. Onset of the symptoms was several months ago. Inciting event: none known. Current symptoms include giving out, pain located medially, stiffness, and swelling. Pain is aggravated by pivoting, rising after sitting, squatting, standing, and walking.  Patient has had prior knee problems. Evaluation to date: MRI: medial meniscal tear . Treatment to date: rest.  Past Medical History:  Diagnosis Date   Arthritis    Asymptomatic carotid artery stenosis    CAD (coronary artery disease)    s/p PCI- LAD and RCA with drug-eluting stents, PTCA in 2009 to treat in stent restenosis in RCA   Essential hypertension    HLD (hyperlipidemia)    Hypothyroidism     Past Surgical History:  Procedure Laterality Date   CORONARY ANGIOPLASTY  03-30-05 / 04-14-05   CORONARY STENT PLACEMENT     FEMORAL ARTERY REPAIR Right 07-03-08   Groin area   KNEE SURGERY  07/2016    Prior to Admission medications   Medication Sig Start Date End Date Taking? Authorizing Provider  amLODipine (NORVASC) 10 MG tablet Take 10 mg by mouth daily.   Yes [provider]  Ascorbic Acid (VITAMIN C) 1000 MG tablet Take 1,000 mg by mouth daily.   Yes [provider]  calcium carbonate (OS-CAL) 600 MG TABS tablet Take 600 mg by mouth 2 (two) times daily with a meal.   Yes [provider]  Cholecalciferol (VITAMIN D) 2000 UNITS CAPS Take 2,000 Units by mouth daily.   Yes [provider]  clopidogrel (PLAVIX) 75 MG tablet Take 75 mg by mouth daily.   Yes [provider]  levothyroxine (SYNTHROID, LEVOTHROID) 50 MCG tablet Take 50 mcg by mouth daily before breakfast.   Yes [provider]  metoprolol (LOPRESSOR) 50 MG tablet Take 50 mg by mouth 2 (two) times daily.   Yes [provider]  Multiple Vitamins-Minerals (MULTIVITAL) tablet  Take 1 tablet by mouth daily.   Yes [provider]  Omega-3 Fatty Acids (FISH OIL) 1000 MG CAPS Take 1,000 mg by mouth daily.   Yes [provider]  Polyethyl Glycol-Propyl Glycol (SYSTANE OP) Place 1 drop into both eyes 3 (three) times daily as needed (drye eyes).   Yes [provider]  rosuvastatin (CRESTOR) 20 MG tablet Take 20 mg by mouth daily.   Yes [provider]  spironolactone (ALDACTONE) 50 MG tablet Take 50 mg by mouth daily. 10/26/22  Yes [provider]  telmisartan-hydrochlorothiazide (MICARDIS HCT) 80-25 MG per tablet Take 1 tablet by mouth daily.   Yes [provider]  tobramycin (TOBREX) 0.3 % ophthalmic solution Place 1 drop into both eyes See admin instructions. Instill one drop into both eyes when receiving eye injections. 08/25/22  Yes [provider]   Knee Exam antalgic gait, soft tissue tenderness over medial joint line, no effusion, collaterals intact  Physical Examination: General appearance - alert, well appearing, and in no distress Mental status - alert, oriented to person, place, and time Chest - clear to auscultation, no wheezes, rales or rhonchi, symmetric air entry Heart - normal rate, regular rhythm, normal S1, S2, no murmurs, rubs, clicks or gallops Abdomen - soft, nontender, nondistended, no masses or organomegaly Neurological - alert, oriented, normal speech, no focal findings or movement disorder noted   Asessment/Plan--- Left knee medial meniscal tear- - Plan left  knee arthroscopy with meniscal debridement. Procedure risks and potential comps discussed with patient who elects to proceed. Goals are decreased pain and increased function with a high likelihood of achieving both

## 2022-11-09 NOTE — Op Note (Signed)
Operative Report- KNEE ARTHROSCOPY  Preoperative diagnosis-  Left knee medial meniscal tear  Postoperative diagnosis Left- knee medial meniscal tear plus  chondral defect medial femoral condyle  Procedure- Left knee arthroscopy with medial  meniscal debridement and chondroplasty   Surgeon- Dione Plover. Aneri Slagel, MD  Anesthesia-General  EBL-  Minimal  Complications- None  Condition- PACU - hemodynamically stable.  Brief clinical note- -Jennifer Russo is a 82 y.o.  female with a several month history of Left knee pain and mechanical symptoms. Exam and history suggested medial  meniscal tear confirmed by MRI. The patient presents now for arthroscopy and debridement   Procedure in detail -       After successful administration of General anesthetic, a tourmiquet is placed high on the Left  thigh and the Left lower extremity is prepped and draped in the usual sterile fashion. Time out is performed by the surgical team. Standard superomedial and inferolateral portal sites are marked and incisions made with an 11 blade. The inflow cannula is passed through the superomedial portal and camera through the inferolateral portal and inflow is initiated. Arthroscopic visualization proceeds.      The undersurface of the patella and trochlea are visualized and the are normal. The medial and lateral gutters are visualized and there are no loose bodies. Flexion and valgus force is applied to the knee and the medial compartment is entered. A spinal needle is passed into the joint through the site marked for the inferomedial portal. A small incision is made and the dilator passed into the joint. The findings for the medial compartment are unstable tear posterior horn medial meniscus and small chondral defect medial femoral condyle . The tear is debrided to a stable base with baskets and a shaver and sealed off with the Arthrocare. The shaver is used to debride the unstable cartilage to a stable cartilaginous base  with stable edges. It is probed and found to be stable.    The intercondylar notch is visualized and the ACL appears normal . The lateral compartment is entered and the findings are normal .       The joint is again inspected and there are no other tears, defects or loose bodies identified. The arthroscopic equipment is then removed from the inferior portals which are closed with interrupted 4-0 nylon. 20 ml of .25% Marcaine with epinephrine are injected through the inflow cannula and the cannula is then removed and the portal closed with nylon. The incisions are cleaned and dried and a bulky sterile dressing is applied. The patient is then awakened and transported to recovery in stable condition.   11/09/2022, 4:23 PM

## 2022-11-10 ENCOUNTER — Encounter (HOSPITAL_COMMUNITY): Payer: Self-pay | Admitting: Orthopedic Surgery

## 2022-11-18 DIAGNOSIS — H353231 Exudative age-related macular degeneration, bilateral, with active choroidal neovascularization: Secondary | ICD-10-CM | POA: Diagnosis not present

## 2022-11-18 DIAGNOSIS — H43812 Vitreous degeneration, left eye: Secondary | ICD-10-CM | POA: Diagnosis not present

## 2022-11-18 DIAGNOSIS — H35373 Puckering of macula, bilateral: Secondary | ICD-10-CM | POA: Diagnosis not present

## 2022-11-30 ENCOUNTER — Ambulatory Visit: Payer: Medicare Other | Admitting: Cardiovascular Disease

## 2022-12-10 DIAGNOSIS — L639 Alopecia areata, unspecified: Secondary | ICD-10-CM | POA: Diagnosis not present

## 2022-12-10 DIAGNOSIS — L930 Discoid lupus erythematosus: Secondary | ICD-10-CM | POA: Diagnosis not present

## 2023-01-28 DIAGNOSIS — M25562 Pain in left knee: Secondary | ICD-10-CM | POA: Diagnosis not present

## 2023-03-03 DIAGNOSIS — H353220 Exudative age-related macular degeneration, left eye, stage unspecified: Secondary | ICD-10-CM | POA: Diagnosis not present

## 2023-03-03 DIAGNOSIS — Z961 Presence of intraocular lens: Secondary | ICD-10-CM | POA: Diagnosis not present

## 2023-03-03 DIAGNOSIS — H43813 Vitreous degeneration, bilateral: Secondary | ICD-10-CM | POA: Diagnosis not present

## 2023-03-03 DIAGNOSIS — H524 Presbyopia: Secondary | ICD-10-CM | POA: Diagnosis not present

## 2023-03-03 DIAGNOSIS — H353210 Exudative age-related macular degeneration, right eye, stage unspecified: Secondary | ICD-10-CM | POA: Diagnosis not present

## 2023-03-03 DIAGNOSIS — H35373 Puckering of macula, bilateral: Secondary | ICD-10-CM | POA: Diagnosis not present

## 2023-03-03 DIAGNOSIS — H5213 Myopia, bilateral: Secondary | ICD-10-CM | POA: Diagnosis not present

## 2023-03-03 DIAGNOSIS — H52223 Regular astigmatism, bilateral: Secondary | ICD-10-CM | POA: Diagnosis not present

## 2023-03-24 DIAGNOSIS — H35373 Puckering of macula, bilateral: Secondary | ICD-10-CM | POA: Diagnosis not present

## 2023-03-24 DIAGNOSIS — H43812 Vitreous degeneration, left eye: Secondary | ICD-10-CM | POA: Diagnosis not present

## 2023-03-24 DIAGNOSIS — H353233 Exudative age-related macular degeneration, bilateral, with inactive scar: Secondary | ICD-10-CM | POA: Diagnosis not present

## 2023-04-02 DIAGNOSIS — Z5189 Encounter for other specified aftercare: Secondary | ICD-10-CM | POA: Diagnosis not present

## 2023-04-12 DIAGNOSIS — M25562 Pain in left knee: Secondary | ICD-10-CM | POA: Diagnosis not present

## 2023-05-04 ENCOUNTER — Ambulatory Visit: Payer: Medicare Other | Attending: Physician Assistant | Admitting: Cardiovascular Disease

## 2023-05-04 ENCOUNTER — Encounter: Payer: Self-pay | Admitting: Cardiovascular Disease

## 2023-05-04 VITALS — BP 120/60 | HR 54 | Ht 63.0 in | Wt 123.8 lb

## 2023-05-04 DIAGNOSIS — I6523 Occlusion and stenosis of bilateral carotid arteries: Secondary | ICD-10-CM | POA: Diagnosis not present

## 2023-05-04 DIAGNOSIS — I2510 Atherosclerotic heart disease of native coronary artery without angina pectoris: Secondary | ICD-10-CM | POA: Diagnosis not present

## 2023-05-04 DIAGNOSIS — I10 Essential (primary) hypertension: Secondary | ICD-10-CM | POA: Diagnosis not present

## 2023-05-04 DIAGNOSIS — I350 Nonrheumatic aortic (valve) stenosis: Secondary | ICD-10-CM

## 2023-05-04 DIAGNOSIS — E782 Mixed hyperlipidemia: Secondary | ICD-10-CM

## 2023-05-04 NOTE — Patient Instructions (Addendum)
Medication Instructions:  Your physician recommends that you continue on your current medications as directed. Please refer to the Current Medication list given to you today.  *If you need a refill on your cardiac medications before your next appointment, please call your pharmacy*   Lab Work: NONE If you have labs (blood work) drawn today and your tests are completely normal, you will receive your results only by: MyChart Message (if you have MyChart) OR A paper copy in the mail If you have any lab test that is abnormal or we need to change your treatment, we will call you to review the results.   Testing/Procedures: ECHO (prior to next year's visit) Your physician has requested that you have an echocardiogram. Echocardiography is a painless test that uses sound waves to create images of your heart. It provides your doctor with information about the size and shape of your heart and how well your heart's chambers and valves are working. This procedure takes approximately one hour. There are no restrictions for this procedure. Please do NOT wear cologne, perfume, aftershave, or lotions (deodorant is allowed). Please arrive 15 minutes prior to your appointment time.  Follow-Up: At [facility], you and your health needs are our priority.  As part of our continuing mission to provide you with exceptional heart care, we have created designated Provider Care Teams.  These Care Teams include your primary Cardiologist (physician) and Advanced Practice Providers (APPs -  Physician Assistants and Nurse Practitioners) who all work together to provide you with the care you need, when you need it.  Your next appointment:   1 year(s)  Provider:   Tonny Bollman, MD

## 2023-05-04 NOTE — Progress Notes (Signed)
Cardiology Office Note:    Date:  05/04/2023   ID:  Jennifer, Russo 1940-11-14, MRN [id number]  PCP:  Merri Brunette, MD   [facility] Providers Cardiologist:  Tonny Bollman, MD     Referring MD: Merri Brunette, MD   Chief Complaint  Patient presents with   Hypertension    History of Present Illness:    Jennifer Russo is a 82 y.o. female with a hx of coronary artery disease, presenting for follow-up evaluation.  The patient has undergone stenting of the LAD and right coronary artery, now greater than 10 years ago.  Comorbid conditions include mild aortic stenosis, hypertension, carotid stenosis with no history of stroke or TIA, and mixed hyperlipidemia.   The patient is here alone today.  She is doing fairly well.  Her walking is more limited after knee surgery.  She states that her legs feel weak.  She otherwise is doing fine with no chest pain, chest pressure, or shortness of breath with activity.  She denies leg swelling, lightheadedness, or heart palpitations.  She is compliant with her medications.  She is had imaging studies earlier this year with both a carotid ultrasound and an echocardiogram to follow carotid stenosis and mild aortic stenosis, respectively.  Past Medical History:  Diagnosis Date   Arthritis    Asymptomatic carotid artery stenosis    CAD (coronary artery disease)    s/p PCI- LAD and RCA with drug-eluting stents, PTCA in 2009 to treat in stent restenosis in RCA   Essential hypertension    HLD (hyperlipidemia)    Hypothyroidism     Past Surgical History:  Procedure Laterality Date   CORONARY ANGIOPLASTY  03-30-05 / 04-14-05   CORONARY STENT PLACEMENT     FEMORAL ARTERY REPAIR Right 07-03-08   Groin area   KNEE ARTHROSCOPY Left 11/09/2022   Procedure: Left knee arthroscopy; meniscal debridement;  Surgeon: Ollen Gross, MD;  Location: [facility];  Service: Orthopedics;  Laterality: Left;   KNEE SURGERY  07/2016    Current Medications: Current  Meds  Medication Sig   amLODipine (NORVASC) 10 MG tablet Take 10 mg by mouth daily.   Ascorbic Acid (VITAMIN C) 1000 MG tablet Take 1,000 mg by mouth daily.   calcium carbonate (OS-CAL) 600 MG TABS tablet Take 600 mg by mouth 2 (two) times daily with a meal.   Cholecalciferol (VITAMIN D) 2000 UNITS CAPS Take 2,000 Units by mouth daily.   clopidogrel (PLAVIX) 75 MG tablet Take 75 mg by mouth daily.   levothyroxine (SYNTHROID, LEVOTHROID) 50 MCG tablet Take 50 mcg by mouth daily before breakfast.   metoprolol (LOPRESSOR) 50 MG tablet Take 50 mg by mouth 2 (two) times daily.   Multiple Vitamins-Minerals (MULTIVITAL) tablet Take 1 tablet by mouth daily.   Omega-3 Fatty Acids (FISH OIL) 1000 MG CAPS Take 1,000 mg by mouth daily.   Polyethyl Glycol-Propyl Glycol (SYSTANE OP) Place 1 drop into both eyes 3 (three) times daily as needed (drye eyes).   rosuvastatin (CRESTOR) 20 MG tablet Take 20 mg by mouth daily.   spironolactone (ALDACTONE) 50 MG tablet Take 50 mg by mouth daily.   telmisartan-hydrochlorothiazide (MICARDIS HCT) 80-25 MG per tablet Take 1 tablet by mouth daily.   tobramycin (TOBREX) 0.3 % ophthalmic solution Place 1 drop into both eyes See admin instructions. Instill one drop into both eyes when receiving eye injections.   [DISCONTINUED] HYDROcodone-acetaminophen (NORCO/VICODIN) 5-325 MG tablet Take 1 tablet by mouth every 6 (six) hours as  needed for moderate pain.   [DISCONTINUED] methocarbamol (ROBAXIN) 500 MG tablet Take 1 tablet (500 mg total) by mouth every 6 (six) hours as needed for muscle spasms.     Allergies:   Atorvastatin, Penicillins, Sulfonamide derivatives, and Tramadol   Social History   Socioeconomic History   Marital status: Married    Spouse name: Not on file   Number of children: Not on file   Years of education: Not on file   Highest education level: Not on file  Occupational History   Not on file  Tobacco Use   Smoking status: Never   Smokeless tobacco:  Never  Vaping Use   Vaping status: Never Used  Substance and Sexual Activity   Alcohol use: No    Alcohol/week: 0.0 standard drinks of alcohol   Drug use: No   Sexual activity: Not on file  Other Topics Concern   Not on file  Social History Narrative   Not on file   Social Determinants of Health   Financial Resource Strain: Not on file  Food Insecurity: Not on file  Transportation Needs: Not on file  Physical Activity: Not on file  Stress: Not on file  Social Connections: Not on file     Family History: The patient's family history includes Diabetes in her father; Hypertension in her mother.  ROS:   Please see the history of present illness.    All other systems reviewed and are negative.  EKGs/Labs/Other Studies Reviewed:    The following studies were reviewed today: Carotid US 08/2022: Summary:  Right Carotid: Velocities in the right ICA are consistent with a 40-59% st  based                on PSV and plaque morphology.   Left Carotid: Velocities in the left ICA are consistent with a 1-39%  stenosis.   Vertebrals: Bilateral vertebral arteries demonstrate antegrade flow.  Subclavians: Normal flow hemodynamics were seen in bilateral subclavian               arteries.      Echo 09/2022: 1. AI appears worse compared to previous study 06/02/19 (at least  moderate; best visualized on apical 3 chamber view).   2. Left ventricular ejection fraction, by estimation, is 60 to 65%. The  left ventricle has normal function. The left ventricle has no regional  wall motion abnormalities. Left ventricular diastolic parameters are  consistent with Grade I diastolic  dysfunction (impaired relaxation).   3. Right ventricular systolic function is normal. The right ventricular  size is normal.   4. The mitral valve is normal in structure. Trivial mitral valve  regurgitation. No evidence of mitral stenosis.   5. The aortic valve is calcified. Aortic valve regurgitation is  moderate.  Mild aortic valve stenosis.   6. The inferior vena cava is normal in size with greater than 50%  respiratory variability, suggesting right atrial pressure of 3 mmHg.   Recent Labs: 11/05/2022: BUN 28; Creatinine, Ser 1.41; Potassium 4.9; Sodium 132  Recent Lipid Panel No results found for: "CHOL", "TRIG", "HDL", "CHOLHDL", "VLDL", "LDLCALC", "LDLDIRECT"   Risk Assessment/Calculations:                Physical Exam:    VS:  BP 120/60   Pulse (!) 54   Ht 5\' 3"  (1.6 m)   Wt 123 lb 12.8 oz (56.2 kg)   SpO2 99%   BMI 21.93 kg/m     Wt Readings from Last  3 Encounters:  05/04/23 123 lb 12.8 oz (56.2 kg)  11/09/22 125 lb 10.6 oz (57 kg)  11/05/22 127 lb 12.8 oz (58 kg)     GEN:  Well nourished, pleasant elderly woman, well developed in no acute distress HEENT: Normal NECK: No JVD; R>L carotid bruits LYMPHATICS: No lymphadenopathy CARDIAC: RRR, 2/6 systolic murmur at the RUSB RESPIRATORY:  Clear to auscultation without rales, wheezing or rhonchi  ABDOMEN: Soft, non-tender, non-distended MUSCULOSKELETAL:  No edema; No deformity  SKIN: Warm and dry NEUROLOGIC:  Alert and oriented x 3 PSYCHIATRIC:  Normal affect   ASSESSMENT:    1. Coronary artery disease involving native coronary artery of native heart without angina pectoris   2. Nonrheumatic aortic valve stenosis   3. Bilateral extracranial carotid artery stenosis   4. Essential hypertension   5. Mixed hyperlipidemia    PLAN:    In order of problems listed above:  The patient is stable with no symptoms of angina after remote PCI.  She continues on clopidogrel for antiplatelet therapy as well as a high intensity statin drug.  She is experiencing no anginal symptoms. The patient's most recent echocardiogram is reviewed which demonstrated mild aortic stenosis and moderate aortic regurgitation.  On exam I am able to appreciate her aortic stenosis murmur but I do not hear diastolic murmur.  I think she should  have a follow-up echocardiogram in 1 year prior to her return office visit with me.  Otherwise continue current medical therapy. Appears clinically stable.  Continue clopidogrel and rosuvastatin.  No stroke or TIA symptoms.  Noted to have 40 to 59% right ICA stenosis and less than 40% left ICA stenosis. Blood pressure is well-controlled on multidrug therapy with amlodipine, metoprolol tartrate, and telmisartan/HCTZ. Treated with rosuvastatin.  Last LDL cholesterol on file was 42.     Medication Adjustments/Labs and Tests Ordered: Current medicines are reviewed at length with the patient today.  Concerns regarding medicines are outlined above.  Orders Placed This Encounter  Procedures   EKG 12-Lead   ECHOCARDIOGRAM COMPLETE   No orders of the defined types were placed in this encounter.   Patient Instructions  Medication Instructions:  Your physician recommends that you continue on your current medications as directed. Please refer to the Current Medication list given to you today.  *If you need a refill on your cardiac medications before your next appointment, please call your pharmacy*  Lab Work: NONE If you have labs (blood work) drawn today and your tests are completely normal, you will receive your results only by: MyChart Message (if you have MyChart) OR A paper copy in the mail If you have any lab test that is abnormal or we need to change your treatment, we will call you to review the results.  Testing/Procedures: ECHO *(prior to next year's visit) Your physician has requested that you have an echocardiogram. Echocardiography is a painless test that uses sound waves to create images of your heart. It provides your doctor with information about the size and shape of your heart and how well your heart's chambers and valves are working. This procedure takes approximately one hour. There are no restrictions for this procedure. Please do NOT wear cologne, perfume, aftershave, or  lotions (deodorant is allowed). Please arrive 15 minutes prior to your appointment time.  Follow-Up: At [facility], you and your health needs are our priority.  As part of our continuing mission to provide you with exceptional heart care, we have created designated Provider Care  Teams.  These Care Teams include your primary Cardiologist (physician) and Advanced Practice Providers (APPs -  Physician Assistants and Nurse Practitioners) who all work together to provide you with the care you need, when you need it.  Your next appointment:   1 year(s)  Provider:   Tonny Bollman, MD        Signed, Tonny Bollman, MD  05/04/2023 1:15 PM    [facility]

## 2023-06-01 DIAGNOSIS — Z5189 Encounter for other specified aftercare: Secondary | ICD-10-CM | POA: Diagnosis not present

## 2023-06-01 DIAGNOSIS — M545 Low back pain, unspecified: Secondary | ICD-10-CM | POA: Diagnosis not present

## 2023-06-10 DIAGNOSIS — M545 Low back pain, unspecified: Secondary | ICD-10-CM | POA: Diagnosis not present

## 2023-06-15 DIAGNOSIS — M5451 Vertebrogenic low back pain: Secondary | ICD-10-CM | POA: Diagnosis not present

## 2023-06-16 DIAGNOSIS — G992 Myelopathy in diseases classified elsewhere: Secondary | ICD-10-CM | POA: Diagnosis not present

## 2023-06-16 DIAGNOSIS — M4804 Spinal stenosis, thoracic region: Secondary | ICD-10-CM | POA: Diagnosis not present

## 2023-06-17 ENCOUNTER — Telehealth: Payer: Self-pay

## 2023-06-17 ENCOUNTER — Other Ambulatory Visit: Payer: Self-pay | Admitting: Neurosurgery

## 2023-06-17 DIAGNOSIS — M4804 Spinal stenosis, thoracic region: Secondary | ICD-10-CM | POA: Diagnosis not present

## 2023-06-17 NOTE — Telephone Encounter (Signed)
Pre-operative Risk Assessment    Patient Name: Jennifer Russo  DOB: 1941/06/26 MRN: [id number]  Last ov:05/04/2023  Upcoming visit:Unknown        Request for Surgical Clearance    Procedure:   T11-12 Laminectomy  Date of Surgery:  Clearance 06/28/23                                 Surgeon:  Sherilyn Cooter A. Pool  Surgeon's Group or Practice Name:  [facility]  Phone number:  [phone] Fax number:  [phone]   Type of Clearance Requested:   - Medical  - Pharmacy:  Hold Clopidogrel (Plavix) Not indicated    Type of Anesthesia:  General    Additional requests/questions:    [name]   06/17/2023, 5:11 PM

## 2023-06-18 NOTE — Telephone Encounter (Signed)
Reviewed my recent office note.  The patient is clinically stable with normal LV function, mild to moderate valvular disease, well compensated, and no symptoms of angina.  She can proceed with laminectomy at low risk of cardiac complication.  It is okay from my perspective for her to hold Plavix 5 to 7 days before surgery as needed.

## 2023-06-18 NOTE — Telephone Encounter (Signed)
Dr. Excell Seltzer,  You saw this patient on 05/04/2023. Per office protocol, will you please comment on medical clearance for T11-12 laminectomy scheduled for 06/28/2023? The request includes holding Plavix prior to procedure.   Please route your response to P CV DIV Preop. I will communicate with requesting office once you have given recommendations.   Thank you!  Carlos Levering, NP

## 2023-06-18 NOTE — Telephone Encounter (Signed)
   Name: Jennifer Russo  DOB: 06-22-1941  MRN: [id number]   Primary Cardiologist: Tonny Bollman, MD  Chart reviewed as part of pre-operative protocol coverage. SHIZUE KASEMAN was last seen on 05/04/2023 by Dr. Excell Seltzer.  Per Dr. Excell Seltzer "The patient is clinically stable with normal LV function, mild to moderate valvular disease, well compensated, and no symptoms of angina. She can proceed with laminectomy at low risk of cardiac complication. It is okay from my perspective for her to hold Plavix 5 to 7 days before surgery as needed."   Therefore, based on ACC/AHA guidelines, the patient would be at acceptable risk for the planned procedure without further cardiovascular testing.   Per office protocol, he may hold Plavix for 5-7 days prior to procedure and should resume as soon as hemodynamically stable postoperatively.   I will route this recommendation to the requesting party via Epic fax function and remove from pre-op pool. Please call with questions.  Carlos Levering, NP 06/18/2023, 2:31 PM

## 2023-06-22 NOTE — Progress Notes (Signed)
Surgical Instructions   Your procedure is scheduled on Monday, November 11th, 2024. Report to [facility] Main Entrance "A" at 10:00 A.M., then check in with the Admitting office. Any questions or running late day of surgery: call [phone]  Questions prior to your surgery date: call [phone], Monday-Friday, 8am-4pm. If you experience any cold or flu symptoms such as cough, fever, chills, shortness of breath, etc. between now and your scheduled surgery, please notify us at the above number.     Remember:  Do not eat or drink after midnight the night before your surgery    Take these medicines the morning of surgery with A SIP OF WATER: Amlodipine (Norvasc), Levothyroxine (Synthroid), Metoprolol (Lopressor), Rosuvastatin (Crestor)   May take these medicines IF NEEDED: eye drops  Follow your surgeons instructions on when to stop taking Plavix.  One week prior to surgery, STOP taking any Aspirin (unless otherwise instructed by your surgeon) Aleve, Naproxen, Ibuprofen, Motrin, Advil, Goody's, BC's, all herbal medications, fish oil, and non-prescription vitamins.                     Do NOT Smoke (Tobacco/Vaping) for 24 hours prior to your procedure.  If you use a CPAP at night, you may bring your mask/headgear for your overnight stay.   You will be asked to remove any contacts, glasses, piercing's, hearing aid's, dentures/partials prior to surgery. Please bring cases for these items if needed.    Patients discharged the day of surgery will not be allowed to drive home, and someone needs to stay with them for 24 hours.  SURGICAL WAITING ROOM VISITATION Patients may have no more than 2 support people in the waiting area - these visitors may rotate.   Pre-op nurse will coordinate an appropriate time for 1 ADULT support person, who may not rotate, to accompany patient in pre-op.  Children under the age of 72 must have an adult with them who is not the patient and must remain in the  main waiting area with an adult.  If the patient needs to stay at the hospital during part of their recovery, the visitor guidelines for inpatient rooms apply.  Please refer to the [facility] website for the visitor guidelines for any additional information.   If you received a COVID test during your pre-op visit  it is requested that you wear a mask when out in public, stay away from anyone that may not be feeling well and notify your surgeon if you develop symptoms. If you have been in contact with anyone that has tested positive in the last 10 days please notify you surgeon.      Pre-operative 5 CHG Bathing Instructions   You can play a key role in reducing the risk of infection after surgery. Your skin needs to be as free of germs as possible. You can reduce the number of germs on your skin by washing with CHG (chlorhexidine gluconate) soap before surgery. CHG is an antiseptic soap that kills germs and continues to kill germs even after washing.   DO NOT use if you have an allergy to chlorhexidine/CHG or antibacterial soaps. If your skin becomes reddened or irritated, stop using the CHG and notify one of our RNs at [phone].   Please shower with the CHG soap starting 4 days before surgery using the following schedule:     Please keep in mind the following:  DO NOT shave, including legs and underarms, starting the day of your first shower.  You may shave your face at any point before/day of surgery.  Place clean sheets on your bed the day you start using CHG soap. Use a clean washcloth (not used since being washed) for each shower. DO NOT sleep with pets once you start using the CHG.   CHG Shower Instructions:  Wash your face and private area with normal soap. If you choose to wash your hair, wash first with your normal shampoo.  After you use shampoo/soap, rinse your hair and body thoroughly to remove shampoo/soap residue.  Turn the water OFF and apply about 3 tablespoons  (45 ml) of CHG soap to a CLEAN washcloth.  Apply CHG soap ONLY FROM YOUR NECK DOWN TO YOUR TOES (washing for 3-5 minutes)  DO NOT use CHG soap on face, private areas, open wounds, or sores.  Pay special attention to the area where your surgery is being performed.  If you are having back surgery, having someone wash your back for you may be helpful. Wait 2 minutes after CHG soap is applied, then you may rinse off the CHG soap.  Pat dry with a clean towel  Put on clean clothes/pajamas   If you choose to wear lotion, please use ONLY the CHG-compatible lotions on the back of this paper.   Additional instructions for the day of surgery: DO NOT APPLY any lotions, deodorants, cologne, or perfumes.   Do not bring valuables to the hospital. [facility] is not responsible for any belongings/valuables. Do not wear nail polish, gel polish, artificial nails, or any other type of covering on natural nails (fingers and toes) Do not wear jewelry or makeup Put on clean/comfortable clothes.  Please brush your teeth.  Ask your nurse before applying any prescription medications to the skin.     CHG Compatible Lotions   Aveeno Moisturizing lotion  Cetaphil Moisturizing Cream  Cetaphil Moisturizing Lotion  Clairol Herbal Essence Moisturizing Lotion, Dry Skin  Clairol Herbal Essence Moisturizing Lotion, Extra Dry Skin  Clairol Herbal Essence Moisturizing Lotion, Normal Skin  Curel Age Defying Therapeutic Moisturizing Lotion with Alpha Hydroxy  Curel Extreme Care Body Lotion  Curel Soothing Hands Moisturizing Hand Lotion  Curel Therapeutic Moisturizing Cream, Fragrance-Free  Curel Therapeutic Moisturizing Lotion, Fragrance-Free  Curel Therapeutic Moisturizing Lotion, Original Formula  Eucerin Daily Replenishing Lotion  Eucerin Dry Skin Therapy Plus Alpha Hydroxy Crme  Eucerin Dry Skin Therapy Plus Alpha Hydroxy Lotion  Eucerin Original Crme  Eucerin Original Lotion  Eucerin Plus Crme Eucerin Plus  Lotion  Eucerin TriLipid Replenishing Lotion  Keri Anti-Bacterial Hand Lotion  Keri Deep Conditioning Original Lotion Dry Skin Formula Softly Scented  Keri Deep Conditioning Original Lotion, Fragrance Free Sensitive Skin Formula  Keri Lotion Fast Absorbing Fragrance Free Sensitive Skin Formula  Keri Lotion Fast Absorbing Softly Scented Dry Skin Formula  Keri Original Lotion  Keri Skin Renewal Lotion Keri Silky Smooth Lotion  Keri Silky Smooth Sensitive Skin Lotion  Nivea Body Creamy Conditioning Oil  Nivea Body Extra Enriched Lotion  Nivea Body Original Lotion  Nivea Body Sheer Moisturizing Lotion Nivea Crme  Nivea Skin Firming Lotion  NutraDerm 30 Skin Lotion  NutraDerm Skin Lotion  NutraDerm Therapeutic Skin Cream  NutraDerm Therapeutic Skin Lotion  ProShield Protective Hand Cream  Provon moisturizing lotion  Please read over the following fact sheets that you were given.

## 2023-06-23 ENCOUNTER — Encounter (HOSPITAL_COMMUNITY)
Admission: RE | Admit: 2023-06-23 | Discharge: 2023-06-23 | Disposition: A | Discharge status: Home / Self Care | Payer: Medicare Other | Source: Ambulatory Visit | Attending: Neurosurgery | Admitting: Neurosurgery

## 2023-06-23 ENCOUNTER — Encounter (HOSPITAL_COMMUNITY): Payer: Self-pay

## 2023-06-23 ENCOUNTER — Other Ambulatory Visit: Payer: Self-pay

## 2023-06-23 VITALS — BP 120/46 | HR 58 | Temp 97.8°F | Resp 17 | Ht 63.0 in | Wt 119.6 lb

## 2023-06-23 DIAGNOSIS — E785 Hyperlipidemia, unspecified: Secondary | ICD-10-CM | POA: Diagnosis not present

## 2023-06-23 DIAGNOSIS — E039 Hypothyroidism, unspecified: Secondary | ICD-10-CM | POA: Diagnosis not present

## 2023-06-23 DIAGNOSIS — Z955 Presence of coronary angioplasty implant and graft: Secondary | ICD-10-CM | POA: Insufficient documentation

## 2023-06-23 DIAGNOSIS — Z8673 Personal history of transient ischemic attack (TIA), and cerebral infarction without residual deficits: Secondary | ICD-10-CM | POA: Diagnosis not present

## 2023-06-23 DIAGNOSIS — I129 Hypertensive chronic kidney disease with stage 1 through stage 4 chronic kidney disease, or unspecified chronic kidney disease: Secondary | ICD-10-CM | POA: Insufficient documentation

## 2023-06-23 DIAGNOSIS — Z01818 Encounter for other preprocedural examination: Secondary | ICD-10-CM

## 2023-06-23 DIAGNOSIS — N1831 Chronic kidney disease, stage 3a: Secondary | ICD-10-CM | POA: Insufficient documentation

## 2023-06-23 DIAGNOSIS — Z01812 Encounter for preprocedural laboratory examination: Secondary | ICD-10-CM | POA: Diagnosis not present

## 2023-06-23 DIAGNOSIS — I2510 Atherosclerotic heart disease of native coronary artery without angina pectoris: Secondary | ICD-10-CM | POA: Insufficient documentation

## 2023-06-23 DIAGNOSIS — I350 Nonrheumatic aortic (valve) stenosis: Secondary | ICD-10-CM | POA: Diagnosis not present

## 2023-06-23 DIAGNOSIS — Z7902 Long term (current) use of antithrombotics/antiplatelets: Secondary | ICD-10-CM | POA: Insufficient documentation

## 2023-06-23 DIAGNOSIS — I10 Essential (primary) hypertension: Secondary | ICD-10-CM

## 2023-06-23 HISTORY — DX: Unspecified macular degeneration: H35.30

## 2023-06-23 HISTORY — DX: Cerebral infarction, unspecified: I63.9

## 2023-06-23 LAB — CBC
HCT: 37.5 % (ref 36.0–46.0)
Hemoglobin: 13 g/dL (ref 12.0–15.0)
MCH: 32.5 pg (ref 26.0–34.0)
MCHC: 34.7 g/dL (ref 30.0–36.0)
MCV: 93.8 fL (ref 80.0–100.0)
Platelets: 288 10*3/uL (ref 150–400)
RBC: 4 MIL/uL (ref 3.87–5.11)
RDW: 12.5 % (ref 11.5–15.5)
WBC: 10.9 10*3/uL — ABNORMAL HIGH (ref 4.0–10.5)
nRBC: 0 % (ref 0.0–0.2)

## 2023-06-23 LAB — BASIC METABOLIC PANEL
Anion gap: 12 (ref 5–15)
BUN: 36 mg/dL — ABNORMAL HIGH (ref 8–23)
CO2: 22 mmol/L (ref 22–32)
Calcium: 9.9 mg/dL (ref 8.9–10.3)
Chloride: 94 mmol/L — ABNORMAL LOW (ref 98–111)
Creatinine, Ser: 1.46 mg/dL — ABNORMAL HIGH (ref 0.44–1.00)
GFR, Estimated: 35 mL/min — ABNORMAL LOW (ref >60)
Glucose, Bld: 90 mg/dL (ref 70–99)
Potassium: 5.4 mmol/L — ABNORMAL HIGH (ref 3.5–5.1)
Sodium: 128 mmol/L — ABNORMAL LOW (ref 135–145)

## 2023-06-23 LAB — SURGICAL PCR SCREEN
MRSA, PCR: NEGATIVE
Staphylococcus aureus: NEGATIVE

## 2023-06-23 NOTE — Progress Notes (Signed)
PCP - Dr. Merri Brunette Cardiologist - Dr. Tonny Bollman  PPM/ICD - denies   Chest x-ray - 06/28/08 EKG - 05/04/23- not released- another on 2/24 Stress Test - 03/16/11 ECHO - 09/18/22 Cardiac Cath - 2006  Sleep Study - denies   DM- denies  Last dose of GLP1 agonist-  n/a   Blood Thinner Instructions: Hold Plavix 5-7 days. Pt took last dose on 11/2 Aspirin Instructions: n/a  ERAS Protcol - no, NPO   COVID TEST- n/a   Anesthesia review: yes, cardiac hx  Patient denies shortness of breath, fever, cough and chest pain at PAT appointment   All instructions explained to the patient, with a verbal understanding of the material. Patient agrees to go over the instructions while at home for a better understanding. The opportunity to ask questions was provided.

## 2023-06-24 NOTE — Anesthesia Preprocedure Evaluation (Addendum)
Anesthesia Evaluation  Patient identified by MRN, date of birth, ID band Patient awake    Reviewed: Allergy & Precautions, NPO status , Patient's Chart, lab work & pertinent test results  Airway Mallampati: II  TM Distance: >3 FB Neck ROM: Full    Dental no notable dental hx.    Pulmonary neg pulmonary ROS   Pulmonary exam normal        Cardiovascular hypertension, Pt. on medications and Pt. on home beta blockers + CAD and + Cardiac Stents   Rhythm:Regular Rate:Normal     Neuro/Psych CVA, No Residual Symptoms  negative psych ROS   GI/Hepatic negative GI ROS, Neg liver ROS,,,  Endo/Other  Hypothyroidism    Renal/GU   negative genitourinary   Musculoskeletal  (+) Arthritis ,    Abdominal Normal abdominal exam  (+)   Peds  Hematology Lab Results      Component                Value               Date                      WBC                      10.9 (H)            06/23/2023                HGB                      13.3                06/28/2023                HCT                      39.0                06/28/2023                MCV                      93.8                06/23/2023                PLT                      288                 06/23/2023             Lab Results      Component                Value               Date                      NA                       132 (L)             06/28/2023                K  5.0                 06/28/2023                CO2                      22                  06/23/2023                GLUCOSE                  99                  06/28/2023                BUN                      44 (H)              06/28/2023                CREATININE               1.70 (H)            06/28/2023                CALCIUM                  9.9                 06/23/2023                GFRNONAA                 35 (L)              06/23/2023               Anesthesia Other Findings   Reproductive/Obstetrics                             Anesthesia Physical Anesthesia Plan  ASA: 3  Anesthesia Plan: General   Post-op Pain Management:    Induction: Intravenous  PONV Risk Score and Plan: Ondansetron and Dexamethasone  Airway Management Planned: Oral ETT  Additional Equipment:   Intra-op Plan:   Post-operative Plan: Extubation in OR  Informed Consent: I have reviewed the patients History and Physical, chart, labs and discussed the procedure including the risks, benefits and alternatives for the proposed anesthesia with the patient or authorized representative who has indicated his/her understanding and acceptance.     Dental advisory given  Plan Discussed with: CRNA  Anesthesia Plan Comments: (PAT note by Antionette Poles, PA-C: 82 year old female follows with cardiology for history of CAD s/p remote stenting of the LAD and right coronary, carotid stenosis, HLD, mild aortic stenosis.  Cardiac clearance per telephone encounter 06/18/2023, "Chart reviewed as part of pre-operative protocol coverage. IDALIZ PENNACCHIO was last seen on 05/04/2023 by Dr. Excell Seltzer.  Per Dr. Excell Seltzer "The patient is clinically stable with normal LV function, mild to moderate valvular disease, well compensated, and no symptoms of angina. She can proceed with laminectomy at low risk of cardiac complication. It is okay from my perspective for her to hold Plavix 5 to 7 days before surgery as needed."  Therefore, based on ACC/AHA guidelines, the patient would be at acceptable risk  for the planned procedure without further cardiovascular testing. Per office protocol, he may hold Plavix for 5-7 days prior to procedure and should resume as soon as hemodynamically stable postoperatively."  Other pertinent history includes CKD 3A, remote CVA, hypothyroid, hyponatremia.  Preop labs reviewed, moderate hyponatremia sodium 128 (appears chronic, sodium 132 on  11/05/2022), mild hyperkalemia potassium 5.4, creatinine mildly elevated 1.46 (similar to most recent comparison of 1.41 on 11/05/2022).  Will get i-STAT on day of surgery due to sodium <130.  EKG 10/13/2022: Sinus bradycardia.  Rate 50.  Voltage criteria for LVH  Carotid US 08/2022: Summary:  Right Carotid: Velocities in the right ICA are consistent with a 40-59% st  based         on PSV and plaque morphology.   Left Carotid: Velocities in the left ICA are consistent with a 1-39%  stenosis.   Vertebrals:Bilateral vertebral arteries demonstrate antegrade flow.  Subclavians: Normal flow hemodynamics were seen in bilateral subclavian        arteries.      Echo 09/2022: 1. AI appears worse compared to previous study 06/02/19 (at least  moderate; best visualized on apical 3 chamber view).  2. Left ventricular ejection fraction, by estimation, is 60 to 65%. The  left ventricle has normal function. The left ventricle has no regional  wall motion abnormalities. Left ventricular diastolic parameters are  consistent with Grade I diastolic  dysfunction (impaired relaxation).  3. Right ventricular systolic function is normal. The right ventricular  size is normal.  4. The mitral valve is normal in structure. Trivial mitral valve  regurgitation. No evidence of mitral stenosis.  5. The aortic valve is calcified. Aortic valve regurgitation is moderate.  Mild aortic valve stenosis.  6. The inferior vena cava is normal in size with greater than 50%  respiratory variability, suggesting right atrial pressure of 3 mmHg.     )        Anesthesia Quick Evaluation

## 2023-06-24 NOTE — Progress Notes (Signed)
Anesthesia Chart Review:  82 year old female follows with cardiology for history of CAD s/p remote stenting of the LAD and right coronary, carotid stenosis, HLD, mild aortic stenosis.  Cardiac clearance per telephone encounter 06/18/2023, "Chart reviewed as part of pre-operative protocol coverage. Jennifer Russo was last seen on 05/04/2023 by Dr. Excell Seltzer.  Per Dr. Excell Seltzer "The patient is clinically stable with normal LV function, mild to moderate valvular disease, well compensated, and no symptoms of angina. She can proceed with laminectomy at low risk of cardiac complication. It is okay from my perspective for her to hold Plavix 5 to 7 days before surgery as needed."  Therefore, based on ACC/AHA guidelines, the patient would be at acceptable risk for the planned procedure without further cardiovascular testing. Per office protocol, he may hold Plavix for 5-7 days prior to procedure and should resume as soon as hemodynamically stable postoperatively."  Other pertinent history includes CKD 3A, remote CVA, hypothyroid, hyponatremia.  Preop labs reviewed, moderate hyponatremia sodium 128 (appears chronic, sodium 132 on 11/05/2022), mild hyperkalemia potassium 5.4, creatinine mildly elevated 1.46 (similar to most recent comparison of 1.41 on 11/05/2022).  Will get i-STAT on day of surgery due to sodium <130.  EKG 10/13/2022: Sinus bradycardia.  Rate 50.  Voltage criteria for LVH  Carotid US 08/2022: Summary:  Right Carotid: Velocities in the right ICA are consistent with a 40-59% st  based                on PSV and plaque morphology.   Left Carotid: Velocities in the left ICA are consistent with a 1-39%  stenosis.   Vertebrals: Bilateral vertebral arteries demonstrate antegrade flow.  Subclavians: Normal flow hemodynamics were seen in bilateral subclavian               arteries.      Echo 09/2022: 1. AI appears worse compared to previous study 06/02/19 (at least  moderate; best visualized on apical 3  chamber view).   2. Left ventricular ejection fraction, by estimation, is 60 to 65%. The  left ventricle has normal function. The left ventricle has no regional  wall motion abnormalities. Left ventricular diastolic parameters are  consistent with Grade I diastolic  dysfunction (impaired relaxation).   3. Right ventricular systolic function is normal. The right ventricular  size is normal.   4. The mitral valve is normal in structure. Trivial mitral valve  regurgitation. No evidence of mitral stenosis.   5. The aortic valve is calcified. Aortic valve regurgitation is moderate.  Mild aortic valve stenosis.   6. The inferior vena cava is normal in size with greater than 50%  respiratory variability, suggesting right atrial pressure of 3 mmHg.     Zannie Cove [facility]/Anesthesiology Phone [phone] 06/24/2023 12:13 PM

## 2023-06-25 NOTE — Progress Notes (Signed)
Patient was called to be informed that the surgery time for Monday was changed. Patient was not available and this Clinical research associate called patient's son, Mr. Maye, [place]. Son was informed that the surgery time for Monday was changed to 11:30 o'clock and his mother must be at the hospital Monday morning at 09:30 o'clock. Son verbalized understanding.

## 2023-06-28 ENCOUNTER — Ambulatory Visit (HOSPITAL_BASED_OUTPATIENT_CLINIC_OR_DEPARTMENT_OTHER): Payer: Medicare Other | Admitting: Physician Assistant

## 2023-06-28 ENCOUNTER — Other Ambulatory Visit: Payer: Self-pay

## 2023-06-28 ENCOUNTER — Ambulatory Visit (HOSPITAL_COMMUNITY)
Admission: RE | Admit: 2023-06-28 | Discharge: 2023-06-28 | Disposition: A | Discharge status: Home / Self Care | Payer: Medicare Other | Source: Ambulatory Visit | Attending: Neurosurgery | Admitting: Neurosurgery

## 2023-06-28 ENCOUNTER — Ambulatory Visit (HOSPITAL_COMMUNITY)
Admission: RE | Disposition: A | Discharge status: Home / Self Care | Payer: Self-pay | Source: Home / Self Care | Attending: Neurosurgery

## 2023-06-28 ENCOUNTER — Encounter (HOSPITAL_COMMUNITY): Payer: Self-pay | Admitting: Neurosurgery

## 2023-06-28 ENCOUNTER — Observation Stay (HOSPITAL_COMMUNITY)
Admission: RE | Admit: 2023-06-28 | Discharge: 2023-06-29 | Disposition: A | Discharge status: Home / Self Care | Payer: Medicare Other | Attending: Neurosurgery | Admitting: Neurosurgery

## 2023-06-28 ENCOUNTER — Ambulatory Visit (HOSPITAL_COMMUNITY): Payer: Medicare Other | Admitting: Physician Assistant

## 2023-06-28 ENCOUNTER — Other Ambulatory Visit (HOSPITAL_COMMUNITY): Payer: Self-pay | Admitting: Neurosurgery

## 2023-06-28 DIAGNOSIS — Z8673 Personal history of transient ischemic attack (TIA), and cerebral infarction without residual deficits: Secondary | ICD-10-CM | POA: Insufficient documentation

## 2023-06-28 DIAGNOSIS — I2510 Atherosclerotic heart disease of native coronary artery without angina pectoris: Secondary | ICD-10-CM | POA: Diagnosis not present

## 2023-06-28 DIAGNOSIS — G959 Disease of spinal cord, unspecified: Secondary | ICD-10-CM | POA: Diagnosis not present

## 2023-06-28 DIAGNOSIS — Z79899 Other long term (current) drug therapy: Secondary | ICD-10-CM | POA: Diagnosis not present

## 2023-06-28 DIAGNOSIS — Z955 Presence of coronary angioplasty implant and graft: Secondary | ICD-10-CM | POA: Insufficient documentation

## 2023-06-28 DIAGNOSIS — Z7902 Long term (current) use of antithrombotics/antiplatelets: Secondary | ICD-10-CM | POA: Diagnosis not present

## 2023-06-28 DIAGNOSIS — Z419 Encounter for procedure for purposes other than remedying health state, unspecified: Secondary | ICD-10-CM

## 2023-06-28 DIAGNOSIS — M4804 Spinal stenosis, thoracic region: Secondary | ICD-10-CM | POA: Diagnosis not present

## 2023-06-28 DIAGNOSIS — M5414 Radiculopathy, thoracic region: Secondary | ICD-10-CM

## 2023-06-28 DIAGNOSIS — M4854XA Collapsed vertebra, not elsewhere classified, thoracic region, initial encounter for fracture: Secondary | ICD-10-CM | POA: Diagnosis not present

## 2023-06-28 DIAGNOSIS — E039 Hypothyroidism, unspecified: Secondary | ICD-10-CM | POA: Insufficient documentation

## 2023-06-28 DIAGNOSIS — R2689 Other abnormalities of gait and mobility: Secondary | ICD-10-CM | POA: Insufficient documentation

## 2023-06-28 DIAGNOSIS — I10 Essential (primary) hypertension: Secondary | ICD-10-CM | POA: Diagnosis not present

## 2023-06-28 DIAGNOSIS — M5104 Intervertebral disc disorders with myelopathy, thoracic region: Secondary | ICD-10-CM | POA: Diagnosis not present

## 2023-06-28 HISTORY — PX: THORACIC DISCECTOMY: SHX6113

## 2023-06-28 LAB — POCT I-STAT, CHEM 8
BUN: 44 mg/dL — ABNORMAL HIGH (ref 8–23)
Calcium, Ion: 1.15 mmol/L (ref 1.15–1.40)
Chloride: 103 mmol/L (ref 98–111)
Creatinine, Ser: 1.7 mg/dL — ABNORMAL HIGH (ref 0.44–1.00)
Glucose, Bld: 99 mg/dL (ref 70–99)
HCT: 39 % (ref 36.0–46.0)
Hemoglobin: 13.3 g/dL (ref 12.0–15.0)
Potassium: 5 mmol/L (ref 3.5–5.1)
Sodium: 132 mmol/L — ABNORMAL LOW (ref 135–145)
TCO2: 21 mmol/L — ABNORMAL LOW (ref 22–32)

## 2023-06-28 SURGERY — THORACIC DISCECTOMY
Anesthesia: General | Site: Back

## 2023-06-28 MED ORDER — HYDROCODONE-ACETAMINOPHEN 10-325 MG PO TABS
2.0000 | ORAL_TABLET | ORAL | Status: DC | PRN
Start: 2023-06-28 — End: 2023-06-28

## 2023-06-28 MED ORDER — ONDANSETRON HCL 4 MG PO TABS: 4.0000 mg | ORAL_TABLET | Freq: Four times a day (QID) | ORAL | Status: DC | PRN

## 2023-06-28 MED ORDER — TELMISARTAN-HCTZ 80-25 MG PO TABS: 1.0000 | ORAL_TABLET | Freq: Every day | ORAL | Status: DC

## 2023-06-28 MED ORDER — BUPIVACAINE HCL (PF) 0.25 % IJ SOLN
INTRAMUSCULAR | Status: AC
  Filled 2023-06-28: qty 30

## 2023-06-28 MED ORDER — MENTHOL 3 MG MT LOZG
1.0000 | LOZENGE | OROMUCOSAL | Status: DC | PRN
Start: 2023-06-28 — End: 2023-06-29

## 2023-06-28 MED ORDER — ROSUVASTATIN CALCIUM 20 MG PO TABS: 20.0000 mg | ORAL_TABLET | Freq: Every day | ORAL | Status: DC

## 2023-06-28 MED ORDER — HYDROMORPHONE HCL 1 MG/ML IJ SOLN: 1.0000 mg | INTRAMUSCULAR | Status: DC | PRN

## 2023-06-28 MED ORDER — ONDANSETRON HCL 4 MG/2ML IJ SOLN
INTRAMUSCULAR | Status: DC | PRN
  Administered 2023-06-28: 4 mg via INTRAVENOUS

## 2023-06-28 MED ORDER — LIDOCAINE HCL (CARDIAC) PF 100 MG/5ML IV SOSY
PREFILLED_SYRINGE | INTRAVENOUS | Status: DC | PRN
  Administered 2023-06-28: 50 mg via INTRATRACHEAL

## 2023-06-28 MED ORDER — SODIUM CHLORIDE 0.9% FLUSH
3.0000 mL | Freq: Two times a day (BID) | INTRAVENOUS | Status: DC
  Administered 2023-06-28 (×2): 3 mL via INTRAVENOUS

## 2023-06-28 MED ORDER — PROPOFOL 10 MG/ML IV BOLUS
INTRAVENOUS | Status: DC | PRN
  Administered 2023-06-28: 120 mg via INTRAVENOUS

## 2023-06-28 MED ORDER — HYDROCODONE-ACETAMINOPHEN 10-325 MG PO TABS: 1.0000 | ORAL_TABLET | ORAL | Status: DC | PRN

## 2023-06-28 MED ORDER — HYDROCHLOROTHIAZIDE 25 MG PO TABS
25.0000 mg | ORAL_TABLET | Freq: Every day | ORAL | Status: DC
Start: 2023-06-29 — End: 2023-06-29

## 2023-06-28 MED ORDER — EPHEDRINE 5 MG/ML INJ
INTRAVENOUS | Status: AC
  Filled 2023-06-28: qty 5

## 2023-06-28 MED ORDER — CEFAZOLIN SODIUM-DEXTROSE 1-4 GM/50ML-% IV SOLN
1.0000 g | Freq: Three times a day (TID) | INTRAVENOUS | Status: AC
Start: 2023-06-28 — End: 2023-06-29
  Administered 2023-06-28 – 2023-06-29 (×2): 1 g via INTRAVENOUS
  Filled 2023-06-28 (×2): qty 50

## 2023-06-28 MED ORDER — POLYVINYL ALCOHOL 1.4 % OP SOLN: 1.0000 [drp] | Freq: Three times a day (TID) | OPHTHALMIC | Status: DC | PRN

## 2023-06-28 MED ORDER — CHLORHEXIDINE GLUCONATE 0.12 % MT SOLN
15.0000 mL | Freq: Once | OROMUCOSAL | Status: AC
  Administered 2023-06-28: 15 mL via OROMUCOSAL
  Filled 2023-06-28: qty 15

## 2023-06-28 MED ORDER — SPIRONOLACTONE 25 MG PO TABS
50.0000 mg | ORAL_TABLET | Freq: Every day | ORAL | Status: DC
  Administered 2023-06-28: 50 mg via ORAL
  Filled 2023-06-28: qty 2

## 2023-06-28 MED ORDER — 0.9 % SODIUM CHLORIDE (POUR BTL) OPTIME
TOPICAL | Status: DC | PRN
  Administered 2023-06-28: 1000 mL

## 2023-06-28 MED ORDER — LEVOTHYROXINE SODIUM 50 MCG PO TABS
50.0000 ug | ORAL_TABLET | Freq: Every day | ORAL | Status: DC
  Administered 2023-06-29: 50 ug via ORAL
  Filled 2023-06-28: qty 1
  Filled 2023-06-28: qty 2

## 2023-06-28 MED ORDER — SODIUM CHLORIDE 0.9 % IV SOLN
250.0000 mL | INTRAVENOUS | Status: DC
  Administered 2023-06-28: 250 mL via INTRAVENOUS

## 2023-06-28 MED ORDER — ONDANSETRON HCL 4 MG/2ML IJ SOLN
INTRAMUSCULAR | Status: AC
  Filled 2023-06-28: qty 2

## 2023-06-28 MED ORDER — ADULT MULTIVITAMIN W/MINERALS CH
1.0000 | ORAL_TABLET | Freq: Every day | ORAL | Status: DC
  Administered 2023-06-28: 1 via ORAL
  Filled 2023-06-28: qty 1

## 2023-06-28 MED ORDER — AMISULPRIDE (ANTIEMETIC) 5 MG/2ML IV SOLN: 10.0000 mg | Freq: Once | INTRAVENOUS | Status: DC | PRN

## 2023-06-28 MED ORDER — HYDROCODONE-ACETAMINOPHEN 5-325 MG PO TABS
1.0000 | ORAL_TABLET | ORAL | Status: DC | PRN
  Administered 2023-06-28: 1 via ORAL
  Filled 2023-06-28: qty 1

## 2023-06-28 MED ORDER — THROMBIN (RECOMBINANT) 5000 UNITS EX SOLR
CUTANEOUS | Status: DC | PRN
  Administered 2023-06-28: 10 mL via TOPICAL

## 2023-06-28 MED ORDER — IRBESARTAN 150 MG PO TABS: 300.0000 mg | ORAL_TABLET | Freq: Every day | ORAL | Status: DC

## 2023-06-28 MED ORDER — AMLODIPINE BESYLATE 10 MG PO TABS
10.0000 mg | ORAL_TABLET | Freq: Every day | ORAL | Status: DC
  Administered 2023-06-28: 10 mg via ORAL
  Filled 2023-06-28: qty 1

## 2023-06-28 MED ORDER — DEXAMETHASONE SODIUM PHOSPHATE 4 MG/ML IJ SOLN
INTRAMUSCULAR | Status: DC | PRN
  Administered 2023-06-28: 4 mg via INTRAVENOUS

## 2023-06-28 MED ORDER — EPHEDRINE SULFATE (PRESSORS) 50 MG/ML IJ SOLN
INTRAMUSCULAR | Status: DC | PRN
  Administered 2023-06-28: 10 mg via INTRAVENOUS
  Administered 2023-06-28: 15 mg via INTRAVENOUS
  Administered 2023-06-28: 25 mg via INTRAVENOUS

## 2023-06-28 MED ORDER — CHLORHEXIDINE GLUCONATE CLOTH 2 % EX PADS: 6.0000 | MEDICATED_PAD | Freq: Once | CUTANEOUS | Status: DC

## 2023-06-28 MED ORDER — FENTANYL CITRATE (PF) 250 MCG/5ML IJ SOLN
INTRAMUSCULAR | Status: AC
  Filled 2023-06-28: qty 5

## 2023-06-28 MED ORDER — PHENOL 1.4 % MT LIQD: 1.0000 | OROMUCOSAL | Status: DC | PRN

## 2023-06-28 MED ORDER — FENTANYL CITRATE (PF) 250 MCG/5ML IJ SOLN
INTRAMUSCULAR | Status: DC | PRN
  Administered 2023-06-28: 50 ug via INTRAVENOUS
  Administered 2023-06-28: 100 ug via INTRAVENOUS
  Administered 2023-06-28 (×2): 50 ug via INTRAVENOUS

## 2023-06-28 MED ORDER — VITAMIN C 500 MG PO TABS
1000.0000 mg | ORAL_TABLET | Freq: Every day | ORAL | Status: DC
  Administered 2023-06-28: 1000 mg via ORAL
  Filled 2023-06-28: qty 2

## 2023-06-28 MED ORDER — CYCLOBENZAPRINE HCL 10 MG PO TABS: 10.0000 mg | ORAL_TABLET | Freq: Three times a day (TID) | ORAL | Status: DC | PRN

## 2023-06-28 MED ORDER — ACETAMINOPHEN 325 MG PO TABS: 650.0000 mg | ORAL_TABLET | ORAL | Status: DC | PRN

## 2023-06-28 MED ORDER — ORAL CARE MOUTH RINSE: 15.0000 mL | Freq: Once | OROMUCOSAL | Status: AC

## 2023-06-28 MED ORDER — ONDANSETRON HCL 4 MG/2ML IJ SOLN
4.0000 mg | Freq: Four times a day (QID) | INTRAMUSCULAR | Status: DC | PRN
Start: 2023-06-28 — End: 2023-06-29
  Administered 2023-06-28: 4 mg via INTRAVENOUS
  Filled 2023-06-28: qty 2

## 2023-06-28 MED ORDER — SUGAMMADEX SODIUM 200 MG/2ML IV SOLN
INTRAVENOUS | Status: DC | PRN
  Administered 2023-06-28: 200 mg via INTRAVENOUS

## 2023-06-28 MED ORDER — SODIUM CHLORIDE 0.9% FLUSH: 3.0000 mL | INTRAVENOUS | Status: DC | PRN

## 2023-06-28 MED ORDER — ACETAMINOPHEN 650 MG RE SUPP: 650.0000 mg | RECTAL | Status: DC | PRN

## 2023-06-28 MED ORDER — THROMBIN 5000 UNITS EX SOLR
OROMUCOSAL | Status: DC | PRN
  Administered 2023-06-28: 5 mL via TOPICAL

## 2023-06-28 MED ORDER — KETOROLAC TROMETHAMINE 30 MG/ML IJ SOLN
INTRAMUSCULAR | Status: AC
  Filled 2023-06-28: qty 1

## 2023-06-28 MED ORDER — CEFAZOLIN SODIUM-DEXTROSE 2-4 GM/100ML-% IV SOLN
2.0000 g | INTRAVENOUS | Status: AC
  Administered 2023-06-28: 2 g via INTRAVENOUS
  Filled 2023-06-28: qty 100

## 2023-06-28 MED ORDER — ROCURONIUM BROMIDE 10 MG/ML (PF) SYRINGE
PREFILLED_SYRINGE | INTRAVENOUS | Status: AC
  Filled 2023-06-28: qty 10

## 2023-06-28 MED ORDER — FENTANYL CITRATE (PF) 100 MCG/2ML IJ SOLN: 25.0000 ug | INTRAMUSCULAR | Status: DC | PRN

## 2023-06-28 MED ORDER — THROMBIN 5000 UNITS EX SOLR
CUTANEOUS | Status: AC
  Filled 2023-06-28: qty 5000

## 2023-06-28 MED ORDER — LACTATED RINGERS IV SOLN: INTRAVENOUS | Status: DC

## 2023-06-28 MED ORDER — THROMBIN 5000 UNITS EX SOLR
CUTANEOUS | Status: AC
  Filled 2023-06-28: qty 10000

## 2023-06-28 MED ORDER — BUPIVACAINE HCL (PF) 0.25 % IJ SOLN
INTRAMUSCULAR | Status: DC | PRN
  Administered 2023-06-28: 20 mL

## 2023-06-28 MED ORDER — KETOROLAC TROMETHAMINE 15 MG/ML IJ SOLN
15.0000 mg | Freq: Four times a day (QID) | INTRAMUSCULAR | Status: AC
  Administered 2023-06-28 – 2023-06-29 (×3): 15 mg via INTRAVENOUS
  Filled 2023-06-28 (×3): qty 1

## 2023-06-28 MED ORDER — ACETAMINOPHEN 500 MG PO TABS
1000.0000 mg | ORAL_TABLET | Freq: Once | ORAL | Status: AC
  Administered 2023-06-28: 1000 mg via ORAL
  Filled 2023-06-28: qty 2

## 2023-06-28 MED ORDER — ROCURONIUM BROMIDE 10 MG/ML (PF) SYRINGE
PREFILLED_SYRINGE | INTRAVENOUS | Status: DC | PRN
  Administered 2023-06-28: 50 mg via INTRAVENOUS

## 2023-06-28 MED ORDER — LIDOCAINE 2% (20 MG/ML) 5 ML SYRINGE
INTRAMUSCULAR | Status: AC
  Filled 2023-06-28: qty 5

## 2023-06-28 MED ORDER — KETOROLAC TROMETHAMINE 15 MG/ML IJ SOLN
INTRAMUSCULAR | Status: DC | PRN
  Administered 2023-06-28: 15 mg via INTRAVENOUS

## 2023-06-28 MED ORDER — METOPROLOL TARTRATE 50 MG PO TABS
50.0000 mg | ORAL_TABLET | Freq: Two times a day (BID) | ORAL | Status: DC
  Administered 2023-06-28: 50 mg via ORAL
  Filled 2023-06-28: qty 1

## 2023-06-28 MED ORDER — OMEGA-3-ACID ETHYL ESTERS 1 G PO CAPS
1.0000 g | ORAL_CAPSULE | Freq: Two times a day (BID) | ORAL | Status: DC
  Administered 2023-06-28: 1 g via ORAL
  Filled 2023-06-28 (×2): qty 1

## 2023-06-28 MED ORDER — VITAMIN D 25 MCG (1000 UNIT) PO TABS
2000.0000 [IU] | ORAL_TABLET | Freq: Every day | ORAL | Status: DC
  Administered 2023-06-28: 2000 [IU] via ORAL
  Filled 2023-06-28: qty 2

## 2023-06-28 SURGICAL SUPPLY — 47 items
ADH SKN CLS APL DERMABOND .7 (GAUZE/BANDAGES/DRESSINGS) ×1
APL SKNCLS STERI-STRIP NONHPOA (GAUZE/BANDAGES/DRESSINGS) ×1
BAG COUNTER SPONGE SURGICOUNT (BAG) ×1 IMPLANT
BAG SPNG CNTER NS LX DISP (BAG) ×1
BENZOIN TINCTURE PRP APPL 2/3 (GAUZE/BANDAGES/DRESSINGS) ×1 IMPLANT
BUR CUTTER 7.0 ROUND (BURR) ×1 IMPLANT
BUR MATCHSTICK NEURO 3.0 LAGG (BURR) IMPLANT
CANISTER SUCT 3000ML PPV (MISCELLANEOUS) ×1 IMPLANT
DERMABOND ADVANCED .7 DNX12 (GAUZE/BANDAGES/DRESSINGS) ×1 IMPLANT
DRAPE HALF SHEET 40X57 (DRAPES) IMPLANT
DRAPE LAPAROTOMY 100X72X124 (DRAPES) ×1 IMPLANT
DRAPE MICROSCOPE SLANT 54X150 (MISCELLANEOUS) ×1 IMPLANT
DRAPE SURG 17X23 STRL (DRAPES) ×4 IMPLANT
DRSG OPSITE POSTOP 3X4 (GAUZE/BANDAGES/DRESSINGS) IMPLANT
ELECT REM PT RETURN 9FT ADLT (ELECTROSURGICAL) ×1
ELECTRODE REM PT RTRN 9FT ADLT (ELECTROSURGICAL) ×1 IMPLANT
GAUZE 4X4 16PLY ~~LOC~~+RFID DBL (SPONGE) IMPLANT
GAUZE SPONGE 4X4 12PLY STRL (GAUZE/BANDAGES/DRESSINGS) ×1 IMPLANT
GLOVE BIO SURGEON STRL SZ 6.5 (GLOVE) ×1 IMPLANT
GLOVE BIOGEL PI IND STRL 6.5 (GLOVE) ×1 IMPLANT
GLOVE ECLIPSE 9.0 STRL (GLOVE) ×1 IMPLANT
GOWN STRL REUS W/ TWL LRG LVL3 (GOWN DISPOSABLE) IMPLANT
GOWN STRL REUS W/ TWL XL LVL3 (GOWN DISPOSABLE) ×1 IMPLANT
GOWN STRL REUS W/TWL 2XL LVL3 (GOWN DISPOSABLE) IMPLANT
GOWN STRL REUS W/TWL LRG LVL3 (GOWN DISPOSABLE)
GOWN STRL REUS W/TWL XL LVL3 (GOWN DISPOSABLE) ×2
HEMOSTAT POWDER KIT SURGIFOAM (HEMOSTASIS) IMPLANT
KIT BASIN OR (CUSTOM PROCEDURE TRAY) ×1 IMPLANT
KIT TURNOVER KIT B (KITS) ×1 IMPLANT
NDL HYPO 22X1.5 SAFETY MO (MISCELLANEOUS) ×1 IMPLANT
NDL SPNL 22GX3.5 QUINCKE BK (NEEDLE) ×1 IMPLANT
NEEDLE HYPO 22X1.5 SAFETY MO (MISCELLANEOUS) ×1 IMPLANT
NEEDLE SPNL 22GX3.5 QUINCKE BK (NEEDLE) ×1 IMPLANT
NS IRRIG 1000ML POUR BTL (IV SOLUTION) ×1 IMPLANT
PACK LAMINECTOMY NEURO (CUSTOM PROCEDURE TRAY) ×1 IMPLANT
PACK ORTHO CERVICAL (CUSTOM PROCEDURE TRAY) IMPLANT
PAD ARMBOARD 7.5X6 YLW CONV (MISCELLANEOUS) ×3 IMPLANT
SPIKE FLUID TRANSFER (MISCELLANEOUS) ×1 IMPLANT
SPONGE SURGIFOAM ABS GEL SZ50 (HEMOSTASIS) IMPLANT
STRIP CLOSURE SKIN 1/2X4 (GAUZE/BANDAGES/DRESSINGS) ×1 IMPLANT
SUT VIC AB 0 CT1 18XCR BRD8 (SUTURE) IMPLANT
SUT VIC AB 0 CT1 8-18 (SUTURE) ×1
SUT VIC AB 2-0 CT1 18 (SUTURE) ×1 IMPLANT
SUT VIC AB 3-0 SH 8-18 (SUTURE) ×1 IMPLANT
TOWEL GREEN STERILE (TOWEL DISPOSABLE) ×1 IMPLANT
TOWEL GREEN STERILE FF (TOWEL DISPOSABLE) ×1 IMPLANT
WATER STERILE IRR 1000ML POUR (IV SOLUTION) ×1 IMPLANT

## 2023-06-28 NOTE — Anesthesia Postprocedure Evaluation (Signed)
Anesthesia Post Note  Patient: Jennifer Russo  Procedure(s) Performed: Laminectomy - Thoracic eleven-Thoracic twelve (Back)     Patient location during evaluation: PACU Anesthesia Type: General Level of consciousness: awake and alert Pain management: pain level controlled Vital Signs Assessment: post-procedure vital signs reviewed and stable Respiratory status: spontaneous breathing, nonlabored ventilation, respiratory function stable and patient connected to nasal cannula oxygen Cardiovascular status: blood pressure returned to baseline and stable Postop Assessment: no apparent nausea or vomiting Anesthetic complications: no   There were no known notable events for this encounter.  Last Vitals:  Vitals:   06/28/23 1400 06/28/23 1415  BP: (!) 123/40 (!) 134/42  Pulse: (!) 55 (!) 53  Resp: (!) 9 13  Temp:    SpO2: 98% 99%    Last Pain:  Vitals:   06/28/23 1415  TempSrc:   PainSc: 0-No pain    LLE Motor Response: Purposeful movement;Responds to commands (06/28/23 1415) LLE Sensation: Full sensation (06/28/23 1415) RLE Motor Response: Purposeful movement;Responds to commands (06/28/23 1415) RLE Sensation: Full sensation (06/28/23 1415)      [place]

## 2023-06-28 NOTE — Transfer of Care (Signed)
  Immediate Anesthesia Transfer of Care Note  Patient: Jennifer Russo  Procedure(s) Performed: Laminectomy - Thoracic eleven-Thoracic twelve (Back)  Patient Location: PACU  Anesthesia Type:General  Level of Consciousness: awake, alert , oriented, and drowsy  Airway & Oxygen Therapy: Patient Spontanous Breathing  Post-op Assessment: Report given to RN  Post vital signs: Reviewed and stable  Last Vitals:  Vitals Value Taken Time  BP 135/42 06/28/23 1351  Temp    Pulse 58 06/28/23 1355  Resp 11 06/28/23 1355  SpO2 100 % 06/28/23 1355  Vitals shown include unfiled device data.  Last Pain:  Vitals:   06/28/23 1019  TempSrc:   PainSc: 5       Patients Stated Pain Goal: 3 (06/28/23 1019)  Complications: There were no known notable events for this encounter.

## 2023-06-28 NOTE — Brief Op Note (Signed)
06/28/2023  1:34 PM  PATIENT:  Jennifer Russo  82 y.o. female  PRE-OPERATIVE DIAGNOSIS:  Stenosis  POST-OPERATIVE DIAGNOSIS:  Stenosis  PROCEDURE:  Procedure(s): Laminectomy - Thoracic eleven-Thoracic twelve (N/A)  SURGEON:  Surgeons and Role:    Julio Sicks, MD - Primary  PHYSICIAN ASSISTANT:   ASSISTANTS:    ANESTHESIA:   general  EBL:  50 mL   BLOOD ADMINISTERED:none  DRAINS: none   LOCAL MEDICATIONS USED:  MARCAINE     SPECIMEN:  No Specimen  DISPOSITION OF SPECIMEN:  N/A  COUNTS:  YES  TOURNIQUET:  * No tourniquets in log *  DICTATION: .Dragon Dictation  PLAN OF CARE: Admit for overnight observation  PATIENT DISPOSITION:  PACU - hemodynamically stable.   Delay start of Pharmacological VTE agent (>24hrs) due to surgical blood loss or risk of bleeding: yes

## 2023-06-28 NOTE — Op Note (Signed)
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  Asst. Surgeon: None  Anesthesia: General  Indication: 82 year old female with progressive bilateral lower extremity numbness paresthesias and weakness.  Workup demonstrates evidence of severe multifactorial stenosis at T11-T12 with marked spinal cord signal abnormality.  Patient presents now for decompressive surgery.  Operative note: After induction of anesthesia, patient position prone onto bolsters and appropriately padded.  Patient's lumbar region and thoracic region were prepped and draped sterilely.  Incision made overlying T11-T12.  Dissection performed bilaterally.  Retractor placed.  X-ray taken.  Level confirmed.  Decompressive laminectomy was then performed using Leksell rongeurs, Kerrison rongeurs and high-speed drill to remove the entire lamina of T11 and the superior half of the T12 lamina.  Medial facetectomies at T11-12 were also performed.  Ligament flavum elevated and resected.  Lateral gutters were further undercut to adequately decompress the central canal.  At this point a very thorough decompression had been achieved.  There was no evidence of injury to the thecal sac or nerve roots or spinal cord.  The wound was then irrigated.  Gelfoam was placed topically for hemostasis.  Wound is then closed in layers with Vicryl sutures.  Steri-Strips and sterile dressing were applied.  No apparent complications.  Patient tolerated the procedure well and she returns to the recovery room postop.

## 2023-06-28 NOTE — H&P (Signed)
Jennifer Russo is an 82 y.o. female.   Chief Complaint: Weakness HPI: 82 year old female with progressive bilateral lower extremity numbness paresthesias and weakness with some radiating pain into her anterior thighs bilaterally.  Workup demonstrates evidence of severe spinal stenosis at T11-T12 with spinal cord signal change.  Patient admitted now for thoracic decompressive surgery in hopes of improving her symptoms.  Past Medical History:  Diagnosis Date   Arthritis    Asymptomatic carotid artery stenosis    CAD (coronary artery disease)    s/p PCI- LAD and RCA with drug-eluting stents, PTCA in 2009 to treat in stent restenosis in RCA   Essential hypertension    HLD (hyperlipidemia)    Hypothyroidism    Macular degeneration    Stroke [facility])    when she lived in [place] (2000's)    Past Surgical History:  Procedure Laterality Date   CATARACT EXTRACTION     CORONARY ANGIOPLASTY  03-30-05 / 04-14-05   CORONARY STENT PLACEMENT     FEMORAL ARTERY REPAIR Right 07/03/2008   Groin area   KNEE ARTHROSCOPY Left 11/09/2022   Procedure: Left knee arthroscopy; meniscal debridement;  Surgeon: Ollen Gross, MD;  Location: [facility] ORS;  Service: Orthopedics;  Laterality: Left;   KNEE SURGERY  07/2016    Family History  Problem Relation Age of Onset   Hypertension Mother    Diabetes Father    Social History:  reports that she has never smoked. She has never used smokeless tobacco. She reports that she does not drink alcohol and does not use drugs.  Allergies:  Allergies  Allergen Reactions   Atorvastatin     Unknown reaction    Penicillins Other (See Comments)    Doesn't recall   Sulfonamide Derivatives Other (See Comments)    Doesn't recall   Tramadol     Upset stomach    Medications Prior to Admission  Medication Sig Dispense Refill   amLODipine (NORVASC) 10 MG tablet Take 10 mg by mouth at bedtime.     Ascorbic Acid (VITAMIN C) 1000 MG tablet Take 1,000 mg by mouth daily.      Calcium Carb-Cholecalciferol (CALCIUM 600 + D PO) Take 1 tablet by mouth in the morning and at bedtime.     Cholecalciferol (VITAMIN D) 2000 UNITS CAPS Take 2,000 Units by mouth daily.     levothyroxine (SYNTHROID, LEVOTHROID) 50 MCG tablet Take 50 mcg by mouth daily before breakfast.     metoprolol (LOPRESSOR) 50 MG tablet Take 50 mg by mouth 2 (two) times daily.     Multiple Vitamins-Minerals (MULTIVITAL) tablet Take 1 tablet by mouth daily.     Omega-3 Fatty Acids (FISH OIL) 1000 MG CAPS Take 1,000 mg by mouth daily.     Polyethyl Glycol-Propyl Glycol (SYSTANE OP) Place 1 drop into both eyes 3 (three) times daily as needed (drye eyes).     predniSONE (STERAPRED UNI-PAK 21 TAB) 5 MG (21) TBPK tablet (Typical regimens for 21 tablet dose packs of Methylprednisolone 4mg , Prednisone 5mg , and Prednisone 10mg ) Day 1: 2 tabs before breakfast, 1 tab after lunch, 1 tab after supper, and 2 tabs at bedtime. Day 2: 1 tab before breakfast, 1 tab after lunch, 1 tab after supper, and 2 tabs at bedtime. Day 3: 1 tab before breakfast, 1 tab after lunch, 1 tab after supper, and 1 tab at bedtime. Day 4: 1 tab before breakfast, 1 tab after lunch, and 1 tab at bedtime. Day 5: 1 tab before breakfast and 1 tab  at bedtime. Day 6: 1 tab before breakfast.     rosuvastatin (CRESTOR) 20 MG tablet Take 20 mg by mouth daily.     spironolactone (ALDACTONE) 50 MG tablet Take 50 mg by mouth daily.     telmisartan-hydrochlorothiazide (MICARDIS HCT) 80-25 MG per tablet Take 1 tablet by mouth daily.     clopidogrel (PLAVIX) 75 MG tablet Take 75 mg by mouth daily.      Results for orders placed or performed during the hospital encounter of 06/28/23 (from the past 48 hour(s))  I-STAT, chem 8     Status: Abnormal   Collection Time: 06/28/23 10:30 AM  Result Value Ref Range   Sodium 132 (L) 135 - 145 mmol/L   Potassium 5.0 3.5 - 5.1 mmol/L   Chloride 103 98 - 111 mmol/L   BUN 44 (H) 8 - 23 mg/dL   Creatinine, Ser 3.08 (H)  0.44 - 1.00 mg/dL   Glucose, Bld 99 70 - 99 mg/dL    Comment: Glucose reference range applies only to samples taken after fasting for at least 8 hours.   Calcium, Ion 1.15 1.15 - 1.40 mmol/L   TCO2 21 (L) 22 - 32 mmol/L   Hemoglobin 13.3 12.0 - 15.0 g/dL   HCT 65.7 84.6 - 96.2 %   No results found.  Pertinent items noted in HPI and remainder of comprehensive ROS otherwise negative.  Blood pressure (!) 143/46, pulse (!) 50, temperature 97.9 F (36.6 C), temperature source Oral, resp. rate 16, height 5\' 3"  (1.6 m), weight 52.2 kg, SpO2 98%.  Patient is awake and alert.  She is oriented and appropriate.  Speech is fluent.  Judgment insight are intact.  Cranial nerve function normal bilateral.  Motor examination 5/5 in bilateral upper extremities.  Motor examination her lower extremities 4/5 with increased tone bilaterally.  Sensory examination with decrease sensation pinprick and light touch from T12 distally.  Reflexes are normal active in both upper extremities somewhat increased in both lower extremities.  Toes are upgoing to plantar stimulation.  Examination head ears eyes nose and throat is unremarked.  Chest and abdomen are benign.  Extremities are free from injury deformity. Assessment/Plan T11-T12 stenosis with myelopathy.  Plan T11-T12 decompressive laminectomy.  Risks and benefits been explained.  Patient wishes proceed.  Sherilyn Cooter A Tattianna Schnarr 06/28/2023, 11:36 AM

## 2023-06-29 ENCOUNTER — Encounter (HOSPITAL_COMMUNITY): Payer: Self-pay | Admitting: Neurosurgery

## 2023-06-29 DIAGNOSIS — I2510 Atherosclerotic heart disease of native coronary artery without angina pectoris: Secondary | ICD-10-CM | POA: Diagnosis not present

## 2023-06-29 DIAGNOSIS — Z8673 Personal history of transient ischemic attack (TIA), and cerebral infarction without residual deficits: Secondary | ICD-10-CM | POA: Diagnosis not present

## 2023-06-29 DIAGNOSIS — M4804 Spinal stenosis, thoracic region: Secondary | ICD-10-CM | POA: Diagnosis not present

## 2023-06-29 DIAGNOSIS — E039 Hypothyroidism, unspecified: Secondary | ICD-10-CM | POA: Diagnosis not present

## 2023-06-29 DIAGNOSIS — Z7902 Long term (current) use of antithrombotics/antiplatelets: Secondary | ICD-10-CM | POA: Diagnosis not present

## 2023-06-29 DIAGNOSIS — Z79899 Other long term (current) drug therapy: Secondary | ICD-10-CM | POA: Diagnosis not present

## 2023-06-29 DIAGNOSIS — R2689 Other abnormalities of gait and mobility: Secondary | ICD-10-CM | POA: Diagnosis not present

## 2023-06-29 DIAGNOSIS — Z955 Presence of coronary angioplasty implant and graft: Secondary | ICD-10-CM | POA: Diagnosis not present

## 2023-06-29 DIAGNOSIS — I10 Essential (primary) hypertension: Secondary | ICD-10-CM | POA: Diagnosis not present

## 2023-06-29 DIAGNOSIS — G959 Disease of spinal cord, unspecified: Secondary | ICD-10-CM | POA: Diagnosis not present

## 2023-06-29 MED ORDER — HYDROCODONE-ACETAMINOPHEN 5-325 MG PO TABS: 1.0000 | ORAL_TABLET | ORAL | 0 refills | Status: DC | PRN

## 2023-06-29 MED FILL — Thrombin For Soln 5000 Unit: CUTANEOUS | Qty: 2 | Status: AC

## 2023-06-29 NOTE — Discharge Summary (Signed)
Physician Discharge Summary  Patient ID: Jennifer Russo MRN: [id number] DOB/AGE: 1941-03-01 82 y.o.  Admit date: 06/28/2023 Discharge date: 06/29/2023  Admission Diagnoses:  Discharge Diagnoses:  Principal Problem:   Thoracic spinal stenosis   Discharged Condition: good  Hospital Course: Patient admitted to the hospital where she underwent uncomplicated thoracic decompressive surgery for treatment of her severe stenosis with myelopathy.  Postoperatively doing very well.  Preoperative pain has resolved.  Lower extremity strength and sensation significantly improved.  Ambulating with a walker.  Voiding well.  Ready for discharge home.  Consults:   Significant Diagnostic Studies:   Treatments:   Discharge Exam: Blood pressure (!) 120/94, pulse 67, temperature 98.2 F (36.8 C), temperature source Oral, resp. rate 16, height 5\' 3"  (1.6 m), weight 52.2 kg, SpO2 99%. Awake and alert.  Oriented and appropriate.  Motor examination normal in both upper extremities.  Lower extremity with some slight 4+/5 weakness with increased tone.  Sensory examination normal.  Wound clean and dry.  Chest and abdomen benign.  Disposition: Discharge disposition: 01-Home or Self Care       Discharge Instructions     Face-to-face encounter (required for Medicare/Medicaid patients)   Complete by: As directed    I Temple Pacini certify that this patient is under my care and that I, or a nurse practitioner or physician's assistant working with me, had a face-to-face encounter that meets the physician face-to-face encounter requirements with th is patient on 06/29/2023. The encounter with the patient was in whole, or in part for the following medical condition(s) which is the primary reason for home health care (List medical condition): Thoracic stenosis with myelopathy   The encounter with the patient was in whole, or in part, for the following medical condition, which is the primary reason for home health  care: Thoracic stenosis with myelopathy   I certify that, based on my findings, the following services are medically necessary home health services: Physical therapy   Reason for Medically Necessary Home Health Services: Therapy- Investment banker, operational, Patent examiner   My clinical findings support the need for the above services: Unable to leave home safely without assistance and/or assistive device   Further, I certify that my clinical findings support that this patient is homebound due to: Unable to leave home safely without assistance   Home Health   Complete by: As directed    To provide the following care/treatments:  PT OT        Allergies as of 06/29/2023       Reactions   Atorvastatin    Unknown reaction    Penicillins Other (See Comments)   Doesn't recall   Sulfonamide Derivatives Other (See Comments)   Doesn't recall   Tramadol    Upset stomach        Medication List     TAKE these medications    amLODipine 10 MG tablet Commonly known as: NORVASC Take 10 mg by mouth at bedtime.   CALCIUM 600 + D PO Take 1 tablet by mouth in the morning and at bedtime.   clopidogrel 75 MG tablet Commonly known as: PLAVIX Take 75 mg by mouth daily.   Fish Oil 1000 MG Caps Take 1,000 mg by mouth daily.   HYDROcodone-acetaminophen 5-325 MG tablet Commonly known as: NORCO/VICODIN Take 1 tablet by mouth every 4 (four) hours as needed for moderate pain (pain score 4-6) ((score 4 to 6)).   levothyroxine 50 MCG tablet Commonly known as: SYNTHROID Take  50 mcg by mouth daily before breakfast.   metoprolol tartrate 50 MG tablet Commonly known as: LOPRESSOR Take 50 mg by mouth 2 (two) times daily.   Multivital tablet Take 1 tablet by mouth daily.   predniSONE 5 MG (21) Tbpk tablet Commonly known as: STERAPRED UNI-PAK 21 TAB (Typical regimens for 21 tablet dose packs of Methylprednisolone 4mg , Prednisone 5mg , and Prednisone 10mg ) Day 1: 2 tabs before  breakfast, 1 tab after lunch, 1 tab after supper, and 2 tabs at bedtime. Day 2: 1 tab before breakfast, 1 tab after lunch, 1 tab after supper, and 2 tabs at bedtime. Day 3: 1 tab before breakfast, 1 tab after lunch, 1 tab after supper, and 1 tab at bedtime. Day 4: 1 tab before breakfast, 1 tab after lunch, and 1 tab at bedtime. Day 5: 1 tab before breakfast and 1 tab at bedtime. Day 6: 1 tab before breakfast.   rosuvastatin 20 MG tablet Commonly known as: CRESTOR Take 20 mg by mouth daily.   spironolactone 50 MG tablet Commonly known as: ALDACTONE Take 50 mg by mouth daily.   SYSTANE OP Place 1 drop into both eyes 3 (three) times daily as needed (drye eyes).   telmisartan-hydrochlorothiazide 80-25 MG tablet Commonly known as: MICARDIS HCT Take 1 tablet by mouth daily.   vitamin C 1000 MG tablet Take 1,000 mg by mouth daily.   Vitamin D 50 MCG (2000 UT) Caps Take 2,000 Units by mouth daily.         SignedKathaleen Maser Kayvan Hoefling 06/29/2023, 8:47 AM

## 2023-06-29 NOTE — Progress Notes (Signed)
Patient alert and oriented,void, ambulate. D/c instructions explain and given to the patient all questions answered. Surgical site clean and dry no sign of infection. Pt. D/c home per order.

## 2023-06-29 NOTE — Discharge Instructions (Addendum)
Wound Care Keep incision covered and dry until post op day 3. You may remove the Honeycomb dressing on post op day 3. Leave steri-strips on back.  They will fall off by themselves. Do not put any creams, lotions, or ointments on incision. You are fine to shower. Let water run over incision and pat dry.  Activity Activity Walk each and every day, increasing distance each day. No lifting greater than 8 lbs.  No lifting no bending no twisting no driving or riding a car unless coming back and forth to see the doctor. If provided with back brace, wear when out of bed.  It is not necessary to wear brace in bed.  Diet Resume your normal diet.   Call Your Doctor If Any of These Occur Redness, drainage, or swelling at the wound.  Temperature greater than 101 degrees. Severe pain not relieved by pain medication. Incision starts to come apart.  Follow Up Appt Call [phone] if you have one or any problem.

## 2023-06-29 NOTE — Evaluation (Signed)
Occupational Therapy Evaluation Patient Details Name: Jennifer Russo MRN: [id number] DOB: 03-07-1941 Today's Date: 06/29/2023   History of Present Illness Jennifer EISENHAUER is a 82 yo female who underwent Laminectomy - Thoracic eleven-Thoracic twelve 11/11. PMHx: arthritis, CAD, essential HTN, HLD, hypothyroidism, MD, stroke   Clinical Impression   [name] was evaluated s/p the above spine surgery. She is mod I and lives with her husband at baseline. Upon evaluation pt was limited by spinal precautions, surgical pain, generalized BLE weakness and limited activity tolerance. Overall she mobilized with RW and CGA for mobility and min A for LB ADLs. Provided cues and education on spinal precautions and compensatory techniques throughout, handout provided and pt demonstrated great recall during ADLs and mobility. Pt's son was interpreting and assisting throughout. Pt does not require further acute OT services. Recommend d/c home with support of family.         If plan is discharge home, recommend the following: Assistance with cooking/housework;Assist for transportation;Help with stairs or ramp for entrance    Functional Status Assessment  Patient has had a recent decline in their functional status and demonstrates the ability to make significant improvements in function in a reasonable and predictable amount of time.  Equipment Recommendations  None recommended by OT       Precautions / Restrictions Precautions Precautions: Fall;Back Precaution Booklet Issued: Yes (comment) Restrictions Weight Bearing Restrictions: No      Mobility Bed Mobility Overal bed mobility: Needs Assistance Bed Mobility: Rolling, Sidelying to Sit Rolling: Supervision Sidelying to sit: Supervision       General bed mobility comments: cues for log roll    Transfers Overall transfer level: Needs assistance Equipment used: Rolling walker (2 wheels) Transfers: Sit to/from Stand Sit to Stand: Supervision                   Balance Overall balance assessment: No apparent balance deficits (not formally assessed)           ADL either performed or assessed with clinical judgement   ADL Overall ADL's : Needs assistance/impaired Eating/Feeding: Independent   Grooming: Supervision/safety;Standing   Upper Body Bathing: Set up;Sitting   Lower Body Bathing: Minimal assistance;Sit to/from stand   Upper Body Dressing : Set up;Sitting   Lower Body Dressing: Minimal assistance;Sit to/from stand   Toilet Transfer: Supervision/safety;Rolling walker (2 wheels)   Toileting- Clothing Manipulation and Hygiene: Supervision/safety;Sitting/lateral lean       Functional mobility during ADLs: Supervision/safety;Rolling walker (2 wheels) General ADL Comments: min A for LB ADLs for pain mgmt, son present and able to assist at discharge     Vision Baseline Vision/History: 1 Wears glasses Vision Assessment?: No apparent visual deficits     Perception Perception: Within Functional Limits       Praxis Praxis: WFL       Pertinent Vitals/Pain Pain Assessment Pain Assessment: Faces Faces Pain Scale: Hurts little more Pain Location: back Pain Descriptors / Indicators: Discomfort Pain Intervention(s): Limited activity within patient's tolerance, Monitored during session     Extremity/Trunk Assessment Upper Extremity Assessment Upper Extremity Assessment: Overall WFL for tasks assessed   Lower Extremity Assessment Lower Extremity Assessment: Generalized weakness   Cervical / Trunk Assessment Cervical / Trunk Assessment: Back Surgery   Communication Communication Communication: No apparent difficulties   Cognition Arousal: Alert Behavior During Therapy: WFL for tasks assessed/performed Overall Cognitive Status: Difficult to assess  General Comments: pt speaks limited english, son present and interpreting. seeming cog is [facility].      General Comments  VSS on RA, son present     Home Living Family/patient expects to be discharged to:: Private residence Living Arrangements: Spouse/significant other Available Help at Discharge: Family;Available 24 hours/day Type of Home: House Home Access: Stairs to enter [employer] of Steps: 1 Entrance Stairs-Rails: None Home Layout: Two level;Able to live on main level with bedroom/bathroom     Bathroom Shower/Tub: Chief Strategy Officer: Standard     Home Equipment: Agricultural consultant (2 wheels);Rollator (4 wheels);Shower seat   Additional Comments: husband has cog impairment      Prior Functioning/Environment Prior Level of Function : Independent/Modified Independent;Driving             Mobility Comments: RW vs Rollator ADLs Comments: mod I, drives, care for husband        OT Problem List: Decreased activity tolerance;Decreased knowledge of precautions         OT Goals(Current goals can be found in the care plan section) Acute Rehab OT Goals Patient Stated Goal: home OT Goal Formulation: With patient/family Time For Goal Achievement: 06/29/23 Potential to Achieve Goals: Good   AM-PAC OT "6 Clicks" Daily Activity     Outcome Measure Help from another person eating meals?: None Help from another person taking care of personal grooming?: A Little Help from another person toileting, which includes using toliet, bedpan, or urinal?: A Little Help from another person bathing (including washing, rinsing, drying)?: A Little Help from another person to put on and taking off regular upper body clothing?: A Little Help from another person to put on and taking off regular lower body clothing?: A Little 6 Click Score: 19   End of Session Equipment Utilized During Treatment: Rolling walker (2 wheels) Nurse Communication: Mobility status  Activity Tolerance: Patient tolerated treatment well Patient left: in bed;with call bell/phone within  reach;with bed alarm set;with family/visitor present  OT Visit Diagnosis: Other abnormalities of gait and mobility (R26.89);Pain                Time: 1610-9604 OT Time Calculation (min): 24 min Charges:  OT General Charges $OT Visit: 1 Visit OT Evaluation $OT Eval Moderate Complexity: 1 Mod OT Treatments $Self Care/Home Management : 8-22 mins  Derenda Mis, OTR/L Acute Rehabilitation Services Office [phone] Secure Chat Communication Preferred   Donia Pounds 06/29/2023, 9:10 AM

## 2023-06-29 NOTE — TOC Transition Note (Signed)
Transition of Care [facility]) - CM/SW Discharge Note   Patient Details  Name: Jennifer Russo MRN: [id number] Date of Birth: Jun 01, 1941  Transition of Care [facility]) CM/SW Contact:  Kermit Balo, RN Phone Number: 06/29/2023, 10:02 AM   Clinical Narrative:      Pt is discharging home with home health services through [place]. Information on the AVS. Frances [facility] will contact her for the first home visit. Pt has transportation home.       Patient Goals and CMS Choice      Discharge Placement                         Discharge Plan and Services Additional resources added to the After Visit Summary for                                       Social Determinants of Health (SDOH) Interventions SDOH Screenings   Tobacco Use: Low Risk  (06/28/2023)     Readmission Risk Interventions     No data to display

## 2023-06-29 NOTE — Progress Notes (Signed)
PT Cancellation Note and Discharge  Patient Details Name: Jennifer Russo MRN: [id number] DOB: 02/19/1941   Cancelled Treatment:    Reason Eval/Treat Not Completed: PT screened, no needs identified, will sign off. Discussed pt case with OT who reports pt is currently mobilizing at an improved level compared to d/c and does not require a formal PT evaluation at this time. PT signing off. If needs change, please reconsult.     Marylynn Pearson 06/29/2023, 9:22 AM  Conni Slipper, PT, DPT Acute Rehabilitation Services Secure Chat Preferred Office: [phone]

## 2023-07-02 DIAGNOSIS — E785 Hyperlipidemia, unspecified: Secondary | ICD-10-CM | POA: Diagnosis not present

## 2023-07-02 DIAGNOSIS — Z4789 Encounter for other orthopedic aftercare: Secondary | ICD-10-CM | POA: Diagnosis not present

## 2023-07-02 DIAGNOSIS — I10 Essential (primary) hypertension: Secondary | ICD-10-CM | POA: Diagnosis not present

## 2023-07-02 DIAGNOSIS — I2510 Atherosclerotic heart disease of native coronary artery without angina pectoris: Secondary | ICD-10-CM | POA: Diagnosis not present

## 2023-07-02 DIAGNOSIS — M4804 Spinal stenosis, thoracic region: Secondary | ICD-10-CM | POA: Diagnosis not present

## 2023-07-06 DIAGNOSIS — I2510 Atherosclerotic heart disease of native coronary artery without angina pectoris: Secondary | ICD-10-CM | POA: Diagnosis not present

## 2023-07-06 DIAGNOSIS — M4804 Spinal stenosis, thoracic region: Secondary | ICD-10-CM | POA: Diagnosis not present

## 2023-07-06 DIAGNOSIS — I10 Essential (primary) hypertension: Secondary | ICD-10-CM | POA: Diagnosis not present

## 2023-07-06 DIAGNOSIS — E785 Hyperlipidemia, unspecified: Secondary | ICD-10-CM | POA: Diagnosis not present

## 2023-07-06 DIAGNOSIS — Z4789 Encounter for other orthopedic aftercare: Secondary | ICD-10-CM | POA: Diagnosis not present

## 2023-07-08 DIAGNOSIS — I10 Essential (primary) hypertension: Secondary | ICD-10-CM | POA: Diagnosis not present

## 2023-07-08 DIAGNOSIS — M4804 Spinal stenosis, thoracic region: Secondary | ICD-10-CM | POA: Diagnosis not present

## 2023-07-08 DIAGNOSIS — Z4789 Encounter for other orthopedic aftercare: Secondary | ICD-10-CM | POA: Diagnosis not present

## 2023-07-08 DIAGNOSIS — I2510 Atherosclerotic heart disease of native coronary artery without angina pectoris: Secondary | ICD-10-CM | POA: Diagnosis not present

## 2023-07-08 DIAGNOSIS — E785 Hyperlipidemia, unspecified: Secondary | ICD-10-CM | POA: Diagnosis not present

## 2023-07-13 DIAGNOSIS — E785 Hyperlipidemia, unspecified: Secondary | ICD-10-CM | POA: Diagnosis not present

## 2023-07-13 DIAGNOSIS — M4804 Spinal stenosis, thoracic region: Secondary | ICD-10-CM | POA: Diagnosis not present

## 2023-07-13 DIAGNOSIS — I2510 Atherosclerotic heart disease of native coronary artery without angina pectoris: Secondary | ICD-10-CM | POA: Diagnosis not present

## 2023-07-13 DIAGNOSIS — I10 Essential (primary) hypertension: Secondary | ICD-10-CM | POA: Diagnosis not present

## 2023-07-13 DIAGNOSIS — Z4789 Encounter for other orthopedic aftercare: Secondary | ICD-10-CM | POA: Diagnosis not present

## 2023-07-15 DIAGNOSIS — M4804 Spinal stenosis, thoracic region: Secondary | ICD-10-CM | POA: Diagnosis not present

## 2023-07-15 DIAGNOSIS — I10 Essential (primary) hypertension: Secondary | ICD-10-CM | POA: Diagnosis not present

## 2023-07-15 DIAGNOSIS — I2510 Atherosclerotic heart disease of native coronary artery without angina pectoris: Secondary | ICD-10-CM | POA: Diagnosis not present

## 2023-07-15 DIAGNOSIS — Z4789 Encounter for other orthopedic aftercare: Secondary | ICD-10-CM | POA: Diagnosis not present

## 2023-07-15 DIAGNOSIS — E785 Hyperlipidemia, unspecified: Secondary | ICD-10-CM | POA: Diagnosis not present

## 2023-07-20 DIAGNOSIS — I2510 Atherosclerotic heart disease of native coronary artery without angina pectoris: Secondary | ICD-10-CM | POA: Diagnosis not present

## 2023-07-20 DIAGNOSIS — I10 Essential (primary) hypertension: Secondary | ICD-10-CM | POA: Diagnosis not present

## 2023-07-20 DIAGNOSIS — M4804 Spinal stenosis, thoracic region: Secondary | ICD-10-CM | POA: Diagnosis not present

## 2023-07-20 DIAGNOSIS — E785 Hyperlipidemia, unspecified: Secondary | ICD-10-CM | POA: Diagnosis not present

## 2023-07-20 DIAGNOSIS — Z4789 Encounter for other orthopedic aftercare: Secondary | ICD-10-CM | POA: Diagnosis not present

## 2023-07-22 DIAGNOSIS — M4804 Spinal stenosis, thoracic region: Secondary | ICD-10-CM | POA: Diagnosis not present

## 2023-07-22 DIAGNOSIS — I2510 Atherosclerotic heart disease of native coronary artery without angina pectoris: Secondary | ICD-10-CM | POA: Diagnosis not present

## 2023-07-22 DIAGNOSIS — E785 Hyperlipidemia, unspecified: Secondary | ICD-10-CM | POA: Diagnosis not present

## 2023-07-22 DIAGNOSIS — Z4789 Encounter for other orthopedic aftercare: Secondary | ICD-10-CM | POA: Diagnosis not present

## 2023-07-22 DIAGNOSIS — I10 Essential (primary) hypertension: Secondary | ICD-10-CM | POA: Diagnosis not present

## 2023-07-27 DIAGNOSIS — E785 Hyperlipidemia, unspecified: Secondary | ICD-10-CM | POA: Diagnosis not present

## 2023-07-27 DIAGNOSIS — I2510 Atherosclerotic heart disease of native coronary artery without angina pectoris: Secondary | ICD-10-CM | POA: Diagnosis not present

## 2023-07-27 DIAGNOSIS — M4804 Spinal stenosis, thoracic region: Secondary | ICD-10-CM | POA: Diagnosis not present

## 2023-07-27 DIAGNOSIS — Z4789 Encounter for other orthopedic aftercare: Secondary | ICD-10-CM | POA: Diagnosis not present

## 2023-07-27 DIAGNOSIS — I10 Essential (primary) hypertension: Secondary | ICD-10-CM | POA: Diagnosis not present

## 2023-08-03 DIAGNOSIS — E785 Hyperlipidemia, unspecified: Secondary | ICD-10-CM | POA: Diagnosis not present

## 2023-08-03 DIAGNOSIS — I10 Essential (primary) hypertension: Secondary | ICD-10-CM | POA: Diagnosis not present

## 2023-08-03 DIAGNOSIS — M4804 Spinal stenosis, thoracic region: Secondary | ICD-10-CM | POA: Diagnosis not present

## 2023-08-03 DIAGNOSIS — I2510 Atherosclerotic heart disease of native coronary artery without angina pectoris: Secondary | ICD-10-CM | POA: Diagnosis not present

## 2023-08-03 DIAGNOSIS — Z4789 Encounter for other orthopedic aftercare: Secondary | ICD-10-CM | POA: Diagnosis not present

## 2023-08-17 DIAGNOSIS — R112 Nausea with vomiting, unspecified: Secondary | ICD-10-CM | POA: Diagnosis not present

## 2023-08-17 DIAGNOSIS — M25551 Pain in right hip: Secondary | ICD-10-CM | POA: Diagnosis not present

## 2023-08-17 DIAGNOSIS — M545 Low back pain, unspecified: Secondary | ICD-10-CM | POA: Diagnosis not present

## 2023-08-24 DIAGNOSIS — I2510 Atherosclerotic heart disease of native coronary artery without angina pectoris: Secondary | ICD-10-CM | POA: Diagnosis not present

## 2023-08-24 DIAGNOSIS — Z4789 Encounter for other orthopedic aftercare: Secondary | ICD-10-CM | POA: Diagnosis not present

## 2023-08-24 DIAGNOSIS — M4804 Spinal stenosis, thoracic region: Secondary | ICD-10-CM | POA: Diagnosis not present

## 2023-08-24 DIAGNOSIS — E785 Hyperlipidemia, unspecified: Secondary | ICD-10-CM | POA: Diagnosis not present

## 2023-08-24 DIAGNOSIS — I10 Essential (primary) hypertension: Secondary | ICD-10-CM | POA: Diagnosis not present

## 2023-08-26 ENCOUNTER — Other Ambulatory Visit: Payer: Self-pay | Admitting: Gastroenterology

## 2023-08-26 DIAGNOSIS — M5451 Vertebrogenic low back pain: Secondary | ICD-10-CM | POA: Diagnosis not present

## 2023-08-26 DIAGNOSIS — R634 Abnormal weight loss: Secondary | ICD-10-CM

## 2023-08-26 DIAGNOSIS — M546 Pain in thoracic spine: Secondary | ICD-10-CM | POA: Diagnosis not present

## 2023-08-26 DIAGNOSIS — R112 Nausea with vomiting, unspecified: Secondary | ICD-10-CM

## 2023-09-14 ENCOUNTER — Ambulatory Visit
Admission: RE | Admit: 2023-09-14 | Discharge: 2023-09-14 | Disposition: A | Discharge status: Home / Self Care | Payer: Medicare Other | Source: Ambulatory Visit | Attending: Gastroenterology | Admitting: Gastroenterology

## 2023-09-14 DIAGNOSIS — R634 Abnormal weight loss: Secondary | ICD-10-CM

## 2023-09-14 DIAGNOSIS — K5711 Diverticulosis of small intestine without perforation or abscess with bleeding: Secondary | ICD-10-CM | POA: Diagnosis not present

## 2023-09-14 DIAGNOSIS — R112 Nausea with vomiting, unspecified: Secondary | ICD-10-CM | POA: Diagnosis not present

## 2023-10-05 DIAGNOSIS — H35373 Puckering of macula, bilateral: Secondary | ICD-10-CM | POA: Diagnosis not present

## 2023-10-05 DIAGNOSIS — H353233 Exudative age-related macular degeneration, bilateral, with inactive scar: Secondary | ICD-10-CM | POA: Diagnosis not present

## 2023-10-05 DIAGNOSIS — G992 Myelopathy in diseases classified elsewhere: Secondary | ICD-10-CM | POA: Diagnosis not present

## 2023-10-05 DIAGNOSIS — M545 Low back pain, unspecified: Secondary | ICD-10-CM | POA: Diagnosis not present

## 2023-10-05 DIAGNOSIS — H43812 Vitreous degeneration, left eye: Secondary | ICD-10-CM | POA: Diagnosis not present

## 2023-10-22 DIAGNOSIS — M810 Age-related osteoporosis without current pathological fracture: Secondary | ICD-10-CM | POA: Diagnosis not present

## 2023-10-22 DIAGNOSIS — I10 Essential (primary) hypertension: Secondary | ICD-10-CM | POA: Diagnosis not present

## 2023-10-22 DIAGNOSIS — E7800 Pure hypercholesterolemia, unspecified: Secondary | ICD-10-CM | POA: Diagnosis not present

## 2023-10-22 DIAGNOSIS — E039 Hypothyroidism, unspecified: Secondary | ICD-10-CM | POA: Diagnosis not present

## 2023-10-22 DIAGNOSIS — R7303 Prediabetes: Secondary | ICD-10-CM | POA: Diagnosis not present

## 2023-10-27 DIAGNOSIS — M810 Age-related osteoporosis without current pathological fracture: Secondary | ICD-10-CM | POA: Diagnosis not present

## 2023-10-27 DIAGNOSIS — Z8673 Personal history of transient ischemic attack (TIA), and cerebral infarction without residual deficits: Secondary | ICD-10-CM | POA: Diagnosis not present

## 2023-10-27 DIAGNOSIS — I10 Essential (primary) hypertension: Secondary | ICD-10-CM | POA: Diagnosis not present

## 2023-10-27 DIAGNOSIS — Z7902 Long term (current) use of antithrombotics/antiplatelets: Secondary | ICD-10-CM | POA: Diagnosis not present

## 2023-10-27 DIAGNOSIS — R8271 Bacteriuria: Secondary | ICD-10-CM | POA: Diagnosis not present

## 2023-10-27 DIAGNOSIS — I351 Nonrheumatic aortic (valve) insufficiency: Secondary | ICD-10-CM | POA: Diagnosis not present

## 2023-10-27 DIAGNOSIS — Z0000 Encounter for general adult medical examination without abnormal findings: Secondary | ICD-10-CM | POA: Diagnosis not present

## 2023-10-27 DIAGNOSIS — N1832 Chronic kidney disease, stage 3b: Secondary | ICD-10-CM | POA: Diagnosis not present

## 2023-11-03 DIAGNOSIS — N189 Chronic kidney disease, unspecified: Secondary | ICD-10-CM | POA: Diagnosis not present

## 2023-11-03 DIAGNOSIS — N1831 Chronic kidney disease, stage 3a: Secondary | ICD-10-CM | POA: Diagnosis not present

## 2023-11-24 DIAGNOSIS — S61213A Laceration without foreign body of left middle finger without damage to nail, initial encounter: Secondary | ICD-10-CM | POA: Diagnosis not present

## 2023-11-29 DIAGNOSIS — S61213A Laceration without foreign body of left middle finger without damage to nail, initial encounter: Secondary | ICD-10-CM | POA: Diagnosis not present

## 2023-11-29 DIAGNOSIS — Z09 Encounter for follow-up examination after completed treatment for conditions other than malignant neoplasm: Secondary | ICD-10-CM | POA: Diagnosis not present

## 2023-12-10 DIAGNOSIS — R112 Nausea with vomiting, unspecified: Secondary | ICD-10-CM | POA: Diagnosis not present

## 2024-01-13 DIAGNOSIS — N1832 Chronic kidney disease, stage 3b: Secondary | ICD-10-CM | POA: Diagnosis not present

## 2024-01-13 DIAGNOSIS — M810 Age-related osteoporosis without current pathological fracture: Secondary | ICD-10-CM | POA: Diagnosis not present

## 2024-01-13 DIAGNOSIS — I10 Essential (primary) hypertension: Secondary | ICD-10-CM | POA: Diagnosis not present

## 2024-01-13 DIAGNOSIS — Z8673 Personal history of transient ischemic attack (TIA), and cerebral infarction without residual deficits: Secondary | ICD-10-CM | POA: Diagnosis not present

## 2024-03-06 ENCOUNTER — Other Ambulatory Visit: Payer: Self-pay

## 2024-04-03 ENCOUNTER — Other Ambulatory Visit: Payer: Self-pay

## 2024-04-03 DIAGNOSIS — I350 Nonrheumatic aortic (valve) stenosis: Secondary | ICD-10-CM

## 2024-04-06 ENCOUNTER — Inpatient Hospital Stay (HOSPITAL_COMMUNITY)
Admission: EM | Admit: 2024-04-06 | Discharge: 2024-04-08 | DRG: 394 | Disposition: A | Discharge status: Home / Self Care | Attending: Internal Medicine | Admitting: Internal Medicine

## 2024-04-06 ENCOUNTER — Emergency Department (HOSPITAL_COMMUNITY)

## 2024-04-06 DIAGNOSIS — Z885 Allergy status to narcotic agent status: Secondary | ICD-10-CM

## 2024-04-06 DIAGNOSIS — Z882 Allergy status to sulfonamides status: Secondary | ICD-10-CM

## 2024-04-06 DIAGNOSIS — Z955 Presence of coronary angioplasty implant and graft: Secondary | ICD-10-CM

## 2024-04-06 DIAGNOSIS — N1832 Chronic kidney disease, stage 3b: Secondary | ICD-10-CM | POA: Diagnosis not present

## 2024-04-06 DIAGNOSIS — D72829 Elevated white blood cell count, unspecified: Secondary | ICD-10-CM | POA: Diagnosis present

## 2024-04-06 DIAGNOSIS — E782 Mixed hyperlipidemia: Secondary | ICD-10-CM

## 2024-04-06 DIAGNOSIS — H3530 Unspecified macular degeneration: Secondary | ICD-10-CM | POA: Diagnosis present

## 2024-04-06 DIAGNOSIS — Z8673 Personal history of transient ischemic attack (TIA), and cerebral infarction without residual deficits: Secondary | ICD-10-CM | POA: Diagnosis not present

## 2024-04-06 DIAGNOSIS — I774 Celiac artery compression syndrome: Secondary | ICD-10-CM | POA: Diagnosis not present

## 2024-04-06 DIAGNOSIS — Z888 Allergy status to other drugs, medicaments and biological substances status: Secondary | ICD-10-CM

## 2024-04-06 DIAGNOSIS — R1084 Generalized abdominal pain: Secondary | ICD-10-CM

## 2024-04-06 DIAGNOSIS — E872 Acidosis, unspecified: Secondary | ICD-10-CM | POA: Diagnosis not present

## 2024-04-06 DIAGNOSIS — R1013 Epigastric pain: Secondary | ICD-10-CM | POA: Diagnosis not present

## 2024-04-06 DIAGNOSIS — K551 Chronic vascular disorders of intestine: Principal | ICD-10-CM | POA: Diagnosis present

## 2024-04-06 DIAGNOSIS — R1010 Upper abdominal pain, unspecified: Secondary | ICD-10-CM | POA: Diagnosis not present

## 2024-04-06 DIAGNOSIS — M1990 Unspecified osteoarthritis, unspecified site: Secondary | ICD-10-CM | POA: Diagnosis present

## 2024-04-06 DIAGNOSIS — E039 Hypothyroidism, unspecified: Secondary | ICD-10-CM | POA: Diagnosis not present

## 2024-04-06 DIAGNOSIS — Z880 Allergy status to penicillin: Secondary | ICD-10-CM

## 2024-04-06 DIAGNOSIS — Z7902 Long term (current) use of antithrombotics/antiplatelets: Secondary | ICD-10-CM | POA: Diagnosis not present

## 2024-04-06 DIAGNOSIS — I130 Hypertensive heart and chronic kidney disease with heart failure and stage 1 through stage 4 chronic kidney disease, or unspecified chronic kidney disease: Secondary | ICD-10-CM | POA: Diagnosis present

## 2024-04-06 DIAGNOSIS — Z1152 Encounter for screening for COVID-19: Secondary | ICD-10-CM | POA: Diagnosis not present

## 2024-04-06 DIAGNOSIS — I2510 Atherosclerotic heart disease of native coronary artery without angina pectoris: Secondary | ICD-10-CM | POA: Diagnosis present

## 2024-04-06 DIAGNOSIS — R0989 Other specified symptoms and signs involving the circulatory and respiratory systems: Secondary | ICD-10-CM | POA: Diagnosis not present

## 2024-04-06 DIAGNOSIS — I708 Atherosclerosis of other arteries: Principal | ICD-10-CM | POA: Diagnosis present

## 2024-04-06 DIAGNOSIS — I5032 Chronic diastolic (congestive) heart failure: Secondary | ICD-10-CM | POA: Diagnosis present

## 2024-04-06 DIAGNOSIS — E785 Hyperlipidemia, unspecified: Secondary | ICD-10-CM | POA: Diagnosis not present

## 2024-04-06 DIAGNOSIS — I351 Nonrheumatic aortic (valve) insufficiency: Secondary | ICD-10-CM | POA: Diagnosis not present

## 2024-04-06 DIAGNOSIS — Z8679 Personal history of other diseases of the circulatory system: Secondary | ICD-10-CM | POA: Diagnosis not present

## 2024-04-06 DIAGNOSIS — R109 Unspecified abdominal pain: Secondary | ICD-10-CM | POA: Diagnosis present

## 2024-04-06 DIAGNOSIS — J984 Other disorders of lung: Secondary | ICD-10-CM | POA: Diagnosis not present

## 2024-04-06 DIAGNOSIS — Z743 Need for continuous supervision: Secondary | ICD-10-CM | POA: Diagnosis not present

## 2024-04-06 DIAGNOSIS — I701 Atherosclerosis of renal artery: Secondary | ICD-10-CM | POA: Diagnosis not present

## 2024-04-06 DIAGNOSIS — K5730 Diverticulosis of large intestine without perforation or abscess without bleeding: Secondary | ICD-10-CM | POA: Diagnosis not present

## 2024-04-06 DIAGNOSIS — Z79890 Hormone replacement therapy (postmenopausal): Secondary | ICD-10-CM

## 2024-04-06 DIAGNOSIS — Z79899 Other long term (current) drug therapy: Secondary | ICD-10-CM

## 2024-04-06 DIAGNOSIS — Z8249 Family history of ischemic heart disease and other diseases of the circulatory system: Secondary | ICD-10-CM | POA: Diagnosis not present

## 2024-04-06 DIAGNOSIS — R918 Other nonspecific abnormal finding of lung field: Secondary | ICD-10-CM | POA: Diagnosis not present

## 2024-04-06 DIAGNOSIS — R112 Nausea with vomiting, unspecified: Secondary | ICD-10-CM | POA: Diagnosis not present

## 2024-04-06 DIAGNOSIS — R1011 Right upper quadrant pain: Secondary | ICD-10-CM | POA: Diagnosis not present

## 2024-04-06 HISTORY — DX: Vascular disorder of intestine, unspecified: K55.9

## 2024-04-06 LAB — COMPREHENSIVE METABOLIC PANEL WITH GFR
ALT: 15 U/L (ref 0–44)
AST: 28 U/L (ref 15–41)
Albumin: 4 g/dL (ref 3.5–5.0)
Alkaline Phosphatase: 53 U/L (ref 38–126)
Anion gap: 15 (ref 5–15)
BUN: 33 mg/dL — ABNORMAL HIGH (ref 8–23)
CO2: 19 mmol/L — ABNORMAL LOW (ref 22–32)
Calcium: 9.8 mg/dL (ref 8.9–10.3)
Chloride: 98 mmol/L (ref 98–111)
Creatinine, Ser: 1.31 mg/dL — ABNORMAL HIGH (ref 0.44–1.00)
GFR, Estimated: 40 mL/min — ABNORMAL LOW (ref >60)
Glucose, Bld: 190 mg/dL — ABNORMAL HIGH (ref 70–99)
Potassium: 4 mmol/L (ref 3.5–5.1)
Sodium: 132 mmol/L — ABNORMAL LOW (ref 135–145)
Total Bilirubin: 0.6 mg/dL (ref 0.0–1.2)
Total Protein: 8.1 g/dL (ref 6.5–8.1)

## 2024-04-06 LAB — TYPE AND SCREEN
ABO/RH(D): A POS
Antibody Screen: NEGATIVE

## 2024-04-06 LAB — PROTIME-INR
INR: 1.1 (ref 0.8–1.2)
Prothrombin Time: 14.6 s (ref 11.4–15.2)

## 2024-04-06 LAB — I-STAT CG4 LACTIC ACID, ED
Lactic Acid, Venous: 3.4 mmol/L (ref 0.5–1.9)
Lactic Acid, Venous: 3.4 mmol/L (ref 0.5–1.9)

## 2024-04-06 LAB — CBC WITH DIFFERENTIAL/PLATELET
Abs Immature Granulocytes: 0.13 K/uL — ABNORMAL HIGH (ref 0.00–0.07)
Basophils Absolute: 0 K/uL (ref 0.0–0.1)
Basophils Relative: 0 %
Eosinophils Absolute: 0 K/uL (ref 0.0–0.5)
Eosinophils Relative: 0 %
HCT: 34.9 % — ABNORMAL LOW (ref 36.0–46.0)
Hemoglobin: 11.5 g/dL — ABNORMAL LOW (ref 12.0–15.0)
Immature Granulocytes: 1 %
Lymphocytes Relative: 6 %
Lymphs Abs: 1.1 K/uL (ref 0.7–4.0)
MCH: 31.4 pg (ref 26.0–34.0)
MCHC: 33 g/dL (ref 30.0–36.0)
MCV: 95.4 fL (ref 80.0–100.0)
Monocytes Absolute: 0.3 K/uL (ref 0.1–1.0)
Monocytes Relative: 2 %
Neutro Abs: 16.8 K/uL — ABNORMAL HIGH (ref 1.7–7.7)
Neutrophils Relative %: 91 %
Platelets: 279 K/uL (ref 150–400)
RBC: 3.66 MIL/uL — ABNORMAL LOW (ref 3.87–5.11)
RDW: 12.8 % (ref 11.5–15.5)
WBC: 18.3 K/uL — ABNORMAL HIGH (ref 4.0–10.5)
nRBC: 0 % (ref 0.0–0.2)

## 2024-04-06 LAB — LIPASE, BLOOD: Lipase: 35 U/L (ref 11–51)

## 2024-04-06 LAB — APTT: aPTT: 33 s (ref 24–36)

## 2024-04-06 MED ORDER — IOHEXOL 350 MG/ML SOLN
80.0000 mL | Freq: Once | INTRAVENOUS | Status: AC | PRN
  Administered 2024-04-06: 80 mL via INTRAVENOUS

## 2024-04-06 MED ORDER — SODIUM CHLORIDE 0.9 % IV SOLN
2.0000 g | INTRAVENOUS | Status: DC
  Administered 2024-04-06: 2 g via INTRAVENOUS
  Filled 2024-04-06: qty 12.5

## 2024-04-06 MED ORDER — LACTATED RINGERS IV BOLUS
1000.0000 mL | Freq: Once | INTRAVENOUS | Status: AC
  Administered 2024-04-07: 1000 mL via INTRAVENOUS

## 2024-04-06 MED ORDER — MORPHINE SULFATE (PF) 4 MG/ML IV SOLN
4.0000 mg | Freq: Once | INTRAVENOUS | Status: AC
  Administered 2024-04-06: 4 mg via INTRAVENOUS
  Filled 2024-04-06: qty 1

## 2024-04-06 MED ORDER — HEPARIN BOLUS VIA INFUSION
3000.0000 [IU] | Freq: Once | INTRAVENOUS | Status: AC
  Administered 2024-04-06: 3000 [IU] via INTRAVENOUS
  Filled 2024-04-06: qty 3000

## 2024-04-06 MED ORDER — HEPARIN (PORCINE) 25000 UT/250ML-% IV SOLN
850.0000 [IU]/h | INTRAVENOUS | Status: DC
  Administered 2024-04-06: 850 [IU]/h via INTRAVENOUS
  Filled 2024-04-06: qty 250

## 2024-04-06 MED ORDER — ONDANSETRON HCL 4 MG/2ML IJ SOLN
4.0000 mg | Freq: Once | INTRAMUSCULAR | Status: AC
  Administered 2024-04-06: 4 mg via INTRAVENOUS
  Filled 2024-04-06: qty 2

## 2024-04-06 MED ORDER — SODIUM CHLORIDE 0.9 % IV BOLUS
1000.0000 mL | Freq: Once | INTRAVENOUS | Status: AC
  Administered 2024-04-06: 1000 mL via INTRAVENOUS

## 2024-04-06 MED ORDER — METRONIDAZOLE 500 MG/100ML IV SOLN
500.0000 mg | Freq: Two times a day (BID) | INTRAVENOUS | Status: DC
  Administered 2024-04-06: 500 mg via INTRAVENOUS
  Filled 2024-04-06: qty 100

## 2024-04-06 NOTE — ED Notes (Signed)
 I-stat Lactic acid 3.44 EDP [place] notifed

## 2024-04-06 NOTE — ED Triage Notes (Signed)
 Patient arrived with complaints of upper abdominal pain, nausea and vomiting since 330pm today. Was at [facility] and sent here to be evaluated.

## 2024-04-06 NOTE — ED Notes (Signed)
 Jennifer Russo [phone] Pastors Wife

## 2024-04-06 NOTE — H&P (Signed)
 History and Physical      Jennifer Russo FMW:[id number] DOB: December 09, 1940 DOA: 04/06/2024; DOS: 04/06/2024  PCP: Clarice Nottingham, MD *** Patient coming from: home ***  I have personally briefly reviewed patient's old medical records in [facility]  Chief Complaint: ***  HPI: Jennifer Russo is a 83 y.o. female with medical history significant for *** who is admitted to [facility] on 04/06/2024 with *** after presenting from home*** to [facility] ED complaining of ***.    ***       ***   ED Course:  Vital signs in the ED were notable for the following: ***  Labs were notable for the following: ***  Per my interpretation, EKG in ED demonstrated the following:  ***  Imaging in the ED, per corresponding formal radiology read, was notable for the following:  ***  While in the ED, the following were administered: ***  Subsequently, the patient was admitted  ***  ***red    Review of Systems: As per HPI otherwise 10 point review of systems negative.   Past Medical History:  Diagnosis Date   Arthritis    Asymptomatic carotid artery stenosis    CAD (coronary artery disease)    s/p PCI- LAD and RCA with drug-eluting stents, PTCA in 2009 to treat in stent restenosis in RCA   Essential hypertension    HLD (hyperlipidemia)    Hypothyroidism    Macular degeneration    Stroke [facility])    when she lived in [place] (2000's)    Past Surgical History:  Procedure Laterality Date   CATARACT EXTRACTION     CORONARY ANGIOPLASTY  03-30-05 / 04-14-05   CORONARY STENT PLACEMENT     FEMORAL ARTERY REPAIR Right 07/03/2008   Groin area   KNEE ARTHROSCOPY Left 11/09/2022   Procedure: Left knee arthroscopy; meniscal debridement;  Surgeon: Melodi Lerner, MD;  Location: [facility] ORS;  Service: Orthopedics;  Laterality: Left;   KNEE SURGERY  07/2016   THORACIC DISCECTOMY N/A 06/28/2023   Procedure: Laminectomy - Thoracic eleven-Thoracic twelve;  Surgeon: Louis Shove, MD;  Location: [facility] OR;   Service: Neurosurgery;  Laterality: N/A;    Social History:  reports that she has never smoked. She has never used smokeless tobacco. She reports that she does not drink alcohol  and does not use drugs.   Allergies  Allergen Reactions   Atorvastatin     Unknown reaction    Penicillins Other (See Comments)    Doesn't recall   Sulfonamide Derivatives Other (See Comments)    Doesn't recall   Tramadol     Upset stomach    Family History  Problem Relation Age of Onset   Hypertension Mother    Diabetes Father     Family history reviewed and not pertinent ***   Prior to Admission medications   Medication Sig Start Date End Date Taking? Authorizing Provider  amLODipine  (NORVASC ) 10 MG tablet Take 10 mg by mouth at bedtime.    [provider]  Ascorbic Acid  (VITAMIN C ) 1000 MG tablet Take 1,000 mg by mouth daily.    [provider]  Calcium  Carb-Cholecalciferol  (CALCIUM  600 + D PO) Take 1 tablet by mouth in the morning and at bedtime.    [provider]  Cholecalciferol  (VITAMIN D ) 2000 UNITS CAPS Take 2,000 Units by mouth daily.    [provider]  clopidogrel  (PLAVIX ) 75 MG tablet Take 75 mg by mouth daily.    [provider]  HYDROcodone -acetaminophen  (NORCO/VICODIN)  5-325 MG tablet Take 1 tablet by mouth every 4 (four) hours as needed for moderate pain (pain score 4-6) ((score 4 to 6)). 06/29/23   Louis Shove, MD  levothyroxine  (SYNTHROID , LEVOTHROID) 50 MCG tablet Take 50 mcg by mouth daily before breakfast.    [provider]  metoprolol  (LOPRESSOR ) 50 MG tablet Take 50 mg by mouth 2 (two) times daily.    [provider]  Multiple Vitamins-Minerals (MULTIVITAL) tablet Take 1 tablet by mouth daily.    [provider]  Omega-3 Fatty Acids (FISH OIL) 1000 MG CAPS Take 1,000 mg by mouth daily.    [provider]  Polyethyl Glycol-Propyl Glycol (SYSTANE OP) Place 1 drop into both eyes 3 (three) times daily  as needed (drye eyes).    [provider]  predniSONE (STERAPRED UNI-PAK 21 TAB) 5 MG (21) TBPK tablet (Typical regimens for 21 tablet dose packs of Methylprednisolone  4mg , Prednisone 5mg , and Prednisone 10mg ) Day 1: 2 tabs before breakfast, 1 tab after lunch, 1 tab after supper, and 2 tabs at bedtime. Day 2: 1 tab before breakfast, 1 tab after lunch, 1 tab after supper, and 2 tabs at bedtime. Day 3: 1 tab before breakfast, 1 tab after lunch, 1 tab after supper, and 1 tab at bedtime. Day 4: 1 tab before breakfast, 1 tab after lunch, and 1 tab at bedtime. Day 5: 1 tab before breakfast and 1 tab at bedtime. Day 6: 1 tab before breakfast. 06/15/23   [provider]  rosuvastatin  (CRESTOR ) 20 MG tablet Take 20 mg by mouth daily.    [provider]  spironolactone  (ALDACTONE ) 50 MG tablet Take 50 mg by mouth daily. 10/26/22   [provider]  telmisartan -hydrochlorothiazide  (MICARDIS  HCT) 80-25 MG per tablet Take 1 tablet by mouth daily.    [provider]     Objective    Physical Exam: Vitals:   04/06/24 2100 04/06/24 2130 04/06/24 2308 04/06/24 2353  BP:    (!) 135/54  Pulse:  68  73  Resp:  19  18  Temp:   98.8 F (37.1 C) 98.6 F (37 C)  TempSrc:      SpO2:  99%  97%  Weight: 52.2 kg     Height: 5' 3 (1.6 m)       General: appears to be stated age; alert, oriented Skin: warm, dry, no rash Head:  AT/Welsh Mouth:  Oral mucosa membranes appear moist, normal dentition Neck: supple; trachea midline Heart:  RRR; did not appreciate any M/R/G Lungs: CTAB, did not appreciate any wheezes, rales, or rhonchi Abdomen: + BS; soft, ND, NT Vascular: 2+ pedal pulses b/l; 2+ radial pulses b/l Extremities: no peripheral edema, no muscle wasting Neuro: strength and sensation intact in upper and lower extremities b/l ***   *** Neuro: 5/5 strength of the proximal and distal flexors and extensors of the upper and lower extremities bilaterally;  sensation intact in upper and lower extremities b/l; cranial nerves II through XII grossly intact; no pronator drift; no evidence suggestive of slurred speech, dysarthria, or facial droop; Normal muscle tone. No tremors.  *** Neuro: In the setting of the patient's current mental status and associated inability to follow instructions, unable to perform full neurologic exam at this time.  As such, assessment of strength, sensation, and cranial nerves is limited at this time. Patient noted to spontaneously move all 4 extremities. No tremors.  ***    Labs on Admission: I have personally reviewed following labs and  imaging studies  CBC: Recent Labs  Lab 04/06/24 2018  WBC 18.3*  NEUTROABS 16.8*  HGB 11.5*  HCT 34.9*  MCV 95.4  PLT 279   Basic Metabolic Panel: Recent Labs  Lab 04/06/24 2018  NA 132*  K 4.0  CL 98  CO2 19*  GLUCOSE 190*  BUN 33*  CREATININE 1.31*  CALCIUM  9.8   GFR: Estimated Creatinine Clearance: 25.9 mL/min (A) (by C-G formula based on SCr of 1.31 mg/dL (H)). Liver Function Tests: Recent Labs  Lab 04/06/24 2018  AST 28  ALT 15  ALKPHOS 53  BILITOT 0.6  PROT 8.1  ALBUMIN 4.0   Recent Labs  Lab 04/06/24 2018  LIPASE 35   No results for input(s): AMMONIA in the last 168 hours. Coagulation Profile: Recent Labs  Lab 04/06/24 2052  INR 1.1   Cardiac Enzymes: No results for input(s): CKTOTAL, CKMB, CKMBINDEX, TROPONINI in the last 168 hours. BNP (last 3 results) No results for input(s): PROBNP in the last 8760 hours. HbA1C: No results for input(s): HGBA1C in the last 72 hours. CBG: No results for input(s): GLUCAP in the last 168 hours. Lipid Profile: No results for input(s): CHOL, HDL, LDLCALC, TRIG, CHOLHDL, LDLDIRECT in the last 72 hours. Thyroid  Function Tests: No results for input(s): TSH, T4TOTAL, FREET4, T3FREE, THYROIDAB in the last 72 hours. Anemia Panel: No results for input(s): VITAMINB12,  FOLATE, FERRITIN, TIBC, IRON, RETICCTPCT in the last 72 hours. Urine analysis: No results found for: COLORURINE, APPEARANCEUR, LABSPEC, PHURINE, GLUCOSEU, HGBUR, BILIRUBINUR, KETONESUR, PROTEINUR, UROBILINOGEN, NITRITE, LEUKOCYTESUR  Radiological Exams on Admission: DG Chest Portable 1 View Result Date: 04/06/2024 CLINICAL DATA:  Upper abdominal pain with nausea and vomiting EXAM: PORTABLE CHEST 1 VIEW COMPARISON:  Chest x-ray report 06/28/2008, CT 04/06/2024 FINDINGS: Mild cardiomegaly with central congestion. Mild streaky airspace disease at the left base. Aortic atherosclerosis. No pneumothorax. Possible small left effusion IMPRESSION: Mild cardiomegaly with central congestion. Mild streaky airspace disease at the left base, corresponding to airways thickening and patchy airspace opacity on preceding CT Electronically Signed   By: Luke Bun M.D.   On: 04/06/2024 23:05   CT Angio Abd/Pel W and/or Wo Contrast Result Date: 04/06/2024 CLINICAL DATA:  Mesenteric ischemia, acute. Upper abdominal pain, nausea, vomiting EXAM: CTA ABDOMEN AND PELVIS WITHOUT AND WITH CONTRAST TECHNIQUE: Multidetector CT imaging of the abdomen and pelvis was performed using the standard protocol during bolus administration of intravenous contrast. Multiplanar reconstructed images and MIPs were obtained and reviewed to evaluate the vascular anatomy. RADIATION DOSE REDUCTION: This exam was performed according to the departmental dose-optimization program which includes automated exposure control, adjustment of the mA and/or kV according to patient size and/or use of iterative reconstruction technique. CONTRAST:  80mL OMNIPAQUE  IOHEXOL  350 MG/ML SOLN COMPARISON:  10/02/2013 FINDINGS: VASCULAR Aorta: Atherosclerotic irregularity and diffuse calcifications. No aneurysm or dissection. 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  SMA: Calcified plaque at the origin with moderate send no CIS at the origin. Vessel otherwise patent. Renals: Calcified plaque at the origins bilaterally with suspected mild-to-moderate stenosis bilaterally. Vessels otherwise widely patent. IMA: Occluded, fills via collaterals. Inflow: Patent without evidence of aneurysm, dissection, vasculitis or significant stenosis. Proximal Outflow: Bilateral common femoral and visualized portions of the superficial and profunda femoral arteries are patent without evidence of aneurysm, dissection, vasculitis or significant stenosis. Veins: No obvious venous abnormality within the limitations of this arterial phase study. Review of the MIP images confirms  the above findings. NON-VASCULAR Lower chest: 6 airway thickening in the lower lobes bilaterally with bilateral lower lobe airspace opacities. No effusions. Cardiomegaly. Coronary artery and aortic atherosclerosis. Hepatobiliary: No focal abnormality or ductal dilatation. Pancreas: No focal abnormality or ductal dilatation. Spleen: No focal abnormality.  Normal size. Adrenals/Urinary Tract: Mild atrophy of the left kidney with renal cortical scarring/thinning. No hydronephrosis or renal or adrenal mass. Urinary bladder unremarkable. Stomach/Bowel: Scattered colonic diverticula. No active diverticulitis. Stomach and small bowel decompressed. No bowel obstruction or inflammatory process. No wall changes to suggest ischemia. Lymphatic: No adenopathy Reproductive: Uterus and adnexa unremarkable.  No mass. Other: No free fluid or free air. Musculoskeletal: No acute bony abnormality. 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 Both appear to fill via SMA collaterals. Mild-to-moderate stenosis suspected at the origin of the SMA. Mild-to-moderate stenosis of the origin of both renal arteries. Aortic atherosclerosis.  No aneurysm or dissection. NON-VASCULAR Bilateral airway thickening and lower  lobe airspace disease. This could reflect atelectasis or pneumonia. Cardiomegaly, coronary artery disease. Mild atrophy of the left kidney with cortical thinning/scarring. Scattered colonic diverticulosis.  No active diverticulitis. No acute findings in the abdomen or pelvis. Electronically Signed   By: Franky Crease M.D.   On: 04/06/2024 22:18      Assessment/Plan   Principal Problem:   Abdominal pain   ***            ***                  ***                   ***                  ***                  ***                  ***                   ***                  ***                  ***                  ***                  ***                 ***                ***  DVT prophylaxis: SCD's ***  Code Status: Full code*** Family Communication: none*** Disposition Plan: Per Rounding Team Consults called: none***;  Admission status: ***     I SPENT GREATER THAN 75 *** MINUTES IN CLINICAL CARE TIME/MEDICAL DECISION-MAKING IN COMPLETING THIS ADMISSION.      Eva NOVAK Filippa Yarbough DO [facility]  From 7PM - 7AM   04/06/2024, 11:59 PM   ***

## 2024-04-06 NOTE — ED Notes (Signed)
 I-stat Lactic acid 3.34 EDP notified.

## 2024-04-06 NOTE — ED Provider Notes (Signed)
 [place] EMERGENCY DEPARTMENT AT [facility] Provider Note   CSN: [id number] Arrival date & time: 04/06/24  8086     Patient presents with: Abdominal Pain   Jennifer Russo is a 83 y.o. female.  With a history of ischemic bowel who presents to the ED for abdominal pain.  Acute onset abdominal pain around 1530 today while patient was at rest.  Pain localized over the epigastric region.  She was seen at a walk-in clinic and directed here for further evaluation given concern for acute intra-abdominal process.  Some associated nausea and vomiting as well.  No fevers chills or urinary symptoms.  No changes in bowel habits.    Abdominal Pain      Prior to Admission medications   Medication Sig Start Date End Date Taking? Authorizing Provider  amLODipine  (NORVASC ) 10 MG tablet Take 10 mg by mouth at bedtime.    [provider]  Ascorbic Acid  (VITAMIN C ) 1000 MG tablet Take 1,000 mg by mouth daily.    [provider]  Calcium  Carb-Cholecalciferol  (CALCIUM  600 + D PO) Take 1 tablet by mouth in the morning and at bedtime.    [provider]  Cholecalciferol  (VITAMIN D ) 2000 UNITS CAPS Take 2,000 Units by mouth daily.    [provider]  clopidogrel  (PLAVIX ) 75 MG tablet Take 75 mg by mouth daily.    [provider]  HYDROcodone -acetaminophen  (NORCO/VICODIN) 5-325 MG tablet Take 1 tablet by mouth every 4 (four) hours as needed for moderate pain (pain score 4-6) ((score 4 to 6)). 06/29/23   Louis Shove, MD  levothyroxine  (SYNTHROID , LEVOTHROID) 50 MCG tablet Take 50 mcg by mouth daily before breakfast.    [provider]  metoprolol  (LOPRESSOR ) 50 MG tablet Take 50 mg by mouth 2 (two) times daily.    [provider]  Multiple Vitamins-Minerals (MULTIVITAL) tablet Take 1 tablet by mouth daily.    [provider]  Omega-3 Fatty Acids (FISH OIL) 1000 MG CAPS Take 1,000 mg by mouth daily.    [provider]   Polyethyl Glycol-Propyl Glycol (SYSTANE OP) Place 1 drop into both eyes 3 (three) times daily as needed (drye eyes).    [provider]  predniSONE (STERAPRED UNI-PAK 21 TAB) 5 MG (21) TBPK tablet (Typical regimens for 21 tablet dose packs of Methylprednisolone  4mg , Prednisone 5mg , and Prednisone 10mg ) Day 1: 2 tabs before breakfast, 1 tab after lunch, 1 tab after supper, and 2 tabs at bedtime. Day 2: 1 tab before breakfast, 1 tab after lunch, 1 tab after supper, and 2 tabs at bedtime. Day 3: 1 tab before breakfast, 1 tab after lunch, 1 tab after supper, and 1 tab at bedtime. Day 4: 1 tab before breakfast, 1 tab after lunch, and 1 tab at bedtime. Day 5: 1 tab before breakfast and 1 tab at bedtime. Day 6: 1 tab before breakfast. 06/15/23   [provider]  rosuvastatin  (CRESTOR ) 20 MG tablet Take 20 mg by mouth daily.    [provider]  spironolactone  (ALDACTONE ) 50 MG tablet Take 50 mg by mouth daily. 10/26/22   [provider]  telmisartan -hydrochlorothiazide  (MICARDIS  HCT) 80-25 MG per tablet Take 1 tablet by mouth daily.    [provider]    Allergies: Atorvastatin, Penicillins, Sulfonamide derivatives, and Tramadol    Review of Systems  Gastrointestinal:  Positive for abdominal pain.    Updated Vital Signs BP (!) 151/58   Pulse 68   Temp 98.8 F (  37.1 C)   Resp 19   Ht 5' 3 (1.6 m)   Wt 52.2 kg   SpO2 99%   BMI 20.39 kg/m   Physical Exam Vitals and nursing note reviewed.  HENT:     Head: Normocephalic and atraumatic.  Eyes:     Pupils: Pupils are equal, round, and reactive to light.  Cardiovascular:     Rate and Rhythm: Normal rate and regular rhythm.  Pulmonary:     Effort: Pulmonary effort is normal.     Breath sounds: Normal breath sounds.  Abdominal:     Palpations: Abdomen is soft.     Tenderness: There is abdominal tenderness in the epigastric area and periumbilical area. There is guarding. There is no rebound.      Comments: Palpable aortic pulse  Skin:    General: Skin is warm and dry.  Neurological:     Mental Status: She is alert.  Psychiatric:        Mood and Affect: Mood normal.     (all labs ordered are listed, but only abnormal results are displayed) Labs Reviewed  COMPREHENSIVE METABOLIC PANEL WITH GFR - Abnormal; Notable for the following components:      Result Value   Sodium 132 (*)    CO2 19 (*)    Glucose, Bld 190 (*)    BUN 33 (*)    Creatinine, Ser 1.31 (*)    GFR, Estimated 40 (*)    All other components within normal limits  CBC WITH DIFFERENTIAL/PLATELET - Abnormal; Notable for the following components:   WBC 18.3 (*)    RBC 3.66 (*)    Hemoglobin 11.5 (*)    HCT 34.9 (*)    Neutro Abs 16.8 (*)    Abs Immature Granulocytes 0.13 (*)    All other components within normal limits  I-STAT CG4 LACTIC ACID, ED - Abnormal; Notable for the following components:   Lactic Acid, Venous 3.4 (*)    All other components within normal limits  I-STAT CG4 LACTIC ACID, ED - Abnormal; Notable for the following components:   Lactic Acid, Venous 3.4 (*)    All other components within normal limits  RESP PANEL BY RT-PCR (RSV, FLU A&B, COVID)  RVPGX2  LIPASE, BLOOD  APTT  PROTIME-INR  URINALYSIS, W/ REFLEX TO CULTURE (INFECTION SUSPECTED)  CBC  HEPARIN  LEVEL (UNFRACTIONATED)  TYPE AND SCREEN  ABO/RH    EKG: EKG Interpretation Date/Time:  Thursday April 06 2024 20:29:22 EDT Ventricular Rate:  67 PR Interval:  237 QRS Duration:  83 QT Interval:  432 QTC Calculation: 457 R Axis:   72  Text Interpretation: Sinus rhythm Ventricular premature complex Prolonged PR interval Left ventricular hypertrophy Confirmed by Pamella Sharper [phone]) on 04/06/2024 9:39:29 PM  Radiology: DG Chest Portable 1 View Result Date: 04/06/2024 CLINICAL DATA:  Upper abdominal pain with nausea and vomiting EXAM: PORTABLE CHEST 1 VIEW COMPARISON:  Chest x-ray report 06/28/2008, CT 04/06/2024 FINDINGS:  Mild cardiomegaly with central congestion. Mild streaky airspace disease at the left base. Aortic atherosclerosis. No pneumothorax. Possible small left effusion IMPRESSION: Mild cardiomegaly with central congestion. Mild streaky airspace disease at the left base, corresponding to airways thickening and patchy airspace opacity on preceding CT Electronically Signed   By: Luke Bun M.D.   On: 04/06/2024 23:05   CT Angio Abd/Pel W and/or Wo Contrast Result Date: 04/06/2024 CLINICAL DATA:  Mesenteric ischemia, acute. Upper abdominal pain, nausea, vomiting EXAM: CTA ABDOMEN AND PELVIS WITHOUT AND WITH CONTRAST TECHNIQUE:  Multidetector CT imaging of the abdomen and pelvis was performed using the standard protocol during bolus administration of intravenous contrast. Multiplanar reconstructed images and MIPs were obtained and reviewed to evaluate the vascular anatomy. RADIATION DOSE REDUCTION: This exam was performed according to the departmental dose-optimization program which includes automated exposure control, adjustment of the mA and/or kV according to patient size and/or use of iterative reconstruction technique. CONTRAST:  80mL OMNIPAQUE  IOHEXOL  350 MG/ML SOLN COMPARISON:  10/02/2013 FINDINGS: VASCULAR Aorta: Atherosclerotic irregularity and diffuse calcifications. No aneurysm or dissection. 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 SMA: Calcified plaque at the origin with moderate send no CIS at the origin. Vessel otherwise patent. Renals: Calcified plaque at the origins bilaterally with suspected mild-to-moderate stenosis bilaterally. Vessels otherwise widely patent. IMA: Occluded, fills via collaterals. Inflow: Patent without evidence of aneurysm, dissection, vasculitis or significant stenosis. Proximal Outflow: Bilateral common femoral and visualized portions of the superficial and profunda femoral arteries are patent without  evidence of aneurysm, dissection, vasculitis or significant stenosis. Veins: No obvious venous abnormality within the limitations of this arterial phase study. Review of the MIP images confirms the above findings. NON-VASCULAR Lower chest: 6 airway thickening in the lower lobes bilaterally with bilateral lower lobe airspace opacities. No effusions. Cardiomegaly. Coronary artery and aortic atherosclerosis. Hepatobiliary: No focal abnormality or ductal dilatation. Pancreas: No focal abnormality or ductal dilatation. Spleen: No focal abnormality.  Normal size. Adrenals/Urinary Tract: Mild atrophy of the left kidney with renal cortical scarring/thinning. No hydronephrosis or renal or adrenal mass. Urinary bladder unremarkable. Stomach/Bowel: Scattered colonic diverticula. No active diverticulitis. Stomach and small bowel decompressed. No bowel obstruction or inflammatory process. No wall changes to suggest ischemia. Lymphatic: No adenopathy Reproductive: Uterus and adnexa unremarkable.  No mass. Other: No free fluid or free air. Musculoskeletal: No acute bony abnormality. 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 Both appear to fill via SMA collaterals. Mild-to-moderate stenosis suspected at the origin of the SMA. Mild-to-moderate stenosis of the origin of both renal arteries. Aortic atherosclerosis.  No aneurysm or dissection. NON-VASCULAR Bilateral airway thickening and lower lobe airspace disease. This could reflect atelectasis or pneumonia. Cardiomegaly, coronary artery disease. Mild atrophy of the left kidney with cortical thinning/scarring. Scattered colonic diverticulosis.  No active diverticulitis. No acute findings in the abdomen or pelvis. Electronically Signed   By: Franky Crease M.D.   On: 04/06/2024 22:18     Procedures   Medications Ordered in the ED  metroNIDAZOLE  (FLAGYL ) IVPB 500 mg (0 mg Intravenous Stopped 04/06/24 2218)  ceFEPIme  (MAXIPIME ) 2 g in  sodium chloride  0.9 % 100 mL IVPB (0 g Intravenous Stopped 04/06/24 2218)  heparin  ADULT infusion 100 units/mL (25000 units/250mL) (850 Units/hr Intravenous New Bag/Given 04/06/24 2306)  lactated ringers  bolus 1,000 mL (has no administration in time range)  sodium chloride  0.9 % bolus 1,000 mL (0 mLs Intravenous Stopped 04/06/24 2126)  ondansetron  (ZOFRAN ) injection 4 mg (4 mg Intravenous Given 04/06/24 2014)  morphine  (PF) 4 MG/ML injection 4 mg (4 mg Intravenous Given 04/06/24 2127)  iohexol  (OMNIPAQUE ) 350 MG/ML injection 80 mL (80 mLs Intravenous Contrast Given 04/06/24 2142)  heparin  bolus via infusion 3,000 Units (3,000 Units Intravenous Bolus from Bag 04/06/24 2306)    Clinical Course as of 04/06/24 2351  Thu Apr 06, 2024  2107 Initial laboratory workup shows leukocytosis of 18.3 with elevation in venous lactic 3.4.  Renal function at baseline.  Awaiting results of CTA abdomen pelvis.  Will cover with broad-spectrum antibiotics for suspected intra-abdominal source [MP]  2225 CTA abdomen pelvis shows no AAA or dissection.  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  also question of lower lobe airspace disease.  Low suspicion for pneumonia given clinical presentation.  Paged vascular surgery. Will order heparin  bolus and gtt [MP]  2320 I discussed this patient with Dr.Hawken (vascular surgery) who agrees with plan to initiate heparin .  No need for emergent vascular intervention at this time however vascular team will round on her tomorrow morning at [facility].  Discussed with admitting hospitalist (Dr Marcene) who accepts patient for admission. [MP]    Clinical Course User Index [MP] Pamella Ozell LABOR, DO                                 Medical Decision Making 83 year old female with history as above presenting given concern for acute intra-abdominal process.  Acute onset abdominal pain started at 1530 while at rest and persisted since the onset.  Associated nausea vomiting.  Exam  notable for significant tenderness in the epigastrium and periumbilical region with some guarding.  No rebound.  Palpable abdominal aortic pulse.  No evidence of AAA or appreciable dissection on my bedside ultrasound but will need CTA to confirm.  Will send labs including coagulation studies, type and screen and lactic.  Will provide IV fluids, antibiotics to cover intra-abdominal source and morphine  for pain control.  Amount and/or Complexity of Data Reviewed Labs: ordered. Radiology: ordered.  Risk Prescription drug management. Decision regarding hospitalization.        Final diagnoses:  Obstruction of celiac artery  Abdominal pain, unspecified abdominal location    ED Discharge Orders     None          Pamella Ozell LABOR, DO 04/06/24 2351

## 2024-04-06 NOTE — Progress Notes (Signed)
 PHARMACY - ANTICOAGULATION CONSULT NOTE  Pharmacy Consult for heparin  Indication: celiac artery/IMA occlusion.   Allergies  Allergen Reactions   Atorvastatin     Unknown reaction    Penicillins Other (See Comments)    Doesn't recall   Sulfonamide Derivatives Other (See Comments)    Doesn't recall   Tramadol     Upset stomach    Patient Measurements: Height: 5' 3 (160 cm) Weight: 52.2 kg (115 lb 1.3 oz) IBW/kg (Calculated) : 52.4 HEPARIN  DW (KG): 52.2  Vital Signs: Temp: 97.4 F (36.3 C) (08/21 1922) Temp Source: Oral (08/21 1922) BP: 151/58 (08/21 2030) Pulse Rate: 68 (08/21 2130)  Labs: Recent Labs    04/06/24 2018 04/06/24 2052  HGB 11.5*  --   HCT 34.9*  --   PLT 279  --   APTT  --  33  LABPROT  --  14.6  INR  --  1.1  CREATININE 1.31*  --     Estimated Creatinine Clearance: 25.9 mL/min (A) (by C-G formula based on SCr of 1.31 mg/dL (H)).   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 Pharmacy to dose heparin , no prior AC noted  aPTT 33, PT 14.6, hgb 11.5, plts 279, scr 1.31  Goal of Therapy:  Heparin  level 0.3-0.7 units/ml Monitor platelets by anticoagulation protocol: Yes   Plan:  Heparin  bolus 3000 units x 1 Start heparin  drip at 850 units/hr Heparin  level in 8 hours Daily CBC  Leeroy Mace RPh 04/06/2024, 10:41 PM   Mace Millman E 04/06/2024,10:37 PM

## 2024-04-06 NOTE — Progress Notes (Signed)
 Discussed with Dr. Pamella. Epigastric abdominal pain beginning earlier today. CT angiogram personally reviewed. Agree celiac and IMA occlusion. SMA patent throughout the mesentery with some origin stenosis. No clear arterial cause for acute symptoms. 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 Recommend IVF resuscitation and trend of lactate and WBC. Keep NPO. Transfer to [facility].   Debby SAILOR. Magda, MD [facility] Phone Number: [phone] 04/06/2024 11:07 PM

## 2024-04-07 ENCOUNTER — Encounter (HOSPITAL_COMMUNITY): Payer: Self-pay | Admitting: Internal Medicine

## 2024-04-07 DIAGNOSIS — I708 Atherosclerosis of other arteries: Principal | ICD-10-CM | POA: Diagnosis present

## 2024-04-07 DIAGNOSIS — Z8679 Personal history of other diseases of the circulatory system: Secondary | ICD-10-CM

## 2024-04-07 DIAGNOSIS — K551 Chronic vascular disorders of intestine: Secondary | ICD-10-CM

## 2024-04-07 DIAGNOSIS — D72829 Elevated white blood cell count, unspecified: Secondary | ICD-10-CM | POA: Diagnosis present

## 2024-04-07 DIAGNOSIS — E039 Hypothyroidism, unspecified: Secondary | ICD-10-CM | POA: Diagnosis present

## 2024-04-07 DIAGNOSIS — E872 Acidosis, unspecified: Secondary | ICD-10-CM | POA: Diagnosis present

## 2024-04-07 DIAGNOSIS — I774 Celiac artery compression syndrome: Secondary | ICD-10-CM

## 2024-04-07 DIAGNOSIS — N1832 Chronic kidney disease, stage 3b: Secondary | ICD-10-CM | POA: Diagnosis present

## 2024-04-07 DIAGNOSIS — I5032 Chronic diastolic (congestive) heart failure: Secondary | ICD-10-CM | POA: Diagnosis present

## 2024-04-07 DIAGNOSIS — E785 Hyperlipidemia, unspecified: Secondary | ICD-10-CM | POA: Diagnosis present

## 2024-04-07 LAB — URINALYSIS, W/ REFLEX TO CULTURE (INFECTION SUSPECTED)
Bacteria, UA: NONE SEEN
Bilirubin Urine: NEGATIVE
Glucose, UA: 50 mg/dL — AB
Hgb urine dipstick: NEGATIVE
Ketones, ur: NEGATIVE mg/dL
Leukocytes,Ua: NEGATIVE
Nitrite: NEGATIVE
Protein, ur: NEGATIVE mg/dL
Specific Gravity, Urine: 1.023 (ref 1.005–1.030)
pH: 5 (ref 5.0–8.0)

## 2024-04-07 LAB — COMPREHENSIVE METABOLIC PANEL WITH GFR
ALT: 15 U/L (ref 0–44)
AST: 24 U/L (ref 15–41)
Albumin: 3.1 g/dL — ABNORMAL LOW (ref 3.5–5.0)
Alkaline Phosphatase: 42 U/L (ref 38–126)
Anion gap: 15 (ref 5–15)
BUN: 23 mg/dL (ref 8–23)
CO2: 20 mmol/L — ABNORMAL LOW (ref 22–32)
Calcium: 9.3 mg/dL (ref 8.9–10.3)
Chloride: 106 mmol/L (ref 98–111)
Creatinine, Ser: 1.11 mg/dL — ABNORMAL HIGH (ref 0.44–1.00)
GFR, Estimated: 49 mL/min — ABNORMAL LOW (ref >60)
Glucose, Bld: 127 mg/dL — ABNORMAL HIGH (ref 70–99)
Potassium: 4.5 mmol/L (ref 3.5–5.1)
Sodium: 141 mmol/L (ref 135–145)
Total Bilirubin: 0.6 mg/dL (ref 0.0–1.2)
Total Protein: 6.3 g/dL — ABNORMAL LOW (ref 6.5–8.1)

## 2024-04-07 LAB — MAGNESIUM: Magnesium: 1.7 mg/dL (ref 1.7–2.4)

## 2024-04-07 LAB — RESP PANEL BY RT-PCR (RSV, FLU A&B, COVID)  RVPGX2
Influenza A by PCR: NEGATIVE
Influenza B by PCR: NEGATIVE
Resp Syncytial Virus by PCR: NEGATIVE
SARS Coronavirus 2 by RT PCR: NEGATIVE

## 2024-04-07 LAB — LACTIC ACID, PLASMA: Lactic Acid, Venous: 1.9 mmol/L (ref 0.5–1.9)

## 2024-04-07 LAB — TYPE AND SCREEN
ABO/RH(D): A POS
Antibody Screen: NEGATIVE

## 2024-04-07 LAB — CBC
HCT: 29.5 % — ABNORMAL LOW (ref 36.0–46.0)
Hemoglobin: 10.3 g/dL — ABNORMAL LOW (ref 12.0–15.0)
MCH: 32.1 pg (ref 26.0–34.0)
MCHC: 34.9 g/dL (ref 30.0–36.0)
MCV: 91.9 fL (ref 80.0–100.0)
Platelets: 241 K/uL (ref 150–400)
RBC: 3.21 MIL/uL — ABNORMAL LOW (ref 3.87–5.11)
RDW: 12.7 % (ref 11.5–15.5)
WBC: 16.2 K/uL — ABNORMAL HIGH (ref 4.0–10.5)
nRBC: 0 % (ref 0.0–0.2)

## 2024-04-07 LAB — PROCALCITONIN: Procalcitonin: <0.1 ng/mL

## 2024-04-07 LAB — BRAIN NATRIURETIC PEPTIDE: B Natriuretic Peptide: 762.7 pg/mL — ABNORMAL HIGH (ref 0.0–100.0)

## 2024-04-07 LAB — HEPARIN LEVEL (UNFRACTIONATED): Heparin Unfractionated: 0.68 [IU]/mL (ref 0.30–0.70)

## 2024-04-07 LAB — TSH: TSH: 0.468 u[IU]/mL (ref 0.350–4.500)

## 2024-04-07 MED ORDER — PANTOPRAZOLE SODIUM 40 MG IV SOLR
40.0000 mg | INTRAVENOUS | Status: DC
  Administered 2024-04-07: 40 mg via INTRAVENOUS
  Filled 2024-04-07: qty 10

## 2024-04-07 MED ORDER — ACETAMINOPHEN 325 MG PO TABS: 650.0000 mg | ORAL_TABLET | Freq: Four times a day (QID) | ORAL | Status: DC | PRN

## 2024-04-07 MED ORDER — ACETAMINOPHEN 650 MG RE SUPP: 650.0000 mg | Freq: Four times a day (QID) | RECTAL | Status: DC | PRN

## 2024-04-07 MED ORDER — SPIRONOLACTONE 25 MG PO TABS
50.0000 mg | ORAL_TABLET | Freq: Every day | ORAL | Status: DC
  Administered 2024-04-07 – 2024-04-08 (×2): 50 mg via ORAL
  Filled 2024-04-07 (×2): qty 2

## 2024-04-07 MED ORDER — LACTATED RINGERS IV SOLN: INTRAVENOUS | Status: AC

## 2024-04-07 MED ORDER — HYDROCHLOROTHIAZIDE 25 MG PO TABS
25.0000 mg | ORAL_TABLET | Freq: Every day | ORAL | Status: DC
  Administered 2024-04-07 – 2024-04-08 (×2): 25 mg via ORAL
  Filled 2024-04-07 (×2): qty 1

## 2024-04-07 MED ORDER — CLOPIDOGREL BISULFATE 75 MG PO TABS: 75.0000 mg | ORAL_TABLET | Freq: Every day | ORAL | Status: DC

## 2024-04-07 MED ORDER — TELMISARTAN-HCTZ 80-25 MG PO TABS: 1.0000 | ORAL_TABLET | Freq: Every day | ORAL | Status: DC

## 2024-04-07 MED ORDER — CLOPIDOGREL BISULFATE 75 MG PO TABS
75.0000 mg | ORAL_TABLET | Freq: Every day | ORAL | Status: DC
  Administered 2024-04-07 – 2024-04-08 (×2): 75 mg via ORAL
  Filled 2024-04-07 (×2): qty 1

## 2024-04-07 MED ORDER — ROSUVASTATIN CALCIUM 20 MG PO TABS
20.0000 mg | ORAL_TABLET | Freq: Every day | ORAL | Status: DC
  Administered 2024-04-07 – 2024-04-08 (×2): 20 mg via ORAL
  Filled 2024-04-07 (×2): qty 1

## 2024-04-07 MED ORDER — METOPROLOL TARTRATE 50 MG PO TABS
50.0000 mg | ORAL_TABLET | Freq: Two times a day (BID) | ORAL | Status: DC
  Administered 2024-04-07 – 2024-04-08 (×3): 50 mg via ORAL
  Filled 2024-04-07 (×3): qty 1

## 2024-04-07 MED ORDER — FENTANYL CITRATE PF 50 MCG/ML IJ SOSY: 25.0000 ug | PREFILLED_SYRINGE | INTRAMUSCULAR | Status: DC | PRN

## 2024-04-07 MED ORDER — ONDANSETRON HCL 4 MG/2ML IJ SOLN: 4.0000 mg | Freq: Four times a day (QID) | INTRAMUSCULAR | Status: DC | PRN

## 2024-04-07 MED ORDER — VITAMIN C 500 MG PO TABS
1000.0000 mg | ORAL_TABLET | Freq: Every day | ORAL | Status: DC
  Administered 2024-04-07 – 2024-04-08 (×2): 1000 mg via ORAL
  Filled 2024-04-07 (×2): qty 2

## 2024-04-07 MED ORDER — IRBESARTAN 300 MG PO TABS
300.0000 mg | ORAL_TABLET | Freq: Every day | ORAL | Status: DC
  Administered 2024-04-07 – 2024-04-08 (×2): 300 mg via ORAL
  Filled 2024-04-07 (×2): qty 1

## 2024-04-07 MED ORDER — LACTATED RINGERS IV SOLN: INTRAVENOUS | Status: DC

## 2024-04-07 MED ORDER — PANTOPRAZOLE SODIUM 40 MG PO TBEC
40.0000 mg | DELAYED_RELEASE_TABLET | Freq: Every day | ORAL | Status: DC
  Administered 2024-04-08: 40 mg via ORAL
  Filled 2024-04-07: qty 1

## 2024-04-07 MED ORDER — AMLODIPINE BESYLATE 10 MG PO TABS
10.0000 mg | ORAL_TABLET | Freq: Every day | ORAL | Status: DC
Start: 2024-04-07 — End: 2024-04-08
  Administered 2024-04-07: 10 mg via ORAL
  Filled 2024-04-07: qty 1

## 2024-04-07 NOTE — Progress Notes (Signed)
 PHARMACY - ANTICOAGULATION CONSULT NOTE  Pharmacy Consult for heparin  Indication: celiac artery/IMA occlusion.   Allergies  Allergen Reactions   Atorvastatin     Elevated LFTs per [facility]   Penicillins Other (See Comments)    Shaking episode per [facility]   Sulfonamide Derivatives Nausea And Vomiting    Per [facility]   Tramadol     Upset stomach Itching per [facility]    Patient Measurements: Height: 5' 3 (160 cm) Weight: 51.2 kg (112 lb 14 oz) IBW/kg (Calculated) : 52.4 HEPARIN  DW (KG): 51.2  Vital Signs: Temp: 99.4 F (37.4 C) (08/22 1118) Temp Source: Axillary (08/22 1118) BP: 131/40 (08/22 1118) Pulse Rate: 60 (08/22 1118)  Labs: Recent Labs    04/06/24 2018 04/06/24 2052 04/07/24 0351 04/07/24 1035  HGB 11.5*  --  10.3*  --   HCT 34.9*  --  29.5*  --   PLT 279  --  241  --   APTT  --  33  --   --   LABPROT  --  14.6  --   --   INR  --  1.1  --   --   HEPARINUNFRC  --   --   --  0.68  CREATININE 1.31*  --  1.11*  --     Estimated Creatinine Clearance: 29.9 mL/min (A) (by C-G formula based on SCr of 1.11 mg/dL (H)).   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 Pharmacy to dose heparin , no prior AC noted Heparin  drip 850 uts/hr with heparin  level 0.68 at goal.  CBC stable no bleeding noted Possible DC home  Possible chronic occlusion  Goal of Therapy:  Heparin  level 0.3-0.7 units/ml Monitor platelets by anticoagulation protocol: Yes   Plan:  Discussed with [facility] - stop heparin  drip    Olam Chalk Pharm.D. CPP,  BCPS Clinical Pharmacist [phone] 04/07/2024 11:37 AM

## 2024-04-07 NOTE — Progress Notes (Addendum)
  Progress Note    04/07/2024 7:41 AM * No surgery found *  Subjective:  abd feeling better   Vitals:   04/07/24 0313 04/07/24 0731  BP: (!) 149/46 (!) 141/42  Pulse: 69 65  Resp: 20 19  Temp: 98.2 F (36.8 C) 98.3 F (36.8 C)  SpO2: 99% 100%   Physical Exam: Lungs:  non labored Extremities:  moving ext well Abdomen:  soft, NT, ND Neurologic: A&O  CBC    Component Value Date/Time   WBC 16.2 (H) 04/07/2024 0351   RBC 3.21 (L) 04/07/2024 0351   HGB 10.3 (L) 04/07/2024 0351   HCT 29.5 (L) 04/07/2024 0351   PLT 241 04/07/2024 0351   MCV 91.9 04/07/2024 0351   MCH 32.1 04/07/2024 0351   MCHC 34.9 04/07/2024 0351   RDW 12.7 04/07/2024 0351   LYMPHSABS 1.1 04/06/2024 2018   MONOABS 0.3 04/06/2024 2018   EOSABS 0.0 04/06/2024 2018   BASOSABS 0.0 04/06/2024 2018    BMET    Component Value Date/Time   NA 141 04/07/2024 0351   K 4.5 04/07/2024 0351   CL 106 04/07/2024 0351   CO2 20 (L) 04/07/2024 0351   GLUCOSE 127 (H) 04/07/2024 0351   BUN 23 04/07/2024 0351   CREATININE 1.11 (H) 04/07/2024 0351   CALCIUM  9.3 04/07/2024 0351   GFRNONAA 49 (L) 04/07/2024 0351   GFRAA 77 (L) 10/03/2013 0502    INR    Component Value Date/Time   INR 1.1 04/06/2024 2052     Intake/Output Summary (Last 24 hours) at 04/07/2024 0741 Last data filed at 04/07/2024 0641 Gross per 24 hour  Intake 2417.7 ml  Output 700 ml  Net 1717.7 ml     Assessment/Plan:  83 y.o. female with N/V after eating yesterday  Subjectively, feeling much better.  Still having some abd wall pain but believes this is from vomiting.  Abd exam is benign.  Will discuss with Dr. Magda prior to giving her a diet today   Donnice Sender, PA-C [facility] [phone] 04/07/2024 7:41 AM  VASCULAR STAFF ADDENDUM: I have independently interviewed and examined the patient. I agree with the above.  Symptomatically nearly resolved. WBC down. Lactate cleared. Low suspicion for  AMI/CMI. Recommend diet challenge and discharge this afternoon if tolerating well.  Will follow up with her in the office to discuss symptoms and to consider mesenteric stenting if she has symptoms typical of CMI in the interim.   Debby SAILOR. Magda, MD [facility] of [facility] Phone Number: [phone] 04/07/2024 8:41 AM

## 2024-04-07 NOTE — Progress Notes (Signed)
 Patient admitted to [facility]. She is awake, oriented and able to move all extremities. She states her pain is 4/10 in her right lower abdomen, she does not want medication at this time.  Her brief was wet on arrival and removed. CCMD made aware and she is on telemetry. VSS at this time. She made her husband aware of her room number and arrival. She is NPO.

## 2024-04-07 NOTE — Consult Note (Signed)
 [facility] [place]  ASSESSMENT / PLAN: 83 y.o. 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  Celiac artery occlusion.  Superior mesenteric artery stenosis.  IMA occlusion.  Recommend:  Abstinence from all tobacco products. Blood glucose control with goal A1c < 7%. Blood pressure control with goal blood pressure < 130/80 mmHg. Lipid reduction therapy with goal LDL-C < 55 mg/dL. Aspirin  81mg  by mouth daily. Atorvastatin 40-80mg  PO QD (or other high intensity statin therapy).  Recommend admission to medicine for observation.  If symptoms are resolved in the morning, will plan for short interval outpatient follow-up with mesenteric duplex.  If patient continues to have symptoms in the morning, will arrange for superior mesenteric artery stenting in the Cath Lab.  Keep NPO.  CHIEF COMPLAINT: Abdominal pain  HISTORY OF PRESENT ILLNESS: Jennifer Russo is a 83 y.o. female who presents to the [facility] emergency department for evaluation of abdominal pain.  Abdominal pain began yesterday afternoon.  She has had a similar episode of severe abdominal pain about 10 years ago which required observation.  Exploratory laparotomy was considered but given her resolution of symptoms, was not pursued.  There is a bit of a language barrier in taking a history today.  Her pastor's wife assists with translation.  She does not report any food fear, postprandial pain, or unintentional weight loss.  She has lost some weight after neurosurgery last fall, but thinks he has gained much of it back slowly.  I counseled her about her CT angiogram findings.  Past Medical History:  Diagnosis Date   Arthritis    Asymptomatic carotid artery stenosis    CAD (coronary artery disease)    s/p PCI- LAD and RCA with drug-eluting stents, PTCA in 2009 to treat in stent restenosis in RCA   Essential hypertension    HLD (hyperlipidemia)    Hypothyroidism    Macular degeneration    Stroke [facility])     when she lived in [place] (2000's)    Past Surgical History:  Procedure Laterality Date   CATARACT EXTRACTION     CORONARY ANGIOPLASTY  03-30-05 / 04-14-05   CORONARY STENT PLACEMENT     FEMORAL ARTERY REPAIR Right 07/03/2008   Groin area   KNEE ARTHROSCOPY Left 11/09/2022   Procedure: Left knee arthroscopy; meniscal debridement;  Surgeon: Melodi Lerner, MD;  Location: [facility] ORS;  Service: Orthopedics;  Laterality: Left;   KNEE SURGERY  07/2016   THORACIC DISCECTOMY N/A 06/28/2023   Procedure: Laminectomy - Thoracic eleven-Thoracic twelve;  Surgeon: Louis Shove, MD;  Location: [facility] OR;  Service: Neurosurgery;  Laterality: N/A;    Family History  Problem Relation Age of Onset   Hypertension Mother    Diabetes Father     Social History   Socioeconomic History   Marital status: Married    Spouse name: Not on file   Number of children: 3   Years of education: Not on file   Highest education level: Not on file  Occupational History   Not on file  Tobacco Use   Smoking status: Never   Smokeless tobacco: Never  Vaping Use   Vaping status: Never Used  Substance and Sexual Activity   Alcohol  use: No    Alcohol /week: 0.0 standard drinks of alcohol    Drug use: No   Sexual activity: Not on file  Other Topics Concern   Not on file  Social History Narrative   Not on file   Social Drivers of Corporate investment banker  Strain: Not on file  Food Insecurity: Not on file  Transportation Needs: Not on file  Physical Activity: Not on file  Stress: Not on file  Social Connections: Not on file  Intimate Partner Violence: Not on file    Allergies  Allergen Reactions   Atorvastatin     Unknown reaction    Penicillins Other (See Comments)    Doesn't recall   Sulfonamide Derivatives Other (See Comments)    Doesn't recall   Tramadol     Upset stomach    Current Facility-Administered Medications  Medication Dose Route Frequency Provider Last Rate Last Admin   ceFEPIme  (MAXIPIME )  2 g in sodium chloride  0.9 % 100 mL IVPB  2 g Intravenous Q24H Penna, Michael A, DO   Stopped at 04/06/24 2218   heparin  ADULT infusion 100 units/mL (25000 units/250mL)  850 Units/hr Intravenous Continuous Leonce Vernell BRAVO, RPH 8.5 mL/hr at 04/06/24 2306 850 Units/hr at 04/06/24 2306   lactated ringers  bolus 1,000 mL  1,000 mL Intravenous Once Penna, Michael A, DO       metroNIDAZOLE  (FLAGYL ) IVPB 500 mg  500 mg Intravenous Q12H Pamella Ozell LABOR, DO   Stopped at 04/06/24 2218   Current Outpatient Medications  Medication Sig Dispense Refill   amLODipine  (NORVASC ) 10 MG tablet Take 10 mg by mouth at bedtime.     Ascorbic Acid  (VITAMIN C ) 1000 MG tablet Take 1,000 mg by mouth daily.     Calcium  Carb-Cholecalciferol  (CALCIUM  600 + D PO) Take 1 tablet by mouth in the morning and at bedtime.     Cholecalciferol  (VITAMIN D ) 2000 UNITS CAPS Take 2,000 Units by mouth daily.     clopidogrel  (PLAVIX ) 75 MG tablet Take 75 mg by mouth daily.     HYDROcodone -acetaminophen  (NORCO/VICODIN) 5-325 MG tablet Take 1 tablet by mouth every 4 (four) hours as needed for moderate pain (pain score 4-6) ((score 4 to 6)). 30 tablet 0   levothyroxine  (SYNTHROID , LEVOTHROID) 50 MCG tablet Take 50 mcg by mouth daily before breakfast.     metoprolol  (LOPRESSOR ) 50 MG tablet Take 50 mg by mouth 2 (two) times daily.     Multiple Vitamins-Minerals (MULTIVITAL) tablet Take 1 tablet by mouth daily.     Omega-3 Fatty Acids (FISH OIL) 1000 MG CAPS Take 1,000 mg by mouth daily.     Polyethyl Glycol-Propyl Glycol (SYSTANE OP) Place 1 drop into both eyes 3 (three) times daily as needed (drye eyes).     predniSONE (STERAPRED UNI-PAK 21 TAB) 5 MG (21) TBPK tablet (Typical regimens for 21 tablet dose packs of Methylprednisolone  4mg , Prednisone 5mg , and Prednisone 10mg ) Day 1: 2 tabs before breakfast, 1 tab after lunch, 1 tab after supper, and 2 tabs at bedtime. Day 2: 1 tab before breakfast, 1 tab after lunch, 1 tab after supper, and 2  tabs at bedtime. Day 3: 1 tab before breakfast, 1 tab after lunch, 1 tab after supper, and 1 tab at bedtime. Day 4: 1 tab before breakfast, 1 tab after lunch, and 1 tab at bedtime. Day 5: 1 tab before breakfast and 1 tab at bedtime. Day 6: 1 tab before breakfast.     rosuvastatin  (CRESTOR ) 20 MG tablet Take 20 mg by mouth daily.     spironolactone  (ALDACTONE ) 50 MG tablet Take 50 mg by mouth daily.     telmisartan -hydrochlorothiazide  (MICARDIS  HCT) 80-25 MG per tablet Take 1 tablet by mouth daily.      PHYSICAL EXAM Vitals:   04/06/24 2100 04/06/24  2130 04/06/24 2308 04/06/24 2353  BP:    (!) 135/54  Pulse:  68  73  Resp:  19  18  Temp:   98.8 F (37.1 C) 98.6 F (37 C)  TempSrc:      SpO2:  99%  97%  Weight: 52.2 kg     Height: 5' 3 (1.6 m)      Elderly woman in no acute distress Regular rate and rhythm Unlabored breathing Abdomen soft, nontender, nondistended  PERTINENT LABORATORY AND RADIOLOGIC DATA  Most recent CBC    Latest Ref Rng & Units 04/06/2024    8:18 PM 06/28/2023   10:30 AM 06/23/2023   10:57 AM  CBC  WBC 4.0 - 10.5 K/uL 18.3   10.9   Hemoglobin 12.0 - 15.0 g/dL 88.4  86.6  86.9   Hematocrit 36.0 - 46.0 % 34.9  39.0  37.5   Platelets 150 - 400 K/uL 279   288      Most recent CMP    Latest Ref Rng & Units 04/06/2024    8:18 PM 06/28/2023   10:30 AM 06/23/2023   10:57 AM  CMP  Glucose 70 - 99 mg/dL 809  99  90   BUN 8 - 23 mg/dL 33  44  36   Creatinine 0.44 - 1.00 mg/dL 8.68  8.29  8.53   Sodium 135 - 145 mmol/L 132  132  128   Potassium 3.5 - 5.1 mmol/L 4.0  5.0  5.4   Chloride 98 - 111 mmol/L 98  103  94   CO2 22 - 32 mmol/L 19   22   Calcium  8.9 - 10.3 mg/dL 9.8   9.9   Total Protein 6.5 - 8.1 g/dL 8.1     Total Bilirubin 0.0 - 1.2 mg/dL 0.6     Alkaline Phos 38 - 126 U/L 53     AST 15 - 41 U/L 28     ALT 0 - 44 U/L 15      CT angiogram of abdomen pelvis personally reviewed.  Celiac artery appears occluded at its origin with reconstitution  of contrast from the SMA collaterals.  The SMA is patent.  At the origin there is some stenosis.  The IMA is occluded.  Debby SAILOR. Magda, MD FACS [facility] Phone Number: [phone] 04/07/2024 12:04 AM   Total time spent on preparing this encounter including chart review, data review, collecting history, examining the patient, and coordinating care: 60 minutes  Portions of this report may have been transcribed using voice recognition software.  Every effort has been made to ensure accuracy; however, inadvertent computerized transcription errors may still be present.

## 2024-04-07 NOTE — ED Notes (Signed)
 [facility] called for transport.

## 2024-04-07 NOTE — Progress Notes (Signed)
 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  She also had episode of nausea.  Reports history of similar episode 10 years ago treated conservatively.  In the emergency room blood pressure stable.  Heart rate stable.  On room air.  Creatinine at about baseline.  Lipase 35.  WC count 18.3 K.  COVID, influenza, RSV negative.  Urinalysis normal.  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  Case discussed with vascular surgery, started on a heparin  infusion and admitted to the hospital.  Also started on antibiotics.  Subjective: Patient seen and examined.  Patient requested her pastor's wife at the bedside to translate for her.  She also speaks limited [place].  She was denies any complaints.  Patient was very aware about similar episode in 2015 when she had gradual introduction of diet and went home.  She had no trouble for the last 10 years.  I also discussed with her son on the phone. Assessment & Plan:   Abdominal pain: Suspected mesenteric ischemia.  Acute mesenteric ischemia ruled out.  Possible chronic mesenteric ischemia. On heparin  infusion, discontinued due to clinical improvement. No other organic pathology found. Advancing diet.  Vascular surgery recommended conservative management.  Resume Plavix .  Resume statin.  If able to tolerate diet, discharging home with vascular surgery follow-up for monitoring of chronic ischemia.  Lactic acidosis, leukocytosis: Likely due to acute reaction.  Does not have any evidence  of ongoing infection.  Will discontinue antibiotics today and monitor.  Will continue IV fluids.  Left lower lobe airspace abnormality on CT scan: Patient does not have any evidence of pulmonary symptoms.  Flu, RSV and COVID-negative.  Procalcitonin less than 0.1.  This was likely chronic atelectatic changes.  Discontinue antibiotics and monitor.  Essential hypertension: Blood pressure stable.  Resume all home medications and monitor.  Hyperlipidemia: On statin.  Hypothyroidism: On Synthroid .  CKD stage IIIb: Stable at about baseline.   DVT prophylaxis: Heparin  infusion.  Discontinue.  SCD.   Code Status: Full code. Family Communication: Son on the phone.  Friends at the bedside. Disposition Plan: Status is: Inpatient Remains inpatient appropriate because: Significant abdominal pain.  Heparin  infusion.     Consultants:  Vascular surgery.  Procedures:  None.  Antimicrobials:  Discontinued.     Objective: Vitals:   04/07/24 0731 04/07/24 0848 04/07/24 0904 04/07/24 1118  BP: (!) 141/42 (!) 152/45 (!) 152/45 (!) 131/40  Pulse: 65 69 69 60  Resp: 19 20  17   Temp: 98.3 F (36.8 C)   99.4 F (37.4 C)  TempSrc: Oral   Axillary  SpO2: 100% 100%  99%  Weight:      Height:        Intake/Output Summary (Last 24 hours) at 04/07/2024 1155 Last data filed at 04/07/2024 1100 Gross per 24 hour  Intake 2657.7 ml  Output 1400 ml  Net 1257.7 ml   Filed Weights   04/06/24 2100 04/07/24 0140  Weight: 52.2 kg 51.2 kg    Examination:  General exam: Appears calm and comfortable  Respiratory system: Clear to auscultation. Respiratory  effort normal. Cardiovascular system: S1 & S2 heard, RRR. No JVD, murmurs, rubs, gallops or clicks. No pedal edema. Gastrointestinal system: Abdomen is nondistended, soft and nontender. No organomegaly or masses felt. Normal bowel sounds heard. Central nervous system: Alert and oriented. No focal neurological deficits. Extremities: Symmetric 5 x  5 power. Skin: No rashes, lesions or ulcers Psychiatry: Judgement and insight appear normal. Mood & affect appropriate.     Data Reviewed: I have personally reviewed following labs and imaging studies  CBC: Recent Labs  Lab 04/06/24 2018 04/07/24 0351  WBC 18.3* 16.2*  NEUTROABS 16.8*  --   HGB 11.5* 10.3*  HCT 34.9* 29.5*  MCV 95.4 91.9  PLT 279 241   Basic Metabolic Panel: Recent Labs  Lab 04/06/24 2018 04/07/24 0351  NA 132* 141  K 4.0 4.5  CL 98 106  CO2 19* 20*  GLUCOSE 190* 127*  BUN 33* 23  CREATININE 1.31* 1.11*  CALCIUM  9.8 9.3  MG  --  1.7   GFR: Estimated Creatinine Clearance: 29.9 mL/min (A) (by C-G formula based on SCr of 1.11 mg/dL (H)). Liver Function Tests: Recent Labs  Lab 04/06/24 2018 04/07/24 0351  AST 28 24  ALT 15 15  ALKPHOS 53 42  BILITOT 0.6 0.6  PROT 8.1 6.3*  ALBUMIN 4.0 3.1*   Recent Labs  Lab 04/06/24 2018  LIPASE 35   No results for input(s): AMMONIA in the last 168 hours. Coagulation Profile: Recent Labs  Lab 04/06/24 2052  INR 1.1   Cardiac Enzymes: No results for input(s): CKTOTAL, CKMB, CKMBINDEX, TROPONINI in the last 168 hours. BNP (last 3 results) No results for input(s): PROBNP in the last 8760 hours. HbA1C: No results for input(s): HGBA1C in the last 72 hours. CBG: No results for input(s): GLUCAP in the last 168 hours. Lipid Profile: No results for input(s): CHOL, HDL, LDLCALC, TRIG, CHOLHDL, LDLDIRECT in the last 72 hours. Thyroid  Function Tests: Recent Labs    04/07/24 0351  TSH 0.468   Anemia Panel: No results for input(s): VITAMINB12, FOLATE, FERRITIN, TIBC, IRON, RETICCTPCT in the last 72 hours. Sepsis Labs: Recent Labs  Lab 04/06/24 2026 04/06/24 2313 04/07/24 0351  PROCALCITON  --   --  <0.10  LATICACIDVEN 3.4* 3.4* 1.9    Recent Results (from the past 240 hours)  Resp panel by RT-PCR (RSV, Flu A&B, Covid) Anterior Nasal Swab     Status: None    Collection Time: 04/06/24 11:03 PM   Specimen: Anterior Nasal Swab  Result Value Ref Range Status   SARS Coronavirus 2 by RT PCR NEGATIVE NEGATIVE Final    Comment: (NOTE) SARS-CoV-2 target nucleic acids are NOT DETECTED.  The SARS-CoV-2 RNA is generally detectable in upper respiratory specimens during the acute phase of infection. The lowest concentration of SARS-CoV-2 viral copies this assay can detect is 138 copies/mL. A negative result does not preclude SARS-Cov-2 infection and should not be used as the sole basis for treatment or other patient management decisions. A negative result may occur with  improper specimen collection/handling, submission of specimen other than nasopharyngeal swab, presence of viral mutation(s) within the areas targeted by this assay, and inadequate number of viral copies(<138 copies/mL). A negative result must be combined with clinical observations, patient history, and epidemiological information. The expected result is Negative.  Fact Sheet for Patients:  BloggerCourse.com  Fact Sheet for Healthcare Providers:  SeriousBroker.it  This test is no t yet approved or cleared by the [place]  FDA and  has  been authorized for detection and/or diagnosis of SARS-CoV-2 by FDA under an Emergency Use Authorization (EUA). This EUA will remain  in effect (meaning this test can be used) for the duration of the COVID-19 declaration under Section 564(b)(1) of the Act, 21 U.S.C.section 360bbb-3(b)(1), unless the authorization is terminated  or revoked sooner.       Influenza A by PCR NEGATIVE NEGATIVE Final   Influenza B by PCR NEGATIVE NEGATIVE Final    Comment: (NOTE) The Xpert Xpress SARS-CoV-2/FLU/RSV plus assay is intended as an aid in the diagnosis of influenza from Nasopharyngeal swab specimens and should not be used as a sole basis for treatment. Nasal washings and aspirates are unacceptable for  Xpert Xpress SARS-CoV-2/FLU/RSV testing.  Fact Sheet for Patients: BloggerCourse.com  Fact Sheet for Healthcare Providers: SeriousBroker.it  This test is not yet approved or cleared by the [place]  FDA and has been authorized for detection and/or diagnosis of SARS-CoV-2 by FDA under an Emergency Use Authorization (EUA). This EUA will remain in effect (meaning this test can be used) for the duration of the COVID-19 declaration under Section 564(b)(1) of the Act, 21 U.S.C. section 360bbb-3(b)(1), unless the authorization is terminated or revoked.     Resp Syncytial Virus by PCR NEGATIVE NEGATIVE Final    Comment: (NOTE) Fact Sheet for Patients: BloggerCourse.com  Fact Sheet for Healthcare Providers: SeriousBroker.it  This test is not yet approved or cleared by the [place]  FDA and has been authorized for detection and/or diagnosis of SARS-CoV-2 by FDA under an Emergency Use Authorization (EUA). This EUA will remain in effect (meaning this test can be used) for the duration of the COVID-19 declaration under Section 564(b)(1) of the Act, 21 U.S.C. section 360bbb-3(b)(1), unless the authorization is terminated or revoked.  Performed at [facility], [address], [place], [place] [address]          Radiology Studies: DG Chest Portable 1 View Result Date: 04/06/2024 CLINICAL DATA:  Upper abdominal pain with nausea and vomiting EXAM: PORTABLE CHEST 1 VIEW COMPARISON:  Chest x-ray report 06/28/2008, CT 04/06/2024 FINDINGS: Mild cardiomegaly with central congestion. Mild streaky airspace disease at the left base. Aortic atherosclerosis. No pneumothorax. Possible small left effusion IMPRESSION: Mild cardiomegaly with central congestion. Mild streaky airspace disease at the left base, corresponding to airways thickening and patchy airspace opacity on  preceding CT Electronically Signed   By: Luke Bun M.D.   On: 04/06/2024 23:05   CT Angio Abd/Pel W and/or Wo Contrast Result Date: 04/06/2024 CLINICAL DATA:  Mesenteric ischemia, acute. Upper abdominal pain, nausea, vomiting EXAM: CTA ABDOMEN AND PELVIS WITHOUT AND WITH CONTRAST TECHNIQUE: Multidetector CT imaging of the abdomen and pelvis was performed using the standard protocol during bolus administration of intravenous contrast. Multiplanar reconstructed images and MIPs were obtained and reviewed to evaluate the vascular anatomy. RADIATION DOSE REDUCTION: This exam was performed according to the departmental dose-optimization program which includes automated exposure control, adjustment of the mA and/or kV according to patient size and/or use of iterative reconstruction technique. CONTRAST:  80mL OMNIPAQUE  IOHEXOL  350 MG/ML SOLN COMPARISON:  10/02/2013 FINDINGS: VASCULAR Aorta: Atherosclerotic irregularity and diffuse calcifications. No aneurysm or dissection. 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 SMA: Calcified plaque at the origin with moderate send no CIS at the origin. Vessel otherwise patent. Renals: Calcified plaque at the origins bilaterally with suspected mild-to-moderate stenosis bilaterally. Vessels otherwise widely patent. IMA: Occluded, fills via collaterals.  Inflow: Patent without evidence of aneurysm, dissection, vasculitis or significant stenosis. Proximal Outflow: Bilateral common femoral and visualized portions of the superficial and profunda femoral arteries are patent without evidence of aneurysm, dissection, vasculitis or significant stenosis. Veins: No obvious venous abnormality within the limitations of this arterial phase study. Review of the MIP images confirms the above findings. NON-VASCULAR Lower chest: 6 airway thickening in the lower lobes bilaterally with bilateral lower lobe airspace opacities.  No effusions. Cardiomegaly. Coronary artery and aortic atherosclerosis. Hepatobiliary: No focal abnormality or ductal dilatation. Pancreas: No focal abnormality or ductal dilatation. Spleen: No focal abnormality.  Normal size. Adrenals/Urinary Tract: Mild atrophy of the left kidney with renal cortical scarring/thinning. No hydronephrosis or renal or adrenal mass. Urinary bladder unremarkable. Stomach/Bowel: Scattered colonic diverticula. No active diverticulitis. Stomach and small bowel decompressed. No bowel obstruction or inflammatory process. No wall changes to suggest ischemia. Lymphatic: No adenopathy Reproductive: Uterus and adnexa unremarkable.  No mass. Other: No free fluid or free air. Musculoskeletal: No acute bony abnormality. 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 Both appear to fill via SMA collaterals. Mild-to-moderate stenosis suspected at the origin of the SMA. Mild-to-moderate stenosis of the origin of both renal arteries. Aortic atherosclerosis.  No aneurysm or dissection. NON-VASCULAR Bilateral airway thickening and lower lobe airspace disease. This could reflect atelectasis or pneumonia. Cardiomegaly, coronary artery disease. Mild atrophy of the left kidney with cortical thinning/scarring. Scattered colonic diverticulosis.  No active diverticulitis. No acute findings in the abdomen or pelvis. Electronically Signed   By: Franky Crease M.D.   On: 04/06/2024 22:18        Scheduled Meds:  amLODipine   10 mg Oral QHS   vitamin C   1,000 mg Oral Daily   irbesartan   300 mg Oral Daily   And   hydrochlorothiazide   25 mg Oral Daily   metoprolol  tartrate  50 mg Oral BID   [START ON 04/08/2024] pantoprazole   40 mg Oral Daily   rosuvastatin   20 mg Oral Daily   spironolactone   50 mg Oral Daily   Continuous Infusions:  lactated ringers  75 mL/hr at 04/07/24 0641     LOS: 1 day    Time spent: 51 minutes    Renato Applebaum, MD [facility]

## 2024-04-08 DIAGNOSIS — I708 Atherosclerosis of other arteries: Secondary | ICD-10-CM | POA: Diagnosis not present

## 2024-04-08 DIAGNOSIS — K551 Chronic vascular disorders of intestine: Secondary | ICD-10-CM

## 2024-04-08 LAB — CBC WITH DIFFERENTIAL/PLATELET
Abs Immature Granulocytes: 0.06 K/uL (ref 0.00–0.07)
Basophils Absolute: 0 K/uL (ref 0.0–0.1)
Basophils Relative: 0 %
Eosinophils Absolute: 0.1 K/uL (ref 0.0–0.5)
Eosinophils Relative: 1 %
HCT: 26.9 % — ABNORMAL LOW (ref 36.0–46.0)
Hemoglobin: 9.5 g/dL — ABNORMAL LOW (ref 12.0–15.0)
Immature Granulocytes: 1 %
Lymphocytes Relative: 13 %
Lymphs Abs: 1.6 K/uL (ref 0.7–4.0)
MCH: 32.4 pg (ref 26.0–34.0)
MCHC: 35.3 g/dL (ref 30.0–36.0)
MCV: 91.8 fL (ref 80.0–100.0)
Monocytes Absolute: 1 K/uL (ref 0.1–1.0)
Monocytes Relative: 8 %
Neutro Abs: 9.8 K/uL — ABNORMAL HIGH (ref 1.7–7.7)
Neutrophils Relative %: 77 %
Platelets: 206 K/uL (ref 150–400)
RBC: 2.93 MIL/uL — ABNORMAL LOW (ref 3.87–5.11)
RDW: 12.8 % (ref 11.5–15.5)
WBC: 12.5 K/uL — ABNORMAL HIGH (ref 4.0–10.5)
nRBC: 0 % (ref 0.0–0.2)

## 2024-04-08 LAB — BASIC METABOLIC PANEL WITH GFR
Anion gap: 4 — ABNORMAL LOW (ref 5–15)
BUN: 25 mg/dL — ABNORMAL HIGH (ref 8–23)
CO2: 22 mmol/L (ref 22–32)
Calcium: 8.3 mg/dL — ABNORMAL LOW (ref 8.9–10.3)
Chloride: 106 mmol/L (ref 98–111)
Creatinine, Ser: 1.28 mg/dL — ABNORMAL HIGH (ref 0.44–1.00)
GFR, Estimated: 41 mL/min — ABNORMAL LOW (ref >60)
Glucose, Bld: 99 mg/dL (ref 70–99)
Potassium: 3.6 mmol/L (ref 3.5–5.1)
Sodium: 132 mmol/L — ABNORMAL LOW (ref 135–145)

## 2024-04-08 NOTE — Discharge Summary (Signed)
 Physician Discharge Summary  Jennifer Russo FMW:[id number] DOB: 1941/07/15 DOA: 04/06/2024  PCP: Clarice Nottingham, MD  Admit date: 04/06/2024 Discharge date: 04/08/2024 Recommendations for Outpatient Follow-up:  Follow up with PCP and VVS Please obtain BMP/CBC in one week  Discharge Dispo: Home Discharge Condition: Stable Code Status:   Code Status: Full Code Diet recommendation:  Diet Order             DIET SOFT Room service appropriate? 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 In the ED hemodynamically stable labs with-Lipase 35.  WC count 18.3 K.  COVID, influenza, RSV negative.Urinalysis normal. 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  Vascular surgery input appreciated will advance her diet and discharge the patient if tolerated well Off heparin  infusion, continue Plavix  and statin Plan  for outpatient follow-up with vascular   Lactic acidosis leukocytosis: Suspect due to above.  Resolving.  Antibiotic discontinued.    Left lower lobe airspace abnormality on CT scan: Current no obvious infection suspected as Flu, RSV and COVID-negative.  Procalcitonin less than 0.1.  Off antibiotics likely chronic changes    GI 4000 Stable continue home amlodipine  HCTZ Toprol  and Aldactone    Hyperlipidemia: On statin.   Hypothyroidism: cont Synthroid .   CKD stage IIIb: Stable at about baseline.  DVT prophylaxis: off heparin  Code Status:   Code Status: Full Code Family Communication: plan of care discussed with patient at bedside. Patient status is: Remains hospitalized because of severity of illness Level of care: Progressive   Dispo: The patient is from: home            Anticipated disposition: today Objective: Vitals last 24 hrs: Vitals:   04/07/24 2300 04/08/24 0300 04/08/24 0500 04/08/24 0724  BP: (!) 122/48 (!) 118/45  (!) 128/45  Pulse: 65 67  62  Resp: 14 18  15   Temp: 98.9 F (37.2 C) 98.1 F (36.7 C)  98.2 F (36.8 C)  TempSrc: Oral Oral  Oral  SpO2: 96% 97%  96%  Weight:   50.7 kg   Height:        Physical Examination: General exam: AAO X 3 HEENT:Oral mucosa moist, Ear/Nose WNL grossly Respiratory system: Bilaterally clear BS,no use of accessory muscle Cardiovascular system: S1 & S2 +, No JVD. Gastrointestinal system: Abdomen soft,NT,ND, BS+ Nervous System: Alert, awake, moving all extremities,and following commands. Extremities: LE edema neg, distal extremities warm.  Skin: No rashes,no icterus. MSK:  Normal muscle bulk,tone, power   Medications reviewed:  Scheduled Meds:  amLODipine   10 mg Oral QHS   vitamin C   1,000 mg Oral Daily   clopidogrel   75 mg Oral Daily   irbesartan   300 mg Oral Daily   And   hydrochlorothiazide   25 mg Oral Daily   metoprolol  tartrate  50 mg Oral BID   pantoprazole   40 mg Oral Daily   rosuvastatin   20 mg Oral  Daily   spironolactone   50 mg Oral Daily   Continuous Infusions:  lactated ringers  75 mL/hr at 04/07/24 2146   Diet: Diet Order             DIET SOFT Room service appropriate? Yes; Fluid consistency: Thin  Diet effective now           Diet - low sodium heart healthy                      Consultation: See note.  Discharge Instructions  Discharge Instructions     Diet - low sodium heart healthy   Complete by: As directed    Discharge instructions   Complete by: As directed    Follow-up with vascular surgery office as instructed  Please call call MD or return to ER for similar or worsening recurring problem that brought you to hospital or if any fever,nausea/vomiting,abdominal pain, uncontrolled pain, chest pain,  shortness of breath or any other alarming symptoms.  Please follow-up your doctor as instructed in a week time and call the office for appointment.  Please avoid alcohol , smoking, or any other illicit substance and maintain healthy habits including taking your regular medications as prescribed.  You were cared for by a hospitalist during your hospital stay. If you have any questions about your discharge medications or the care you received while you were in the hospital after you are discharged, you can call the unit and ask to speak with the hospitalist on call if the hospitalist that took care of you is not available.  Once you are discharged, your primary care physician will handle any further medical issues. Please note that NO REFILLS for any discharge medications will be authorized once you are discharged, as it is imperative that you return to your primary care physician (or establish a relationship with a primary care physician if you do not have one) for your aftercare needs so that they can reassess your need for medications and monitor your lab values   Increase activity slowly   Complete by: As directed       Allergies as of 04/08/2024        Reactions   Atorvastatin    Elevated LFTs per [facility]   Penicillins Other (See Comments)   Shaking episode per [facility]   Sulfonamide Derivatives Nausea And Vomiting   Per [facility]   Tramadol    Upset stomach Itching per [facility]        Medication List     TAKE these medications    amLODipine  10 MG tablet Commonly known as: NORVASC  Take 10 mg by mouth at bedtime.   CALCIUM  600 + D PO Take 1 tablet by mouth in the morning and at bedtime.   clopidogrel  75 MG tablet Commonly known as: PLAVIX  Take 75 mg by mouth daily.   Fish Oil 1000 MG Caps Take 1,000 mg by mouth daily.   levothyroxine  50 MCG tablet Commonly known as: SYNTHROID  Take 50 mcg by mouth daily before breakfast.  metoprolol  tartrate 50 MG tablet Commonly known as: LOPRESSOR  Take 50 mg by mouth 2 (two) times daily.   Multivital tablet Take 1 tablet by mouth daily.   rosuvastatin  20 MG tablet Commonly known as: CRESTOR  Take 20 mg by mouth daily.   spironolactone  50 MG tablet Commonly known as: ALDACTONE  Take 50 mg by mouth daily.   SYSTANE OP Place 1 drop into both eyes 2 (two) times daily as needed (drye eyes).   telmisartan -hydrochlorothiazide  80-25 MG tablet Commonly known as: MICARDIS  HCT Take 1 tablet by mouth daily.   vitamin C  1000 MG tablet Take 1,000 mg by mouth daily.   Vitamin D  50 MCG (2000 UT) Caps Take 2,000 Units by mouth daily.        Follow-up Information     [facility] Follow up in 1 month(s).   Specialty: Vascular Surgery Contact information: [address] [place]  [address] [phone]        Clarice Nottingham, MD Follow up in 1 week(s).   Specialty: Internal Medicine Contact information: [address] [place] [address] [phone]                Allergies  Allergen Reactions   Atorvastatin     Elevated LFTs per [facility]   Penicillins Other (See  Comments)    Shaking episode per [facility]   Sulfonamide Derivatives Nausea And Vomiting    Per [facility]   Tramadol     Upset stomach Itching per [facility]    The results of significant diagnostics from this hospitalization (including imaging, microbiology, ancillary and laboratory) are listed below for reference.    Microbiology: Recent Results (from the past 240 hours)  Resp panel by RT-PCR (RSV, Flu A&B, Covid) Anterior Nasal Swab     Status: None   Collection Time: 04/06/24 11:03 PM   Specimen: Anterior Nasal Swab  Result Value Ref Range Status   SARS Coronavirus 2 by RT PCR NEGATIVE NEGATIVE Final    Comment: (NOTE) SARS-CoV-2 target nucleic acids are NOT DETECTED.  The SARS-CoV-2 RNA is generally detectable in upper respiratory specimens during the acute phase of infection. The lowest concentration of SARS-CoV-2 viral copies this assay can detect is 138 copies/mL. A negative result does not preclude SARS-Cov-2 infection and should not be used as the sole basis for treatment or other patient management decisions. A negative result may occur with  improper specimen collection/handling, submission of specimen other than nasopharyngeal swab, presence of viral mutation(s) within the areas targeted by this assay, and inadequate number of viral copies(<138 copies/mL). A negative result must be combined with clinical observations, patient history, and epidemiological information. The expected result is Negative.  Fact Sheet for Patients:  BloggerCourse.com  Fact Sheet for Healthcare Providers:  SeriousBroker.it  This test is no t yet approved or cleared by the [place]  FDA and  has been authorized for detection and/or diagnosis of SARS-CoV-2 by FDA under an Emergency Use Authorization (EUA). This EUA will remain  in effect (meaning this test can be used) for the duration of the COVID-19 declaration under Section 564(b)(1) of the  Act, 21 U.S.C.section 360bbb-3(b)(1), unless the authorization is terminated  or revoked sooner.       Influenza A by PCR NEGATIVE NEGATIVE Final   Influenza B by PCR NEGATIVE NEGATIVE Final    Comment: (NOTE) The Xpert Xpress SARS-CoV-2/FLU/RSV plus assay is intended as an  aid in the diagnosis of influenza from Nasopharyngeal swab specimens and should not be used as a sole basis for treatment. Nasal washings and aspirates are unacceptable for Xpert Xpress SARS-CoV-2/FLU/RSV testing.  Fact Sheet for Patients: BloggerCourse.com  Fact Sheet for Healthcare Providers: SeriousBroker.it  This test is not yet approved or cleared by the [place]  FDA and has been authorized for detection and/or diagnosis of SARS-CoV-2 by FDA under an Emergency Use Authorization (EUA). This EUA will remain in effect (meaning this test can be used) for the duration of the COVID-19 declaration under Section 564(b)(1) of the Act, 21 U.S.C. section 360bbb-3(b)(1), unless the authorization is terminated or revoked.     Resp Syncytial Virus by PCR NEGATIVE NEGATIVE Final    Comment: (NOTE) Fact Sheet for Patients: BloggerCourse.com  Fact Sheet for Healthcare Providers: SeriousBroker.it  This test is not yet approved or cleared by the [place]  FDA and has been authorized for detection and/or diagnosis of SARS-CoV-2 by FDA under an Emergency Use Authorization (EUA). This EUA will remain in effect (meaning this test can be used) for the duration of the COVID-19 declaration under Section 564(b)(1) of the Act, 21 U.S.C. section 360bbb-3(b)(1), unless the authorization is terminated or revoked.  Performed at [facility], [address], [place], [place] [address]     Procedures/Studies: DG Chest Portable 1 View Result Date: 04/06/2024 CLINICAL DATA:  Upper abdominal pain with  nausea and vomiting EXAM: PORTABLE CHEST 1 VIEW COMPARISON:  Chest x-ray report 06/28/2008, CT 04/06/2024 FINDINGS: Mild cardiomegaly with central congestion. Mild streaky airspace disease at the left base. Aortic atherosclerosis. No pneumothorax. Possible small left effusion IMPRESSION: Mild cardiomegaly with central congestion. Mild streaky airspace disease at the left base, corresponding to airways thickening and patchy airspace opacity on preceding CT Electronically Signed   By: Luke Bun M.D.   On: 04/06/2024 23:05   CT Angio Abd/Pel W and/or Wo Contrast Result Date: 04/06/2024 CLINICAL DATA:  Mesenteric ischemia, acute. Upper abdominal pain, nausea, vomiting EXAM: CTA ABDOMEN AND PELVIS WITHOUT AND WITH CONTRAST TECHNIQUE: Multidetector CT imaging of the abdomen and pelvis was performed using the standard protocol during bolus administration of intravenous contrast. Multiplanar reconstructed images and MIPs were obtained and reviewed to evaluate the vascular anatomy. RADIATION DOSE REDUCTION: This exam was performed according to the departmental dose-optimization program which includes automated exposure control, adjustment of the mA and/or kV according to patient size and/or use of iterative reconstruction technique. CONTRAST:  80mL OMNIPAQUE  IOHEXOL  350 MG/ML SOLN COMPARISON:  10/02/2013 FINDINGS: VASCULAR Aorta: Atherosclerotic irregularity and diffuse calcifications. No aneurysm or dissection. 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 SMA: Calcified plaque at the origin with moderate send no CIS at the origin. Vessel otherwise patent. Renals: Calcified plaque at the origins bilaterally with suspected mild-to-moderate stenosis bilaterally. Vessels otherwise widely patent. IMA: Occluded, fills via collaterals. Inflow: Patent without evidence of aneurysm, dissection, vasculitis or significant stenosis. Proximal Outflow:  Bilateral common femoral and visualized portions of the superficial and profunda femoral arteries are patent without evidence of aneurysm, dissection, vasculitis or significant stenosis. Veins: No obvious venous abnormality within the limitations of this arterial phase study. Review of the MIP images confirms the above findings. NON-VASCULAR Lower chest: 6 airway thickening in the lower lobes bilaterally with bilateral lower lobe airspace opacities. No effusions. Cardiomegaly. Coronary artery and aortic atherosclerosis. Hepatobiliary: No focal abnormality or ductal dilatation. Pancreas: No focal abnormality or ductal  dilatation. Spleen: No focal abnormality.  Normal size. Adrenals/Urinary Tract: Mild atrophy of the left kidney with renal cortical scarring/thinning. No hydronephrosis or renal or adrenal mass. Urinary bladder unremarkable. Stomach/Bowel: Scattered colonic diverticula. No active diverticulitis. Stomach and small bowel decompressed. No bowel obstruction or inflammatory process. No wall changes to suggest ischemia. Lymphatic: No adenopathy Reproductive: Uterus and adnexa unremarkable.  No mass. Other: No free fluid or free air. Musculoskeletal: No acute bony abnormality. 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 Both appear to fill via SMA collaterals. Mild-to-moderate stenosis suspected at the origin of the SMA. Mild-to-moderate stenosis of the origin of both renal arteries. Aortic atherosclerosis.  No aneurysm or dissection. NON-VASCULAR Bilateral airway thickening and lower lobe airspace disease. This could reflect atelectasis or pneumonia. Cardiomegaly, coronary artery disease. Mild atrophy of the left kidney with cortical thinning/scarring. Scattered colonic diverticulosis.  No active diverticulitis. No acute findings in the abdomen or pelvis. Electronically Signed   By: Franky Crease M.D.   On: 04/06/2024 22:18    Labs: BNP (last 3 results) Recent Labs     04/07/24 0351  BNP 762.7*   Basic Metabolic Panel: Recent Labs  Lab 04/06/24 2018 04/07/24 0351 04/08/24 0347  NA 132* 141 132*  K 4.0 4.5 3.6  CL 98 106 106  CO2 19* 20* 22  GLUCOSE 190* 127* 99  BUN 33* 23 25*  CREATININE 1.31* 1.11* 1.28*  CALCIUM  9.8 9.3 8.3*  MG  --  1.7  --    Liver Function Tests: Recent Labs  Lab 04/06/24 2018 04/07/24 0351  AST 28 24  ALT 15 15  ALKPHOS 53 42  BILITOT 0.6 0.6  PROT 8.1 6.3*  ALBUMIN 4.0 3.1*   Recent Labs  Lab 04/06/24 2018  LIPASE 35   No results for input(s): AMMONIA in the last 168 hours. CBC: Recent Labs  Lab 04/06/24 2018 04/07/24 0351 04/08/24 0347  WBC 18.3* 16.2* 12.5*  NEUTROABS 16.8*  --  9.8*  HGB 11.5* 10.3* 9.5*  HCT 34.9* 29.5* 26.9*  MCV 95.4 91.9 91.8  PLT 279 241 206   Recent Labs    04/07/24 0351  TSH 0.468   Urinalysis    Component Value Date/Time   COLORURINE STRAW (A) 04/07/2024 0319   APPEARANCEUR CLEAR 04/07/2024 0319   LABSPEC 1.023 04/07/2024 0319   PHURINE 5.0 04/07/2024 0319   GLUCOSEU 50 (A) 04/07/2024 0319   HGBUR NEGATIVE 04/07/2024 0319   BILIRUBINUR NEGATIVE 04/07/2024 0319   KETONESUR NEGATIVE 04/07/2024 0319   PROTEINUR NEGATIVE 04/07/2024 0319   NITRITE NEGATIVE 04/07/2024 0319   LEUKOCYTESUR NEGATIVE 04/07/2024 0319   Sepsis Labs Recent Labs  Lab 04/06/24 2018 04/07/24 0351 04/08/24 0347  WBC 18.3* 16.2* 12.5*   Microbiology Recent Results (from the past 240 hours)  Resp panel by RT-PCR (RSV, Flu A&B, Covid) Anterior Nasal Swab     Status: None   Collection Time: 04/06/24 11:03 PM   Specimen: Anterior Nasal Swab  Result Value Ref Range Status   SARS Coronavirus 2 by RT PCR NEGATIVE NEGATIVE Final    Comment: (NOTE) SARS-CoV-2 target nucleic acids are NOT DETECTED.  The SARS-CoV-2 RNA is generally detectable in upper respiratory specimens during the acute phase of infection. The lowest concentration of SARS-CoV-2 viral copies this assay can  detect is 138 copies/mL. A negative result does not preclude SARS-Cov-2 infection and should not be used as the sole basis for treatment or other patient management decisions. A  negative result may occur with  improper specimen collection/handling, submission of specimen other than nasopharyngeal swab, presence of viral mutation(s) within the areas targeted by this assay, and inadequate number of viral copies(<138 copies/mL). A negative result must be combined with clinical observations, patient history, and epidemiological information. The expected result is Negative.  Fact Sheet for Patients:  BloggerCourse.com  Fact Sheet for Healthcare Providers:  SeriousBroker.it  This test is no t yet approved or cleared by the [place]  FDA and  has been authorized for detection and/or diagnosis of SARS-CoV-2 by FDA under an Emergency Use Authorization (EUA). This EUA will remain  in effect (meaning this test can be used) for the duration of the COVID-19 declaration under Section 564(b)(1) of the Act, 21 U.S.C.section 360bbb-3(b)(1), unless the authorization is terminated  or revoked sooner.       Influenza A by PCR NEGATIVE NEGATIVE Final   Influenza B by PCR NEGATIVE NEGATIVE Final    Comment: (NOTE) The Xpert Xpress SARS-CoV-2/FLU/RSV plus assay is intended as an aid in the diagnosis of influenza from Nasopharyngeal swab specimens and should not be used as a sole basis for treatment. Nasal washings and aspirates are unacceptable for Xpert Xpress SARS-CoV-2/FLU/RSV testing.  Fact Sheet for Patients: BloggerCourse.com  Fact Sheet for Healthcare Providers: SeriousBroker.it  This test is not yet approved or cleared by the [place]  FDA and has been authorized for detection and/or diagnosis of SARS-CoV-2 by FDA under an Emergency Use Authorization (EUA). This EUA will remain in  effect (meaning this test can be used) for the duration of the COVID-19 declaration under Section 564(b)(1) of the Act, 21 U.S.C. section 360bbb-3(b)(1), unless the authorization is terminated or revoked.     Resp Syncytial Virus by PCR NEGATIVE NEGATIVE Final    Comment: (NOTE) Fact Sheet for Patients: BloggerCourse.com  Fact Sheet for Healthcare Providers: SeriousBroker.it  This test is not yet approved or cleared by the [place]  FDA and has been authorized for detection and/or diagnosis of SARS-CoV-2 by FDA under an Emergency Use Authorization (EUA). This EUA will remain in effect (meaning this test can be used) for the duration of the COVID-19 declaration under Section 564(b)(1) of the Act, 21 U.S.C. section 360bbb-3(b)(1), unless the authorization is terminated or revoked.  Performed at [facility], [address] [place], [place] [address]    Time coordinating discharge: 25 minutes  SIGNED: Mennie LAMY, MD  [facility] 04/08/2024, 10:12 AM  If 7PM-7AM, please contact night-coverage [email or web address]

## 2024-04-08 NOTE — Hospital Course (Addendum)
 83 year old with history of ischemic bowel in 2015 treated conservatively, essential hypertension, hyperlipidemia, CKD stage IIIb with baseline creatinine about 1.3-1.7, chronic diastolic heart failure presented with acute abdominal pain, nausea. In the ED hemodynamically stable labs with-Lipase 35.  WC count 18.3 K.  COVID, influenza, RSV negative.Urinalysis normal. 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  Vascular surgery input appreciated will advance her diet and discharge the patient if tolerated well Off heparin  infusion, continue Plavix  and statin Plan for outpatient follow-up with vascular   Lactic acidosis leukocytosis: Suspect due to above.  Resolving.  Antibiotic discontinued.    Left lower lobe airspace abnormality on CT scan: Current no obvious infection suspected as Flu, RSV and COVID-negative.  Procalcitonin less than 0.1.  Off antibiotics likely chronic changes    GI 4000 Stable continue home amlodipine  HCTZ Toprol  and Aldactone    Hyperlipidemia: On statin.   Hypothyroidism: cont Synthroid .   CKD stage IIIb: Stable at about baseline.  DVT prophylaxis: off heparin  Code Status:   Code Status: Full Code Family  Communication: plan of care discussed with patient at bedside. Patient status is: Remains hospitalized because of severity of illness Level of care: Progressive   Dispo: The patient is from: home            Anticipated disposition: today Objective: Vitals last 24 hrs: Vitals:   04/07/24 2300 04/08/24 0300 04/08/24 0500 04/08/24 0724  BP: (!) 122/48 (!) 118/45  (!) 128/45  Pulse: 65 67  62  Resp: 14 18  15   Temp: 98.9 F (37.2 C) 98.1 F (36.7 C)  98.2 F (36.8 C)  TempSrc: Oral Oral  Oral  SpO2: 96% 97%  96%  Weight:   50.7 kg   Height:        Physical Examination: General exam: AAO X 3 HEENT:Oral mucosa moist, Ear/Nose WNL grossly Respiratory system: Bilaterally clear BS,no use of accessory muscle Cardiovascular system: S1 & S2 +, No JVD. Gastrointestinal system: Abdomen soft,NT,ND, BS+ Nervous System: Alert, awake, moving all extremities,and following commands. Extremities: LE edema neg, distal extremities warm.  Skin: No rashes,no icterus. MSK: Normal muscle bulk,tone, power   Medications reviewed:  Scheduled Meds:  amLODipine   10 mg Oral QHS   vitamin C   1,000 mg Oral Daily   clopidogrel   75 mg Oral Daily   irbesartan   300 mg Oral Daily   And   hydrochlorothiazide   25 mg Oral Daily   metoprolol  tartrate  50 mg Oral BID   pantoprazole   40 mg Oral Daily   rosuvastatin   20 mg Oral Daily   spironolactone   50 mg Oral Daily   Continuous Infusions:  lactated ringers  75 mL/hr at 04/07/24 2146   Diet: Diet Order             DIET SOFT Room service appropriate? Yes; Fluid  consistency: Thin  Diet effective now           Diet - low sodium heart healthy

## 2024-04-08 NOTE — Progress Notes (Addendum)
  Progress Note    04/08/2024 9:00 AM * No surgery found *  Subjective:  Abd feeling better   Vitals:   04/08/24 0300 04/08/24 0724  BP: (!) 118/45 (!) 128/45  Pulse: 67 62  Resp: 18 15  Temp: 98.1 F (36.7 C) 98.2 F (36.8 C)  SpO2: 97% 96%   Physical Exam Lungs:  non labored Extremities:  moving all ext well Abdomen:  soft, NT, ND Neurologic: A&O  CBC    Component Value Date/Time   WBC 12.5 (H) 04/08/2024 0347   RBC 2.93 (L) 04/08/2024 0347   HGB 9.5 (L) 04/08/2024 0347   HCT 26.9 (L) 04/08/2024 0347   PLT 206 04/08/2024 0347   MCV 91.8 04/08/2024 0347   MCH 32.4 04/08/2024 0347   MCHC 35.3 04/08/2024 0347   RDW 12.8 04/08/2024 0347   LYMPHSABS 1.6 04/08/2024 0347   MONOABS 1.0 04/08/2024 0347   EOSABS 0.1 04/08/2024 0347   BASOSABS 0.0 04/08/2024 0347    BMET    Component Value Date/Time   NA 132 (L) 04/08/2024 0347   K 3.6 04/08/2024 0347   CL 106 04/08/2024 0347   CO2 22 04/08/2024 0347   GLUCOSE 99 04/08/2024 0347   BUN 25 (H) 04/08/2024 0347   CREATININE 1.28 (H) 04/08/2024 0347   CALCIUM  8.3 (L) 04/08/2024 0347   GFRNONAA 41 (L) 04/08/2024 0347   GFRAA 77 (L) 10/03/2013 0502    INR    Component Value Date/Time   INR 1.1 04/06/2024 2052     Intake/Output Summary (Last 24 hours) at 04/08/2024 0900 Last data filed at 04/08/2024 0300 Gross per 24 hour  Intake 965.84 ml  Output 1100 ml  Net -134.16 ml     Assessment/Plan:  83 y.o. female with abd pain; mesenteric artery stenosis  Subjectively abd continues to improve.  No indication for angiography at this time.  Advance diet as tolerated.  Plan is to check a mesenteric duplex in about 1 month and reevaluate treatment options based on symptoms.  Continue plavix , statin.  Ok for discharge if tolerating a regular diet.   Donnice Sender, PA-C [facility] [phone] 04/08/2024 9:00 AM  I agree with the above.  I have seen and evaluated the patient.  Her son is at  the bedside for interpreting.  Plan to advance diet today.  If she tolerates this, she can be discharged with follow-up with duplex in 1 month.  Malvina New

## 2024-04-08 NOTE — Plan of Care (Signed)
  Problem: Education: Goal: Knowledge of General Education information will improve Description: Including pain rating scale, medication(s)/side effects and non-pharmacologic comfort measures Outcome: Progressing   Problem: Health Behavior/Discharge Planning: Goal: Ability to manage health-related needs will improve Outcome: Progressing   Problem: Clinical Measurements: Goal: Ability to maintain clinical measurements within normal limits will improve Outcome: Progressing Goal: Diagnostic test results will improve Outcome: Progressing Goal: Cardiovascular complication will be avoided Outcome: Progressing   Problem: Activity: Goal: Risk for activity intolerance will decrease Outcome: Progressing   Problem: Nutrition: Goal: Adequate nutrition will be maintained Outcome: Progressing   Problem: Elimination: Goal: Will not experience complications related to bowel motility Outcome: Progressing

## 2024-04-10 ENCOUNTER — Telehealth: Payer: Self-pay

## 2024-04-10 NOTE — Transitions of Care (Post Inpatient/ED Visit) (Signed)
   04/10/2024  Name: Jennifer Russo MRN: [id number] DOB: [birth year]  Today's TOC FU Call Status: Today's TOC FU Call Status:: Unsuccessful Call (1st Attempt) Unsuccessful Call (1st Attempt) Date: 04/10/24  Attempted to reach the patient regarding the most recent Inpatient/ED visit.  Follow Up Plan: Additional outreach attempts will be made to reach the patient to complete the Transitions of Care (Post Inpatient/ED visit) call.   Lamyra Malcolm J. Emree Locicero RN, MSN [facility], [facility] RN Care Manager Direct Dial: [phone]  Fax: [phone] Website: [email or web address]

## 2024-04-11 ENCOUNTER — Telehealth: Payer: Self-pay

## 2024-04-11 NOTE — Transitions of Care (Post Inpatient/ED Visit) (Signed)
   04/11/2024  Name: Jennifer Russo MRN: [id number] DOB: [birth year]  Today's TOC FU Call Status: Today's TOC FU Call Status:: Unsuccessful Call (2nd Attempt) Unsuccessful Call (2nd Attempt) Date: 04/11/24  Attempted to reach the patient regarding the most recent Inpatient/ED visit. Via [employer] # [phone]  Follow Up Plan: Additional outreach attempts will be made to reach the patient to complete the Transitions of Care (Post Inpatient/ED visit) call.   Cay Kath J. Dayra Rapley RN, MSN [facility], [facility] RN Care Manager Direct Dial: [phone]  Fax: [phone] Website: [email or web address]

## 2024-04-12 ENCOUNTER — Telehealth: Payer: Self-pay

## 2024-04-12 NOTE — Transitions of Care (Post Inpatient/ED Visit) (Signed)
   04/12/2024  Name: Jennifer Russo MRN: [id number] DOB: [birth year]  Today's TOC FU Call Status: Today's TOC FU Call Status:: Unsuccessful Call (3rd Attempt) Unsuccessful Call (3rd Attempt) Date: 04/12/24  Attempted to reach the patient regarding the most recent Inpatient/ED visit.  Follow Up Plan: No further outreach attempts will be made at this time. We have been unable to contact the patient.  Jeovany Huitron J. Bertina Guthridge RN, MSN [facility], [facility] RN Care Manager Direct Dial: [phone]  Fax: [phone] Website: [email or web address]

## 2024-04-21 ENCOUNTER — Other Ambulatory Visit: Payer: Self-pay

## 2024-04-21 DIAGNOSIS — K551 Chronic vascular disorders of intestine: Secondary | ICD-10-CM

## 2024-05-09 ENCOUNTER — Ambulatory Visit (HOSPITAL_COMMUNITY)
Admission: RE | Admit: 2024-05-09 | Discharge: 2024-05-09 | Disposition: A | Discharge status: Home / Self Care | Source: Ambulatory Visit | Attending: Internal Medicine | Admitting: Internal Medicine

## 2024-05-09 DIAGNOSIS — M810 Age-related osteoporosis without current pathological fracture: Secondary | ICD-10-CM | POA: Diagnosis not present

## 2024-05-09 DIAGNOSIS — I350 Nonrheumatic aortic (valve) stenosis: Secondary | ICD-10-CM | POA: Insufficient documentation

## 2024-05-09 LAB — ECHOCARDIOGRAM COMPLETE
AR max vel: 1.16 cm2
AV Area VTI: 1.14 cm2
AV Area mean vel: 1.17 cm2
AV Mean grad: 16 mmHg
AV Peak grad: 27.9 mmHg
Ao pk vel: 2.64 m/s
Area-P 1/2: 2.09 cm2
P 1/2 time: 482 ms
S' Lateral: 2.7 cm

## 2024-05-15 ENCOUNTER — Ambulatory Visit: Payer: Self-pay | Admitting: Cardiovascular Disease

## 2024-05-22 NOTE — Progress Notes (Unsigned)
 [facility] [place]  ASSESSMENT / PLAN: 83 y.o. 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  Celiac artery occlusion.  Superior mesenteric artery stenosis.  IMA occlusion.   Recommend:  Abstinence from all tobacco products. Blood glucose control with goal A1c < 7%. Blood pressure control with goal blood pressure < 130/80 mmHg. Lipid reduction therapy with goal LDL-C < 55 mg/dL. Aspirin  81mg  by mouth daily. Atorvastatin 40-80mg  PO QD (or other high intensity statin therapy).   She has atypical symptoms.  She is losing weight.  She does have some postprandial nausea and occasional vomiting.  She does not describe pain, per se.  I offered the patient superior mesenteric artery stenting, with the caveat that it may not resolve her symptoms.  Patient and her daughter prefer a more conservative approach for now.  I would have referred her to gastroenterology for their opinion on the issue.  I will see her again in 1 to 2 months to evaluate her weight and her symptoms.  CHIEF COMPLAINT: Follow-up mesenteric stenosis  HISTORY OF PRESENT ILLNESS: Jennifer Russo is a 83 y.o. female who presents to the [facility] emergency department for evaluation of abdominal pain. Abdominal pain began yesterday afternoon. She has had a similar episode of severe abdominal pain about 10 years ago which required observation. Exploratory laparotomy was considered but given her resolution of symptoms, was not pursued. There is a bit of a language barrier in taking a history today. Her pastor's wife assists with translation. She does not report any food fear, postprandial pain, or unintentional weight loss. She has lost some weight after neurosurgery last fall, but thinks he has gained much of it back slowly. I counseled her about her CT angiogram findings.   05/23/24: Returns to clinic for surveillance.  Her daughter is with her today who acts as a Nurse, learning disability.  Her daughter reports the  patient has been experiencing weight loss.  She will have occasional postprandial nausea.  She has a reduced appetite.  She does not report typical postprandial pain, or food fear.  Past Medical History:  Diagnosis Date   Arthritis    Asymptomatic carotid artery stenosis    CAD (coronary artery disease)    s/p PCI- LAD and RCA with drug-eluting stents, PTCA in 2009 to treat in stent restenosis in RCA   Essential hypertension    HLD (hyperlipidemia)    Hypothyroidism    Ischemic colitis    around 2015   Macular degeneration    Stroke [facility])    when she lived in [place] (2000's)    Past Surgical History:  Procedure Laterality Date   CATARACT EXTRACTION     CORONARY ANGIOPLASTY  03-30-05 / 04-14-05   CORONARY STENT PLACEMENT     FEMORAL ARTERY REPAIR Right 07/03/2008   Groin area   KNEE ARTHROSCOPY Left 11/09/2022   Procedure: Left knee arthroscopy; meniscal debridement;  Surgeon: Melodi Lerner, MD;  Location: [facility] ORS;  Service: Orthopedics;  Laterality: Left;   KNEE SURGERY  07/2016   THORACIC DISCECTOMY N/A 06/28/2023   Procedure: Laminectomy - Thoracic eleven-Thoracic twelve;  Surgeon: Louis Shove, MD;  Location: [facility];  Service: Neurosurgery;  Laterality: N/A;    Family History  Problem Relation Age of Onset   Hypertension Mother    Diabetes Father     Social History   Socioeconomic History   Marital status: Married    Spouse name: Not on file   Number of children: 3  Years of education: Not on file   Highest education level: Not on file  Occupational History   Not on file  Tobacco Use   Smoking status: Never   Smokeless tobacco: Never  Vaping Use   Vaping status: Never Used  Substance and Sexual Activity   Alcohol  use: No    Alcohol /week: 0.0 standard drinks of alcohol    Drug use: No   Sexual activity: Not on file  Other Topics Concern   Not on file  Social History Narrative   Not on file   Social Drivers of Health   Financial Resource Strain: Not on  file  Food Insecurity: No Food Insecurity (04/07/2024)   Hunger Vital Sign    Worried About Running Out of Food in the Last Year: Never true    Ran Out of Food in the Last Year: Never true  Transportation Needs: No Transportation Needs (04/07/2024)   PRAPARE - Transportation    Lack of Transportation (Medical): No    Lack of Transportation (Non-Medical): No  Physical Activity: Not on file  Stress: Not on file  Social Connections: Socially Integrated (04/07/2024)   Social Connection and Isolation Panel    Frequency of Communication with Friends and Family: More than three times a week    Frequency of Social Gatherings with Friends and Family: Once a week    Attends Religious Services: More than 4 times per year    Active Member of [employer] or Organizations: Yes    Attends Banker Meetings: 1 to 4 times per year    Marital Status: Married  Catering manager Violence: Not At Risk (04/07/2024)   Humiliation, Afraid, Rape, and Kick questionnaire    Fear of Current or Ex-Partner: No    Emotionally Abused: No    Physically Abused: No    Sexually Abused: No    Allergies  Allergen Reactions   Atorvastatin     Elevated LFTs per [facility]   Penicillins Other (See Comments)    Shaking episode per [facility]   Sulfonamide Derivatives Nausea And Vomiting    Per [facility]   Tramadol     Upset stomach Itching per [facility]    Current Outpatient Medications  Medication Sig Dispense Refill   amLODipine  (NORVASC ) 10 MG tablet Take 10 mg by mouth at bedtime.     Ascorbic Acid  (VITAMIN C ) 1000 MG tablet Take 1,000 mg by mouth daily.     Calcium  Carb-Cholecalciferol  (CALCIUM  600 + D PO) Take 1 tablet by mouth in the morning and at bedtime.     Cholecalciferol  (VITAMIN D ) 2000 UNITS CAPS Take 2,000 Units by mouth daily.     clopidogrel  (PLAVIX ) 75 MG tablet Take 75 mg by mouth daily.     levothyroxine  (SYNTHROID , LEVOTHROID) 50 MCG tablet Take 50 mcg by mouth daily before breakfast.     metoprolol   (LOPRESSOR ) 50 MG tablet Take 50 mg by mouth 2 (two) times daily.     Multiple Vitamins-Minerals (MULTIVITAL) tablet Take 1 tablet by mouth daily.     Omega-3 Fatty Acids (FISH OIL) 1000 MG CAPS Take 1,000 mg by mouth daily.     Polyethyl Glycol-Propyl Glycol (SYSTANE OP) Place 1 drop into both eyes 2 (two) times daily as needed (drye eyes).     rosuvastatin  (CRESTOR ) 20 MG tablet Take 20 mg by mouth daily.     spironolactone  (ALDACTONE ) 50 MG tablet Take 50 mg by mouth daily.     telmisartan -hydrochlorothiazide  (MICARDIS  HCT) 80-25 MG per tablet Take 1  tablet by mouth daily.     No current facility-administered medications for this visit.    PHYSICAL EXAM Vitals:   05/23/24 0844  BP: 120/61  Pulse: 63  Temp: 97.9 F (36.6 C)  SpO2: 98%  Weight: 103 lb (46.7 kg)  Height: 5' 3 (1.6 m)   No acute distress Regular rate and rhythm Unlabored breathing Abdomen soft and nontender  PERTINENT LABORATORY AND RADIOLOGIC DATA  Most recent CBC    Latest Ref Rng & Units 04/08/2024    3:47 AM 04/07/2024    3:51 AM 04/06/2024    8:18 PM  CBC  WBC 4.0 - 10.5 K/uL 12.5  16.2  18.3   Hemoglobin 12.0 - 15.0 g/dL 9.5  89.6  88.4   Hematocrit 36.0 - 46.0 % 26.9  29.5  34.9   Platelets 150 - 400 K/uL 206  241  279      Most recent CMP    Latest Ref Rng & Units 04/08/2024    3:47 AM 04/07/2024    3:51 AM 04/06/2024    8:18 PM  CMP  Glucose 70 - 99 mg/dL 99  872  809   BUN 8 - 23 mg/dL 25  23  33   Creatinine 0.44 - 1.00 mg/dL 8.71  8.88  8.68   Sodium 135 - 145 mmol/L 132  141  132   Potassium 3.5 - 5.1 mmol/L 3.6  4.5  4.0   Chloride 98 - 111 mmol/L 106  106  98   CO2 22 - 32 mmol/L 22  20  19    Calcium  8.9 - 10.3 mg/dL 8.3  9.3  9.8   Total Protein 6.5 - 8.1 g/dL  6.3  8.1   Total Bilirubin 0.0 - 1.2 mg/dL  0.6  0.6   Alkaline Phos 38 - 126 U/L  42  53   AST 15 - 41 U/L  24  28   ALT 0 - 44 U/L  15  15    ABDOMINAL VISCERAL  Patient Name:  Jennifer Russo  Date of Exam:    05/23/2024 Medical Rec #: [id number]      Accession #:    [id number] Date of Birth: 07/25/1941      Patient Gender: F Patient Age:   52 years Exam Location:  [address] Procedure:      VAS US  MESENTERIC Referring Phys: [id number] DEBBY SAILOR Beatrice Sehgal   --------------------------------------------------------------------------- -----  Indications: Stenosis of inferior mesenteric artery  Performing Technologist: Duwaine Hives RVS    Examination Guidelines: A complete evaluation includes B-mode imaging, spectral Doppler, color Doppler, and power Doppler as needed of all accessible portions of each vessel. Bilateral testing is considered an integral part of a complete examination. Limited examinations for reoccurring indications may be performed as noted.    Duplex Findings: +--------------------+--------+--------+------+------------------+ Mesenteric          PSV cm/sEDV cm/sPlaque     Comments      +--------------------+--------+--------+------+------------------+ Aorta Prox            172                                    +--------------------+--------+--------+------+------------------+ Aorta Mid              89                                    +--------------------+--------+--------+------+------------------+  Aorta Distal           82                                    +--------------------+--------+--------+------+------------------+ Celiac Artery Origin   50                 fed via collateral +--------------------+--------+--------+------+------------------+ SMA Origin            545                                    +--------------------+--------+--------+------+------------------+ SMA Proximal          457                                    +--------------------+--------+--------+------+------------------+ SMA Mid               191      34                             +--------------------+--------+--------+------+------------------+ SMA Distal            170      49                            +--------------------+--------+--------+------+------------------+ CHA                    74                                    +--------------------+--------+--------+------+------------------+ Splenic                54                                    +--------------------+--------+--------+------+------------------+ IMA                                         not visualized   +--------------------+--------+--------+------+------------------+            Summary: Mesenteric: 70 to 99% stenosis in the superior mesenteric artery. Celiac artery appears to be filled via SMA collaterals.   *See table(s) above for measurements and observations.      Debby SAILOR. Magda, MD FACS [facility] Phone Number: [phone] 05/23/2024 10:06 AM   Total time spent on preparing this encounter including chart review, data review, collecting history, examining the patient, and coordinating care: 40 min  Portions of this report may have been transcribed using voice recognition software.  Every effort has been made to ensure accuracy; however, inadvertent computerized transcription errors may still be present.

## 2024-05-23 ENCOUNTER — Ambulatory Visit: Attending: Vascular Surgery | Admitting: Vascular Surgery

## 2024-05-23 ENCOUNTER — Encounter: Payer: Self-pay | Admitting: Vascular Surgery

## 2024-05-23 ENCOUNTER — Ambulatory Visit (HOSPITAL_COMMUNITY)
Admission: RE | Admit: 2024-05-23 | Discharge: 2024-05-23 | Disposition: A | Discharge status: Home / Self Care | Source: Ambulatory Visit | Attending: Vascular Surgery | Admitting: Vascular Surgery

## 2024-05-23 VITALS — BP 120/61 | HR 63 | Temp 97.9°F | Ht 63.0 in | Wt 103.0 lb

## 2024-05-23 DIAGNOSIS — K551 Chronic vascular disorders of intestine: Secondary | ICD-10-CM

## 2024-06-02 ENCOUNTER — Encounter: Payer: Self-pay | Admitting: Vascular Surgery

## 2024-06-02 DIAGNOSIS — K551 Chronic vascular disorders of intestine: Secondary | ICD-10-CM | POA: Diagnosis not present

## 2024-06-02 DIAGNOSIS — R112 Nausea with vomiting, unspecified: Secondary | ICD-10-CM | POA: Diagnosis not present

## 2024-06-02 DIAGNOSIS — R634 Abnormal weight loss: Secondary | ICD-10-CM | POA: Diagnosis not present

## 2024-06-03 ENCOUNTER — Encounter: Payer: Self-pay | Admitting: Vascular Surgery

## 2024-06-14 ENCOUNTER — Ambulatory Visit: Attending: Cardiovascular Disease | Admitting: Cardiovascular Disease

## 2024-06-14 ENCOUNTER — Encounter: Payer: Self-pay | Admitting: Cardiovascular Disease

## 2024-06-14 VITALS — BP 132/57 | HR 60 | Ht 62.0 in | Wt 106.5 lb

## 2024-06-14 DIAGNOSIS — I2510 Atherosclerotic heart disease of native coronary artery without angina pectoris: Secondary | ICD-10-CM

## 2024-06-14 DIAGNOSIS — I10 Essential (primary) hypertension: Secondary | ICD-10-CM | POA: Diagnosis not present

## 2024-06-14 DIAGNOSIS — I350 Nonrheumatic aortic (valve) stenosis: Secondary | ICD-10-CM

## 2024-06-14 DIAGNOSIS — I6523 Occlusion and stenosis of bilateral carotid arteries: Secondary | ICD-10-CM

## 2024-06-14 DIAGNOSIS — E782 Mixed hyperlipidemia: Secondary | ICD-10-CM | POA: Diagnosis not present

## 2024-06-14 NOTE — Progress Notes (Signed)
 Cardiology Office Note:    Date:  06/14/2024   ID:  Dorri, Ozturk 08-27-1940, MRN [id number]  PCP:  Clarice Nottingham, MD   [facility] Providers Cardiologist:  Ozell Fell, MD     Referring MD: Clarice Nottingham, MD   Chief Complaint  Patient presents with   Coronary Artery Disease    History of Present Illness:    Jennifer Russo is a 83 y.o. female with a hx of coronary artery disease, presenting for follow-up evaluation.  She has a history of remote LAD and RCA stenting, mild aortic stenosis, hypertension, carotid stenosis, and mixed hyperlipidemia.  The patient is here alone today.  In the past year she has had knee surgery and lumbar surgery.  She has lost a good deal of weight.  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  A conservative approach has been favored by the patient and her family.  Her weight is down about 30 pounds over the last 18 months.  From a cardiac perspective, she denies chest pain, chest pressure, or shortness of breath.  No leg swelling.  She is now ambulating with a walker.   Current Medications: Current Meds  Medication Sig   amLODipine  (NORVASC ) 10 MG tablet Take 10 mg by mouth at bedtime.   Ascorbic Acid  (VITAMIN C ) 1000 MG tablet Take 1,000 mg by mouth daily.   Calcium  Carb-Cholecalciferol  (CALCIUM  600 + D PO) Take 1 tablet by mouth in the morning and at bedtime.   Cholecalciferol  (VITAMIN D ) 2000 UNITS CAPS Take 2,000 Units by mouth daily.   clopidogrel  (PLAVIX ) 75 MG tablet Take 75 mg by mouth daily.   levothyroxine  (SYNTHROID , LEVOTHROID) 50 MCG tablet Take 50 mcg by mouth daily before breakfast.   metoprolol  (LOPRESSOR ) 50 MG tablet Take 50 mg by mouth 2 (two) times daily.   Multiple Vitamins-Minerals (MULTIVITAL) tablet Take 1 tablet by mouth daily.   Omega-3 Fatty Acids (FISH OIL) 1000 MG CAPS Take 1,000 mg by mouth daily.    Polyethyl Glycol-Propyl Glycol (SYSTANE OP) Place 1 drop into both eyes 2 (two) times daily as needed (drye eyes).   rosuvastatin  (CRESTOR ) 20 MG tablet Take 20 mg by mouth daily.   spironolactone  (ALDACTONE ) 50 MG tablet Take 50 mg by mouth daily.   telmisartan -hydrochlorothiazide  (MICARDIS  HCT) 80-25 MG per tablet Take 1 tablet by mouth daily.     Allergies:   Atorvastatin, Penicillins, Sulfonamide derivatives, and Tramadol   ROS:   Please see the history of present illness.    All other systems reviewed and are negative.  EKGs/Labs/Other Studies Reviewed:    The following studies were reviewed today: Cardiac Studies & Procedures   ______________________________________________________________________________________________     ECHOCARDIOGRAM  ECHOCARDIOGRAM COMPLETE 05/09/2024  Narrative ECHOCARDIOGRAM REPORT    Patient Name:   Jennifer Russo Date of Exam: 05/09/2024 Medical Rec #:  [id number]     Height:       63.0 in Accession #:    [id number]    Weight:       111.8 lb Date of Birth:  [birth year]     BSA:          1.510 m Patient Age:    83 years      BP:           131/59 mmHg Patient Gender: F  HR:           49 bpm. Exam Location:  [address]  Procedure: 2D Echo, 3D Echo, Cardiac Doppler, Color Doppler and Strain Analysis (Both Spectral and Color Flow Doppler were utilized during procedure).  Indications:    I35.0 Aortic Stenosis  History:        Patient has prior history of Echocardiogram examinations, most recent 09/18/2022. CHF, CKD; Risk Factors:Hypertension and HLD.  Sonographer:    Waldo Guadalajara RCS Referring Phys: [phone] Giani Winther  IMPRESSIONS   1. Left ventricular ejection fraction, by estimation, is 60 to 65%. Left ventricular ejection fraction by 3D volume is 61 %. The left ventricle has normal function. The left ventricle has no regional wall motion abnormalities. There is mild left ventricular hypertrophy. Left ventricular diastolic  parameters were normal. The average left ventricular global longitudinal strain is -21.0 %. The global longitudinal strain is normal. 2. Right ventricular systolic function is normal. The right ventricular size is normal. There is normal pulmonary artery systolic pressure. The estimated right ventricular systolic pressure is 22.4 mmHg. 3. The mitral valve is degenerative. Trivial mitral valve regurgitation. Moderate mitral annular calcification. 4. The inferior vena cava is normal in size with greater than 50% respiratory variability, suggesting right atrial pressure of 3 mmHg. 5. The aortic valve is calcified. Aortic valve regurgitation is moderate to severe. Moderate aortic valve stenosis. VMax 2.6 m/s, MG , AVA 1.1 cm^2, DI 0.41  FINDINGS Left Ventricle: Left ventricular ejection fraction, by estimation, is 60 to 65%. Left ventricular ejection fraction by 3D volume is 61 %. The left ventricle has normal function. The left ventricle has no regional wall motion abnormalities. The average left ventricular global longitudinal strain is -21.0 %. Strain was performed and the global longitudinal strain is normal. The left ventricular internal cavity size was normal in size. There is mild left ventricular hypertrophy. Left ventricular diastolic parameters were normal.  Right Ventricle: The right ventricular size is normal. No increase in right ventricular wall thickness. Right ventricular systolic function is normal. There is normal pulmonary artery systolic pressure. The tricuspid regurgitant velocity is 2.20 m/s, and with an assumed right atrial pressure of 3 mmHg, the estimated right ventricular systolic pressure is 22.4 mmHg.  Left Atrium: Left atrial size was normal in size.  Right Atrium: Right atrial size was normal in size.  Pericardium: There is no evidence of pericardial effusion.  Mitral Valve: The mitral valve is degenerative in appearance. Moderate mitral annular calcification.  Trivial mitral valve regurgitation.  Tricuspid Valve: The tricuspid valve is normal in structure. Tricuspid valve regurgitation is trivial.  Aortic Valve: The aortic valve is calcified. Aortic valve regurgitation is moderate to severe. Aortic regurgitation PHT measures 482 msec. Moderate aortic stenosis is present. Aortic valve mean gradient measures 16.0 mmHg. Aortic valve peak gradient measures 27.9 mmHg. Aortic valve area, by VTI measures 1.14 cm.  Pulmonic Valve: The pulmonic valve was not well visualized. Pulmonic valve regurgitation is mild.  Aorta: The aortic root is normal in size and structure.  Venous: The inferior vena cava is normal in size with greater than 50% respiratory variability, suggesting right atrial pressure of 3 mmHg.  IAS/Shunts: The interatrial septum was not well visualized.  Additional Comments: 3D was performed not requiring image post processing on an independent workstation and was normal.   LEFT VENTRICLE PLAX 2D LVIDd:         4.30 cm         Diastology LVIDs:  2.70 cm         LV e' medial:    7.29 cm/s LV PW:         0.90 cm         LV E/e' medial:  9.9 LV IVS:        1.10 cm         LV e' lateral:   7.51 cm/s LVOT diam:     1.90 cm         LV E/e' lateral: 9.6 LV SV:         84 LV SV Index:   56              2D Longitudinal LVOT Area:     2.84 cm        Strain LV IVRT:       144 msec        2D Strain GLS   -20.7 % (A4C): 2D Strain GLS   -19.1 % (A3C): 2D Strain GLS   -23.2 % (A2C): 2D Strain GLS   -21.0 % Avg:  3D Volume EF LV 3D EF:    Left ventricul ar ejection fraction by 3D volume is 61 %.  3D Volume EF: 3D EF:        61 % LV EDV:       122 ml LV ESV:       47 ml LV SV:        75 ml  RIGHT VENTRICLE RV Basal diam:  2.90 cm     PULMONARY VEINS RV S prime:     14.10 cm/s  A Reversal Velocity: 24.60 cm/s TAPSE (M-mode): 2.1 cm      Diastolic Velocity:  49.80 cm/s RVSP:           22.4 mmHg   S/D Velocity:         1.20 Systolic Velocity:   58.80 cm/s  LEFT ATRIUM             Index        RIGHT ATRIUM           Index LA diam:        3.60 cm 2.38 cm/m   RA Pressure: 3.00 mmHg LA Vol (A2C):   41.7 ml 27.62 ml/m  RA Area:     8.76 cm LA Vol (A4C):   35.7 ml 23.64 ml/m  RA Volume:   15.10 ml  10.00 ml/m LA Biplane Vol: 38.8 ml 25.70 ml/m AORTIC VALVE                     PULMONIC VALVE AV Area (Vmax):                  PV Vmax:       2.20 m/s AV Area (Vmean):                 PV Peak grad:  19.4 mmHg AV Area (VTI):     1.14 cm AV Vmax:           264.00 cm/s AV Vmean:          187.000 cm/s AV VTI:            0.735 m AV Peak Grad:      27.9 mmHg AV Mean Grad:      16.0 mmHg LVOT Vmax:         108.00 cm/s LVOT Vmean:        77.300  cm/s LVOT VTI:          0.296 m LVOT/AV VTI ratio: 0.40 AI PHT:            482 msec  AORTA Ao Root diam: 3.10 cm Ao Asc diam:  3.60 cm  MITRAL VALVE               TRICUSPID VALVE MV Area (PHT): 2.09 cm    TR Peak grad:   19.4 mmHg MV Decel Time: 363 msec    TR Vmax:        220.00 cm/s MV E velocity: 72.10 cm/s  Estimated RAP:  3.00 mmHg MV A velocity: 93.20 cm/s  RVSP:           22.4 mmHg MV E/A ratio:  0.77 SHUNTS Systemic VTI:  0.30 m Systemic Diam: 1.90 cm  Lonni Nanas MD Electronically signed by Lonni Nanas MD Signature Date/Time: 05/09/2024/3:21:32 PM    Final          ______________________________________________________________________________________________      EKG:        Recent Labs: 04/07/2024: ALT 15; B Natriuretic Peptide 762.7; Magnesium 1.7; TSH 0.468 04/08/2024: BUN 25; Creatinine, Ser 1.28; Hemoglobin 9.5; Platelets 206; Potassium 3.6; Sodium 132  Recent Lipid Panel No results found for: CHOL, TRIG, HDL, CHOLHDL, VLDL, LDLCALC, LDLDIRECT   Risk Assessment/Calculations:                Physical Exam:    VS:  BP (!) 132/57   Pulse 60   Ht 5' 2 (1.575 m)   Wt 106 lb 8 oz (48.3 kg)    SpO2 99%   BMI 19.48 kg/m     Wt Readings from Last 3 Encounters:  06/14/24 106 lb 8 oz (48.3 kg)  05/23/24 103 lb (46.7 kg)  04/08/24 111 lb 12.4 oz (50.7 kg)     GEN:  Well nourished, well developed in no acute distress HEENT: Normal NECK: No JVD; No carotid bruits LYMPHATICS: No lymphadenopathy CARDIAC: RRR, no murmurs, rubs, gallops RESPIRATORY:  Clear to auscultation without rales, wheezing or rhonchi  ABDOMEN: Soft, non-tender, non-distended MUSCULOSKELETAL:  No edema; No deformity  SKIN: Warm and dry NEUROLOGIC:  Alert and oriented x 3 PSYCHIATRIC:  Normal affect   Assessment & Plan Nonrheumatic aortic valve stenosis Recent echo shows LVEF 60 to 65%, normal RV function, trivial MR, moderate to severe aortic regurgitation and moderate aortic stenosis with mean gradient 16 mmHg and calculated aortic valve area 1.1 cm.  I cannot appreciate a diastolic murmur on her exam today.  Considering her asymptomatic status, advanced age, and lack of LV dilatation or dysfunction, ongoing clinical surveillance is indicated.  I recommended a repeat echocardiogram in 1 year when she returns for follow-up. Coronary artery disease involving native coronary artery of native heart without angina pectoris No anginal symptoms on clopidogrel , rosuvastatin , and amlodipine .  Continue current management. Essential hypertension Blood pressure is well-controlled on spironolactone , telmisartan , hydrochlorothiazide , metoprolol  tartrate, and amlodipine . Mixed hyperlipidemia Treated with rosuvastatin  20 mg daily.  LDL cholesterol is 55. Bilateral extracranial carotid artery stenosis As above, patient on appropriate medical therapy with clopidogrel  and high intensity statin drug. Her most recent carotid scan from 2024 shows 40 to 59% right ICA stenosis and less than 40% left ICA stenosis.     Medication Adjustments/Labs and Tests Ordered: Current medicines are reviewed at length with the patient today.   Concerns regarding medicines are outlined above.  Orders Placed This Encounter  Procedures  ECHOCARDIOGRAM COMPLETE   No orders of the defined types were placed in this encounter.   Patient Instructions  Medication Instructions:  Your physician recommends that you continue on your current medications as directed. Please refer to the Current Medication list given to you today.  *If you need a refill on your cardiac medications before your next appointment, please call your pharmacy*  Testing/Procedures: Your physician has requested that you have an echocardiogram in September 2026. Echocardiography is a painless test that uses sound waves to create images of your heart. It provides your doctor with information about the size and shape of your heart and how well your heart's chambers and valves are working. This procedure takes approximately one hour. There are no restrictions for this procedure. Please do NOT wear cologne, perfume, aftershave, or lotions (deodorant is allowed). Please arrive 15 minutes prior to your appointment time.  Please note: We ask at that you not bring children with you during ultrasound (echo/ vascular) testing. Due to room size and safety concerns, children are not allowed in the ultrasound rooms during exams. Our front office staff cannot provide observation of children in our lobby area while testing is being conducted. An adult accompanying a patient to their appointment will only be allowed in the ultrasound room at the discretion of the ultrasound technician under special circumstances. We apologize for any inconvenience.   Follow-Up: At [facility], you and your health needs are our priority.  As part of our continuing mission to provide you with exceptional heart care, our providers are all part of one team.  This team includes your primary Cardiologist (physician) and Advanced Practice Providers or APPs (Physician Assistants and Nurse Practitioners)  who all work together to provide you with the care you need, when you need it.  Your next appointment:   1 year  Provider:   Ozell Fell, MD   We recommend signing up for the patient portal called MyChart.  Sign up information is provided on this After Visit Summary.  MyChart is used to connect with patients for Virtual Visits (Telemedicine).  Patients are able to view lab/test results, encounter notes, upcoming appointments, etc.  Non-urgent messages can be sent to your provider as well.    To learn more about what you can do with MyChart, go to [email or web address].    Signed, Ozell Fell, MD  06/14/2024 4:14 PM    [facility]

## 2024-06-14 NOTE — Assessment & Plan Note (Signed)
 Treated with rosuvastatin  20 mg daily.  LDL cholesterol is 55.

## 2024-06-14 NOTE — Patient Instructions (Addendum)
 Medication Instructions:  Your physician recommends that you continue on your current medications as directed. Please refer to the Current Medication list given to you today.  *If you need a refill on your cardiac medications before your next appointment, please call your pharmacy*  Testing/Procedures: Your physician has requested that you have an echocardiogram in September 2026. Echocardiography is a painless test that uses sound waves to create images of your heart. It provides your doctor with information about the size and shape of your heart and how well your heart's chambers and valves are working. This procedure takes approximately one hour. There are no restrictions for this procedure. Please do NOT wear cologne, perfume, aftershave, or lotions (deodorant is allowed). Please arrive 15 minutes prior to your appointment time.  Please note: We ask at that you not bring children with you during ultrasound (echo/ vascular) testing. Due to room size and safety concerns, children are not allowed in the ultrasound rooms during exams. Our front office staff cannot provide observation of children in our lobby area while testing is being conducted. An adult accompanying a patient to their appointment will only be allowed in the ultrasound room at the discretion of the ultrasound technician under special circumstances. We apologize for any inconvenience.   Follow-Up: At [facility], you and your health needs are our priority.  As part of our continuing mission to provide you with exceptional heart care, our providers are all part of one team.  This team includes your primary Cardiologist (physician) and Advanced Practice Providers or APPs (Physician Assistants and Nurse Practitioners) who all work together to provide you with the care you need, when you need it.  Your next appointment:   1 year  Provider:   Ozell Fell, MD   We recommend signing up for the patient portal called  MyChart.  Sign up information is provided on this After Visit Summary.  MyChart is used to connect with patients for Virtual Visits (Telemedicine).  Patients are able to view lab/test results, encounter notes, upcoming appointments, etc.  Non-urgent messages can be sent to your provider as well.    To learn more about what you can do with MyChart, go to [email or web address].

## 2024-06-28 ENCOUNTER — Encounter: Payer: Self-pay | Admitting: Vascular Surgery

## 2024-06-28 ENCOUNTER — Ambulatory Visit: Attending: Vascular Surgery | Admitting: Vascular Surgery

## 2024-06-28 VITALS — BP 124/55 | HR 52 | Temp 97.6°F | Resp 18 | Ht 62.0 in | Wt 111.6 lb

## 2024-06-28 DIAGNOSIS — K551 Chronic vascular disorders of intestine: Secondary | ICD-10-CM | POA: Diagnosis not present

## 2024-06-28 NOTE — H&P (View-Only) (Signed)
 [facility] [place]  ASSESSMENT / PLAN: 83 y.o. 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  Celiac artery occlusion.  Superior mesenteric artery stenosis.  IMA occlusion.   Recommend:  Abstinence from all tobacco products. Blood glucose control with goal A1c < 7%. Blood pressure control with goal blood pressure < 130/80 mmHg. Lipid reduction therapy with goal LDL-C < 55 mg/dL. Aspirin  81mg  by mouth daily. Atorvastatin 40-80mg  PO QD (or other high intensity statin therapy).   After evaluation by gastroenterology, the patient and their family are considering mesenteric artery intervention.  I agree with this approach and felt it reasonable to proceed.  The patient's daughter reports that they will likely move the patient and her husband to [place] to be closer to family.  She is not sure whether she would like to pursue intervention locally, or after they have settled in [place].  I counseled review there would probably be appropriate.  She would like some time to talk it over as a family and will call us  if they decide to proceed with superior mesenteric artery intervention here locally.  CHIEF COMPLAINT: Follow-up mesenteric stenosis  HISTORY OF PRESENT ILLNESS: Jennifer Russo is a 83 y.o. female who presents to the [facility] emergency department for evaluation of abdominal pain. Abdominal pain began yesterday afternoon. She has had a similar episode of severe abdominal pain about 10 years ago which required observation. Exploratory laparotomy was considered but given her resolution of symptoms, was not pursued. There is a bit of a language barrier in taking a history today. Her pastor's wife assists with translation. She does not report any food fear, postprandial pain, or unintentional weight loss. She has lost some weight after neurosurgery last fall, but thinks he has gained much of it back slowly. I counseled her about her CT angiogram findings.    05/23/24: Returns to clinic for surveillance.  Her daughter is with her today who acts as a nurse, learning disability.  Her daughter reports the patient has been experiencing weight loss.  She will have occasional postprandial nausea.  She has a reduced appetite.  She does not report typical postprandial pain, or food fear.  06/28/2024.  Returns after gastroenterology evaluation.  Gastroenterology physician assistant seemed to agree that chronic mesenteric ischemia was likely the cause of her symptoms.  She encouraged stenting.  We reviewed this in detail.  The patient's daughter reports to me that they are planning to move the patient and her husband to [place] to be closer to family.  She asked whether procedure should be performed here or in [place].  I counseled that either was appropriate as her symptoms are fairly stable.  She has gained some weight back.  She still has some occasional abdominal discomfort.  Past Medical History:  Diagnosis Date   Arthritis    Asymptomatic carotid artery stenosis    CAD (coronary artery disease)    s/p PCI- LAD and RCA with drug-eluting stents, PTCA in 2009 to treat in stent restenosis in RCA   Essential hypertension    HLD (hyperlipidemia)    Hypothyroidism    Ischemic colitis    around 2015   Macular degeneration    Stroke [facility])    when she lived in [place] (2000's)    Past Surgical History:  Procedure Laterality Date   CATARACT EXTRACTION     CORONARY ANGIOPLASTY  03-30-05 / 04-14-05   CORONARY STENT PLACEMENT     FEMORAL ARTERY REPAIR Right 07/03/2008   Groin  area   KNEE ARTHROSCOPY Left 11/09/2022   Procedure: Left knee arthroscopy; meniscal debridement;  Surgeon: Melodi Lerner, MD;  Location: [facility] ORS;  Service: Orthopedics;  Laterality: Left;   KNEE SURGERY  07/2016   THORACIC DISCECTOMY N/A 06/28/2023   Procedure: Laminectomy - Thoracic eleven-Thoracic twelve;  Surgeon: Louis Shove, MD;  Location: [facility] OR;  Service: Neurosurgery;  Laterality: N/A;     Family History  Problem Relation Age of Onset   Hypertension Mother    Diabetes Father     Social History   Socioeconomic History   Marital status: Married    Spouse name: Not on file   Number of children: 3   Years of education: Not on file   Highest education level: Not on file  Occupational History   Not on file  Tobacco Use   Smoking status: Never   Smokeless tobacco: Never  Vaping Use   Vaping status: Never Used  Substance and Sexual Activity   Alcohol  use: No    Alcohol /week: 0.0 standard drinks of alcohol    Drug use: No   Sexual activity: Not on file  Other Topics Concern   Not on file  Social History Narrative   Not on file   Social Drivers of Health   Financial Resource Strain: Not on file  Food Insecurity: No Food Insecurity (04/07/2024)   Hunger Vital Sign    Worried About Running Out of Food in the Last Year: Never true    Ran Out of Food in the Last Year: Never true  Transportation Needs: No Transportation Needs (04/07/2024)   PRAPARE - Transportation    Lack of Transportation (Medical): No    Lack of Transportation (Non-Medical): No  Physical Activity: Not on file  Stress: Not on file  Social Connections: Socially Integrated (04/07/2024)   Social Connection and Isolation Panel    Frequency of Communication with Friends and Family: More than three times a week    Frequency of Social Gatherings with Friends and Family: Once a week    Attends Religious Services: More than 4 times per year    Active Member of [employer] or Organizations: Yes    Attends Banker Meetings: 1 to 4 times per year    Marital Status: Married  Catering Manager Violence: Not At Risk (04/07/2024)   Humiliation, Afraid, Rape, and Kick questionnaire    Fear of Current or Ex-Partner: No    Emotionally Abused: No    Physically Abused: No    Sexually Abused: No    Allergies  Allergen Reactions   Atorvastatin     Elevated LFTs per [facility]   Penicillins Other (See  Comments)    Shaking episode per [facility]   Sulfonamide Derivatives Nausea And Vomiting    Per [facility]   Tramadol     Upset stomach Itching per [facility]    Current Outpatient Medications  Medication Sig Dispense Refill   amLODipine  (NORVASC ) 10 MG tablet Take 10 mg by mouth at bedtime.     Ascorbic Acid  (VITAMIN C ) 1000 MG tablet Take 1,000 mg by mouth daily.     Calcium  Carb-Cholecalciferol  (CALCIUM  600 + D PO) Take 1 tablet by mouth in the morning and at bedtime.     Cholecalciferol  (VITAMIN D ) 2000 UNITS CAPS Take 2,000 Units by mouth daily.     clopidogrel  (PLAVIX ) 75 MG tablet Take 75 mg by mouth daily.     levothyroxine  (SYNTHROID , LEVOTHROID) 50 MCG tablet Take 50 mcg by mouth daily before breakfast.  metoprolol  (LOPRESSOR ) 50 MG tablet Take 50 mg by mouth 2 (two) times daily.     Multiple Vitamins-Minerals (MULTIVITAL) tablet Take 1 tablet by mouth daily.     Omega-3 Fatty Acids (FISH OIL) 1000 MG CAPS Take 1,000 mg by mouth daily.     Polyethyl Glycol-Propyl Glycol (SYSTANE OP) Place 1 drop into both eyes 2 (two) times daily as needed (drye eyes).     rosuvastatin  (CRESTOR ) 20 MG tablet Take 20 mg by mouth daily.     spironolactone  (ALDACTONE ) 50 MG tablet Take 50 mg by mouth daily.     telmisartan -hydrochlorothiazide  (MICARDIS  HCT) 80-25 MG per tablet Take 1 tablet by mouth daily.     No current facility-administered medications for this visit.    PHYSICAL EXAM Vitals:   06/28/24 1530  BP: (!) 124/55  Pulse: (!) 52  Resp: 18  Temp: 97.6 F (36.4 C)  TempSrc: Temporal  SpO2: 99%  Weight: 111 lb 9.6 oz (50.6 kg)  Height: 5' 2 (1.575 m)   No acute distress Regular rate and rhythm Unlabored breathing Abdomen soft and nontender  PERTINENT LABORATORY AND RADIOLOGIC DATA  Most recent CBC    Latest Ref Rng & Units 04/08/2024    3:47 AM 04/07/2024    3:51 AM 04/06/2024    8:18 PM  CBC  WBC 4.0 - 10.5 K/uL 12.5  16.2  18.3   Hemoglobin 12.0 - 15.0 g/dL 9.5  89.6  88.4    Hematocrit 36.0 - 46.0 % 26.9  29.5  34.9   Platelets 150 - 400 K/uL 206  241  279      Most recent CMP    Latest Ref Rng & Units 04/08/2024    3:47 AM 04/07/2024    3:51 AM 04/06/2024    8:18 PM  CMP  Glucose 70 - 99 mg/dL 99  872  809   BUN 8 - 23 mg/dL 25  23  33   Creatinine 0.44 - 1.00 mg/dL 8.71  8.88  8.68   Sodium 135 - 145 mmol/L 132  141  132   Potassium 3.5 - 5.1 mmol/L 3.6  4.5  4.0   Chloride 98 - 111 mmol/L 106  106  98   CO2 22 - 32 mmol/L 22  20  19    Calcium  8.9 - 10.3 mg/dL 8.3  9.3  9.8   Total Protein 6.5 - 8.1 g/dL  6.3  8.1   Total Bilirubin 0.0 - 1.2 mg/dL  0.6  0.6   Alkaline Phos 38 - 126 U/L  42  53   AST 15 - 41 U/L  24  28   ALT 0 - 44 U/L  15  15    ABDOMINAL VISCERAL  Patient Name:  Jennifer Russo  Date of Exam:   05/23/2024 Medical Rec #: [id number]      Accession #:    [id number] Date of Birth: 03/08/1941      Patient Gender: F Patient Age:   29 years Exam Location:  [address] Procedure:      VAS US  MESENTERIC Referring Phys: [id number] DEBBY SAILOR Dajon Lazar   --------------------------------------------------------------------------- -----  Indications: Stenosis of inferior mesenteric artery  Performing Technologist: Duwaine Hives RVS    Examination Guidelines: A complete evaluation includes B-mode imaging, spectral Doppler, color Doppler, and power Doppler as needed of all accessible portions of each vessel. Bilateral testing is considered an integral part of a complete examination. Limited examinations for reoccurring indications may be performed as  noted.    Duplex Findings: +--------------------+--------+--------+------+------------------+ Mesenteric          PSV cm/sEDV cm/sPlaque     Comments      +--------------------+--------+--------+------+------------------+ Aorta Prox            172                                    +--------------------+--------+--------+------+------------------+ Aorta Mid               89                                    +--------------------+--------+--------+------+------------------+ Aorta Distal           82                                    +--------------------+--------+--------+------+------------------+ Celiac Artery Origin   50                 fed via collateral +--------------------+--------+--------+------+------------------+ SMA Origin            545                                    +--------------------+--------+--------+------+------------------+ SMA Proximal          457                                    +--------------------+--------+--------+------+------------------+ SMA Mid               191      34                            +--------------------+--------+--------+------+------------------+ SMA Distal            170      49                            +--------------------+--------+--------+------+------------------+ CHA                    74                                    +--------------------+--------+--------+------+------------------+ Splenic                54                                    +--------------------+--------+--------+------+------------------+ IMA                                         not visualized   +--------------------+--------+--------+------+------------------+            Summary: Mesenteric: 70 to 99% stenosis in the superior mesenteric artery. Celiac artery appears to be filled via SMA collaterals.   *See table(s) above for measurements  and observations.      Debby SAILOR. Magda, MD FACS [facility] Phone Number: [phone] 06/28/2024 4:41 PM   Total time spent on preparing this encounter including chart review, data review, collecting history, examining the patient, and coordinating care: 40 min  Portions of this report may have been transcribed using voice recognition software.  Every effort has  been made to ensure accuracy; however, inadvertent computerized transcription errors may still be present.

## 2024-06-28 NOTE — Progress Notes (Signed)
 [facility] [place]  ASSESSMENT / PLAN: 83 y.o. 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  Celiac artery occlusion.  Superior mesenteric artery stenosis.  IMA occlusion.   Recommend:  Abstinence from all tobacco products. Blood glucose control with goal A1c < 7%. Blood pressure control with goal blood pressure < 130/80 mmHg. Lipid reduction therapy with goal LDL-C < 55 mg/dL. Aspirin  81mg  by mouth daily. Atorvastatin 40-80mg  PO QD (or other high intensity statin therapy).   After evaluation by gastroenterology, the patient and their family are considering mesenteric artery intervention.  I agree with this approach and felt it reasonable to proceed.  The patient's daughter reports that they will likely move the patient and her husband to [place] to be closer to family.  She is not sure whether she would like to pursue intervention locally, or after they have settled in [place].  I counseled review there would probably be appropriate.  She would like some time to talk it over as a family and will call us  if they decide to proceed with superior mesenteric artery intervention here locally.  CHIEF COMPLAINT: Follow-up mesenteric stenosis  HISTORY OF PRESENT ILLNESS: Jennifer Russo is a 83 y.o. female who presents to the [facility] emergency department for evaluation of abdominal pain. Abdominal pain began yesterday afternoon. She has had a similar episode of severe abdominal pain about 10 years ago which required observation. Exploratory laparotomy was considered but given her resolution of symptoms, was not pursued. There is a bit of a language barrier in taking a history today. Her pastor's wife assists with translation. She does not report any food fear, postprandial pain, or unintentional weight loss. She has lost some weight after neurosurgery last fall, but thinks he has gained much of it back slowly. I counseled her about her CT angiogram findings.    05/23/24: Returns to clinic for surveillance.  Her daughter is with her today who acts as a nurse, learning disability.  Her daughter reports the patient has been experiencing weight loss.  She will have occasional postprandial nausea.  She has a reduced appetite.  She does not report typical postprandial pain, or food fear.  06/28/2024.  Returns after gastroenterology evaluation.  Gastroenterology physician assistant seemed to agree that chronic mesenteric ischemia was likely the cause of her symptoms.  She encouraged stenting.  We reviewed this in detail.  The patient's daughter reports to me that they are planning to move the patient and her husband to [place] to be closer to family.  She asked whether procedure should be performed here or in [place].  I counseled that either was appropriate as her symptoms are fairly stable.  She has gained some weight back.  She still has some occasional abdominal discomfort.  Past Medical History:  Diagnosis Date   Arthritis    Asymptomatic carotid artery stenosis    CAD (coronary artery disease)    s/p PCI- LAD and RCA with drug-eluting stents, PTCA in 2009 to treat in stent restenosis in RCA   Essential hypertension    HLD (hyperlipidemia)    Hypothyroidism    Ischemic colitis    around 2015   Macular degeneration    Stroke [facility])    when she lived in [place] (2000's)    Past Surgical History:  Procedure Laterality Date   CATARACT EXTRACTION     CORONARY ANGIOPLASTY  03-30-05 / 04-14-05   CORONARY STENT PLACEMENT     FEMORAL ARTERY REPAIR Right 07/03/2008   Groin  area   KNEE ARTHROSCOPY Left 11/09/2022   Procedure: Left knee arthroscopy; meniscal debridement;  Surgeon: Melodi Lerner, MD;  Location: [facility];  Service: Orthopedics;  Laterality: Left;   KNEE SURGERY  07/2016   THORACIC DISCECTOMY N/A 06/28/2023   Procedure: Laminectomy - Thoracic eleven-Thoracic twelve;  Surgeon: Louis Shove, MD;  Location: [facility] OR;  Service: Neurosurgery;  Laterality: N/A;     Family History  Problem Relation Age of Onset   Hypertension Mother    Diabetes Father     Social History   Socioeconomic History   Marital status: Married    Spouse name: Not on file   Number of children: 3   Years of education: Not on file   Highest education level: Not on file  Occupational History   Not on file  Tobacco Use   Smoking status: Never   Smokeless tobacco: Never  Vaping Use   Vaping status: Never Used  Substance and Sexual Activity   Alcohol  use: No    Alcohol /week: 0.0 standard drinks of alcohol    Drug use: No   Sexual activity: Not on file  Other Topics Concern   Not on file  Social History Narrative   Not on file   Social Drivers of Health   Financial Resource Strain: Not on file  Food Insecurity: No Food Insecurity (04/07/2024)   Hunger Vital Sign    Worried About Running Out of Food in the Last Year: Never true    Ran Out of Food in the Last Year: Never true  Transportation Needs: No Transportation Needs (04/07/2024)   PRAPARE - Transportation    Lack of Transportation (Medical): No    Lack of Transportation (Non-Medical): No  Physical Activity: Not on file  Stress: Not on file  Social Connections: Socially Integrated (04/07/2024)   Social Connection and Isolation Panel    Frequency of Communication with Friends and Family: More than three times a week    Frequency of Social Gatherings with Friends and Family: Once a week    Attends Religious Services: More than 4 times per year    Active Member of [employer] or Organizations: Yes    Attends Banker Meetings: 1 to 4 times per year    Marital Status: Married  Catering Manager Violence: Not At Risk (04/07/2024)   Humiliation, Afraid, Rape, and Kick questionnaire    Fear of Current or Ex-Partner: No    Emotionally Abused: No    Physically Abused: No    Sexually Abused: No    Allergies  Allergen Reactions   Atorvastatin     Elevated LFTs per [facility]   Penicillins Other (See  Comments)    Shaking episode per [facility]   Sulfonamide Derivatives Nausea And Vomiting    Per [facility]   Tramadol     Upset stomach Itching per [facility]    Current Outpatient Medications  Medication Sig Dispense Refill   amLODipine  (NORVASC ) 10 MG tablet Take 10 mg by mouth at bedtime.     Ascorbic Acid  (VITAMIN C ) 1000 MG tablet Take 1,000 mg by mouth daily.     Calcium  Carb-Cholecalciferol  (CALCIUM  600 + D PO) Take 1 tablet by mouth in the morning and at bedtime.     Cholecalciferol  (VITAMIN D ) 2000 UNITS CAPS Take 2,000 Units by mouth daily.     clopidogrel  (PLAVIX ) 75 MG tablet Take 75 mg by mouth daily.     levothyroxine  (SYNTHROID , LEVOTHROID) 50 MCG tablet Take 50 mcg by mouth daily before breakfast.  metoprolol  (LOPRESSOR ) 50 MG tablet Take 50 mg by mouth 2 (two) times daily.     Multiple Vitamins-Minerals (MULTIVITAL) tablet Take 1 tablet by mouth daily.     Omega-3 Fatty Acids (FISH OIL) 1000 MG CAPS Take 1,000 mg by mouth daily.     Polyethyl Glycol-Propyl Glycol (SYSTANE OP) Place 1 drop into both eyes 2 (two) times daily as needed (drye eyes).     rosuvastatin  (CRESTOR ) 20 MG tablet Take 20 mg by mouth daily.     spironolactone  (ALDACTONE ) 50 MG tablet Take 50 mg by mouth daily.     telmisartan -hydrochlorothiazide  (MICARDIS  HCT) 80-25 MG per tablet Take 1 tablet by mouth daily.     No current facility-administered medications for this visit.    PHYSICAL EXAM Vitals:   06/28/24 1530  BP: (!) 124/55  Pulse: (!) 52  Resp: 18  Temp: 97.6 F (36.4 C)  TempSrc: Temporal  SpO2: 99%  Weight: 111 lb 9.6 oz (50.6 kg)  Height: 5' 2 (1.575 m)   No acute distress Regular rate and rhythm Unlabored breathing Abdomen soft and nontender  PERTINENT LABORATORY AND RADIOLOGIC DATA  Most recent CBC    Latest Ref Rng & Units 04/08/2024    3:47 AM 04/07/2024    3:51 AM 04/06/2024    8:18 PM  CBC  WBC 4.0 - 10.5 K/uL 12.5  16.2  18.3   Hemoglobin 12.0 - 15.0 g/dL 9.5  89.6  88.4    Hematocrit 36.0 - 46.0 % 26.9  29.5  34.9   Platelets 150 - 400 K/uL 206  241  279      Most recent CMP    Latest Ref Rng & Units 04/08/2024    3:47 AM 04/07/2024    3:51 AM 04/06/2024    8:18 PM  CMP  Glucose 70 - 99 mg/dL 99  872  809   BUN 8 - 23 mg/dL 25  23  33   Creatinine 0.44 - 1.00 mg/dL 8.71  8.88  8.68   Sodium 135 - 145 mmol/L 132  141  132   Potassium 3.5 - 5.1 mmol/L 3.6  4.5  4.0   Chloride 98 - 111 mmol/L 106  106  98   CO2 22 - 32 mmol/L 22  20  19    Calcium  8.9 - 10.3 mg/dL 8.3  9.3  9.8   Total Protein 6.5 - 8.1 g/dL  6.3  8.1   Total Bilirubin 0.0 - 1.2 mg/dL  0.6  0.6   Alkaline Phos 38 - 126 U/L  42  53   AST 15 - 41 U/L  24  28   ALT 0 - 44 U/L  15  15    ABDOMINAL VISCERAL  Patient Name:  CHAUNTE HORNBECK  Date of Exam:   05/23/2024 Medical Rec #: [id number]      Accession #:    [id number] Date of Birth: 03/08/1941      Patient Gender: F Patient Age:   29 years Exam Location:  [address] Procedure:      VAS US  MESENTERIC Referring Phys: [id number] DEBBY SAILOR Dajon Lazar   --------------------------------------------------------------------------- -----  Indications: Stenosis of inferior mesenteric artery  Performing Technologist: Duwaine Hives RVS    Examination Guidelines: A complete evaluation includes B-mode imaging, spectral Doppler, color Doppler, and power Doppler as needed of all accessible portions of each vessel. Bilateral testing is considered an integral part of a complete examination. Limited examinations for reoccurring indications may be performed as  noted.    Duplex Findings: +--------------------+--------+--------+------+------------------+ Mesenteric          PSV cm/sEDV cm/sPlaque     Comments      +--------------------+--------+--------+------+------------------+ Aorta Prox            172                                    +--------------------+--------+--------+------+------------------+ Aorta Mid               89                                    +--------------------+--------+--------+------+------------------+ Aorta Distal           82                                    +--------------------+--------+--------+------+------------------+ Celiac Artery Origin   50                 fed via collateral +--------------------+--------+--------+------+------------------+ SMA Origin            545                                    +--------------------+--------+--------+------+------------------+ SMA Proximal          457                                    +--------------------+--------+--------+------+------------------+ SMA Mid               191      34                            +--------------------+--------+--------+------+------------------+ SMA Distal            170      49                            +--------------------+--------+--------+------+------------------+ CHA                    74                                    +--------------------+--------+--------+------+------------------+ Splenic                54                                    +--------------------+--------+--------+------+------------------+ IMA                                         not visualized   +--------------------+--------+--------+------+------------------+            Summary: Mesenteric: 70 to 99% stenosis in the superior mesenteric artery. Celiac artery appears to be filled via SMA collaterals.   *See table(s) above for measurements  and observations.      Debby SAILOR. Magda, MD FACS [facility] Phone Number: [phone] 06/28/2024 4:41 PM   Total time spent on preparing this encounter including chart review, data review, collecting history, examining the patient, and coordinating care: 40 min  Portions of this report may have been transcribed using voice recognition software.  Every effort has  been made to ensure accuracy; however, inadvertent computerized transcription errors may still be present.

## 2024-07-04 ENCOUNTER — Telehealth: Payer: Self-pay

## 2024-07-04 ENCOUNTER — Other Ambulatory Visit: Payer: Self-pay

## 2024-07-04 DIAGNOSIS — K551 Chronic vascular disorders of intestine: Secondary | ICD-10-CM

## 2024-07-04 NOTE — Telephone Encounter (Signed)
 Returned call to daughter-in-law, Rogerio, regarding scheduling of abdominal aortogram with mesenteric artery intervention.  Gave DIL some dates/times for Dr Magda.  She will call back after speaking to patient.

## 2024-07-18 ENCOUNTER — Ambulatory Visit: Admitting: Vascular Surgery

## 2024-07-21 ENCOUNTER — Ambulatory Visit (HOSPITAL_COMMUNITY)
Admission: RE | Admit: 2024-07-21 | Discharge: 2024-07-21 | Disposition: A | Attending: Vascular Surgery | Admitting: Vascular Surgery

## 2024-07-21 ENCOUNTER — Encounter (HOSPITAL_COMMUNITY): Admission: RE | Disposition: A | Payer: Self-pay | Source: Home / Self Care | Attending: Vascular Surgery

## 2024-07-21 ENCOUNTER — Other Ambulatory Visit: Payer: Self-pay

## 2024-07-21 ENCOUNTER — Telehealth: Payer: Self-pay

## 2024-07-21 DIAGNOSIS — K551 Chronic vascular disorders of intestine: Secondary | ICD-10-CM

## 2024-07-21 HISTORY — PX: VISCERAL ARTERY INTERVENTION: CATH118277

## 2024-07-21 HISTORY — PX: ABDOMINAL AORTOGRAM W/LOWER EXTREMITY: CATH118223

## 2024-07-21 LAB — POCT I-STAT, CHEM 8
BUN: 27 mg/dL — ABNORMAL HIGH (ref 8–23)
Calcium, Ion: 1.17 mmol/L (ref 1.15–1.40)
Chloride: 98 mmol/L (ref 98–111)
Creatinine, Ser: 1.4 mg/dL — ABNORMAL HIGH (ref 0.44–1.00)
Glucose, Bld: 79 mg/dL (ref 70–99)
HCT: 30 % — ABNORMAL LOW (ref 36.0–46.0)
Hemoglobin: 10.2 g/dL — ABNORMAL LOW (ref 12.0–15.0)
Potassium: 4.8 mmol/L (ref 3.5–5.1)
Sodium: 130 mmol/L — ABNORMAL LOW (ref 135–145)
TCO2: 21 mmol/L — ABNORMAL LOW (ref 22–32)

## 2024-07-21 SURGERY — ABDOMINAL AORTOGRAM W/LOWER EXTREMITY
Anesthesia: LOCAL

## 2024-07-21 MED ORDER — CLOPIDOGREL BISULFATE 75 MG PO TABS: 75.0000 mg | ORAL_TABLET | Freq: Every day | ORAL | Status: DC

## 2024-07-21 MED ORDER — LIDOCAINE HCL (PF) 1 % IJ SOLN
INTRAMUSCULAR | Status: DC | PRN
  Administered 2024-07-21: 10 mL

## 2024-07-21 MED ORDER — SODIUM CHLORIDE 0.9 % IV SOLN: 250.0000 mL | INTRAVENOUS | Status: DC | PRN

## 2024-07-21 MED ORDER — FENTANYL CITRATE (PF) 100 MCG/2ML IJ SOLN
INTRAMUSCULAR | Status: AC
  Filled 2024-07-21: qty 2

## 2024-07-21 MED ORDER — HEPARIN SODIUM (PORCINE) 1000 UNIT/ML IJ SOLN
INTRAMUSCULAR | Status: DC | PRN
  Administered 2024-07-21: 5000 [IU] via INTRAVENOUS

## 2024-07-21 MED ORDER — ACETAMINOPHEN 325 MG PO TABS: 650.0000 mg | ORAL_TABLET | ORAL | Status: DC | PRN

## 2024-07-21 MED ORDER — LIDOCAINE HCL (PF) 1 % IJ SOLN
INTRAMUSCULAR | Status: AC
  Filled 2024-07-21: qty 30

## 2024-07-21 MED ORDER — LABETALOL HCL 5 MG/ML IV SOLN: 10.0000 mg | INTRAVENOUS | Status: DC | PRN

## 2024-07-21 MED ORDER — ASPIRIN 81 MG PO CHEW
CHEWABLE_TABLET | ORAL | Status: DC | PRN
  Administered 2024-07-21: 81 mg via ORAL

## 2024-07-21 MED ORDER — SODIUM CHLORIDE 0.9% FLUSH: 3.0000 mL | INTRAVENOUS | Status: DC | PRN

## 2024-07-21 MED ORDER — HYDRALAZINE HCL 20 MG/ML IJ SOLN: 5.0000 mg | INTRAMUSCULAR | Status: DC | PRN

## 2024-07-21 MED ORDER — SODIUM CHLORIDE 0.9 % IV SOLN: INTRAVENOUS | Status: DC

## 2024-07-21 MED ORDER — HEPARIN SODIUM (PORCINE) 1000 UNIT/ML IJ SOLN
INTRAMUSCULAR | Status: AC
  Filled 2024-07-21: qty 10

## 2024-07-21 MED ORDER — ASPIRIN 81 MG PO TBEC: 81.0000 mg | DELAYED_RELEASE_TABLET | Freq: Every day | ORAL | Status: DC

## 2024-07-21 MED ORDER — IODIXANOL 320 MG/ML IV SOLN
INTRAVENOUS | Status: DC | PRN
  Administered 2024-07-21: 90 mL via INTRA_ARTERIAL

## 2024-07-21 MED ORDER — FENTANYL CITRATE (PF) 100 MCG/2ML IJ SOLN
INTRAMUSCULAR | Status: DC | PRN
  Administered 2024-07-21: 25 ug via INTRAVENOUS

## 2024-07-21 MED ORDER — SODIUM CHLORIDE 0.9 % WEIGHT BASED INFUSION: 1.0000 mL/kg/h | INTRAVENOUS | Status: DC

## 2024-07-21 MED ORDER — HEPARIN (PORCINE) IN NACL 1000-0.9 UT/500ML-% IV SOLN
INTRAVENOUS | Status: DC | PRN
  Administered 2024-07-21 (×2): 500 mL

## 2024-07-21 MED ORDER — MIDAZOLAM HCL 2 MG/2ML IJ SOLN
INTRAMUSCULAR | Status: AC
  Filled 2024-07-21: qty 2

## 2024-07-21 MED ORDER — ASPIRIN 81 MG PO CHEW
CHEWABLE_TABLET | ORAL | Status: AC
  Filled 2024-07-21: qty 1

## 2024-07-21 MED ORDER — MIDAZOLAM HCL (PF) 2 MG/2ML IJ SOLN
INTRAMUSCULAR | Status: DC | PRN
  Administered 2024-07-21: 1 mg via INTRAVENOUS

## 2024-07-21 MED ORDER — SODIUM CHLORIDE 0.9% FLUSH: 3.0000 mL | Freq: Two times a day (BID) | INTRAVENOUS | Status: DC

## 2024-07-21 MED ORDER — ONDANSETRON HCL 4 MG/2ML IJ SOLN: 4.0000 mg | Freq: Four times a day (QID) | INTRAMUSCULAR | Status: DC | PRN

## 2024-07-21 SURGICAL SUPPLY — 16 items
CATH CROSS OVER TEMPO 5F (CATHETERS) IMPLANT
CATH NAVICROSS ST 65CM (CATHETERS) IMPLANT
CATH OMNI FLUSH 5F 65CM (CATHETERS) IMPLANT
CLOSURE PERCLOSE PROSTYLE (Vascular Products) IMPLANT
DEVICE TORQUE .025-.038 (MISCELLANEOUS) IMPLANT
GUIDEWIRE ANGLED .035 180CM (WIRE) IMPLANT
KIT ENCORE 26 ADVANTAGE (KITS) IMPLANT
KIT MICROPUNCTURE NIT STIFF (SHEATH) IMPLANT
PACK CARDIAC CATHETERIZATION (CUSTOM PROCEDURE TRAY) IMPLANT
SET ATX-X65L (MISCELLANEOUS) IMPLANT
SHEATH FLEX ANSEL ANG 6F 45CM (SHEATH) IMPLANT
SHEATH PINNACLE 5F 10CM (SHEATH) IMPLANT
SHEATH PROBE COVER 6X72 (BAG) IMPLANT
STENT VIABAHN 7X29 6FR 80 (Permanent Stent) IMPLANT
WIRE BENTSON .035X145CM (WIRE) IMPLANT
WIRE ROSEN-J .035X260CM (WIRE) IMPLANT

## 2024-07-21 NOTE — Interval H&P Note (Signed)
 History and Physical Interval Note:  07/21/2024 11:37 AM  Jennifer Russo  has presented today for surgery, with the diagnosis of mesentric stenosis.  The various methods of treatment have been discussed with the patient and family. After consideration of risks, benefits and other options for treatment, the patient has consented to  Procedure(s): ABDOMINAL AORTOGRAM W/LOWER EXTREMITY (N/A) VISCERAL ARTERY INTERVENTION (N/A) as a surgical intervention.  The patient's history has been reviewed, patient examined, no change in status, stable for surgery.  I have reviewed the patient's chart and labs.  Questions were answered to the patient's satisfaction.     Debby LOISE Robertson

## 2024-07-21 NOTE — Telephone Encounter (Signed)
 Patient's son, Lamar, called concerned that patient's right groin incision bandage was soaked with blood upon returning home after pt's Abdominal Aortogram earlier today.  Lamar was not with his mom but was getting information from his brother, Charlie, who was with his mom.  Asked Lamar to have Richard call me.  Richard called and reported that he had changed the post-op bandage and replaced it was a thicker gauze and 2x2 bandage and that the incision was no longer bleeding.  Advised Richard to monitor the incision closely.   Advised that if the incision continues to bleed, apply firm, steady pressure to the incision. Richard knows to apply firm, steady pressure and call EMS if increased, rapid, pulsatile bleeding occurs.   Per Pt's AVS:  Get help right away if:  The incision area swells very fast.  The incision area is bleeding, and the bleeding does not stop when you hold steady pressure on the area. Your leg or foot becomes pale, cool, tingly, or numb.

## 2024-07-21 NOTE — Discharge Instructions (Signed)
 Femoral Site Care The following information offers guidance on how to care for yourself after your procedure. Your health care provider may also give you more specific instructions. If you have problems or questions, contact your health care provider. What can I expect after the procedure? After the procedure, it is common to have bruising and tenderness at the incision site. This usually fades within 1-2 weeks. Follow these instructions at home: Incision site care  Follow instructions from your health care provider about how to take care of your incision site. Make sure you: Wash your hands with soap and water for at least 20 seconds before and after you change your bandage (dressing). If soap and water are not available, use hand sanitizer. Remove your dressing in 24 hours. Leave stitches (sutures), skin glue, or adhesive strips in place. These skin closures may need to stay in place for 2 weeks or longer. If adhesive strip edges start to loosen and curl up, you may trim the loose edges. Do not remove adhesive strips completely unless your health care provider tells you to do that. Do not take baths, swim, or use a hot tub for at least 1 week. You may shower 24 hours after the procedure or as told by your health care provider. Gently wash the incision site with plain soap and water. Pat the area dry with a clean towel. Do not rub the site. This may cause bleeding. Do not apply powder or lotion to the site. Keep the site clean and dry. Check your femoral site every day for signs of infection. Check for: Redness, swelling, or pain. Fluid or blood. Warmth. Pus or a bad smell. Activity If you were given a sedative during the procedure, it can affect you for several hours. Do not drive or operate machinery until your health care provider says that it is safe. Rest as told by your health care provider. Avoid sitting for a long time without moving. Get up to take short walks every 1-2 hours. This  is important to improve blood flow and breathing. Ask for help if you feel weak or unsteady. Return to your normal activities as told by your health care provider. Ask your health care provider what activities are safe for you and when you can return to work. Avoid activities that take a lot of effort for the first 2-3 days after your procedure, or as long as directed. Do not lift anything that is heavier than 10 lb (4.5 kg), or the limit that you are told, until your health care provider says that it is safe. General instructions Take over-the-counter and prescription medicines only as told by your health care provider. If you will be going home right after the procedure, plan to have a responsible adult care for you for the time you are told. This is important. Keep all follow-up visits. This is important. Contact a health care provider if: You have a fever or chills. You have any of these signs of infection at your incision site: Redness, swelling, or pain. Fluid or blood. Warmth. Pus or a bad smell. Get help right away if: The incision area swells very fast. The incision area is bleeding, and the bleeding does not stop when you hold steady pressure on the area. Your leg or foot becomes pale, cool, tingly, or numb. These symptoms may represent a serious problem that is an emergency. Do not wait to see if the symptoms will go away. Get medical help right away. Call your local emergency  services (911 in the [place] Do not drive yourself to the hospital. Summary After the procedure, it is common to have bruising and tenderness that fade within 1-2 weeks. Check your femoral site every day for signs of infection. Do not lift anything that is heavier than 10 lb (4.5 kg), or the limit that you are told, until your health care provider says that it is safe. Get help right away if the incision area swells very fast, you have bleeding at the incision area that does not stop, or your leg or foot  becomes pale, cool, or numb. This information is not intended to replace advice given to you by your health care provider. Make sure you discuss any questions you have with your health care provider. Document Revised: 04/23/2021 Document Reviewed: 09/22/2020 Elsevier Patient Education  2024 [employer].

## 2024-07-21 NOTE — Op Note (Signed)
 DATE OF SERVICE: 07/21/2024  PATIENT:  Jennifer Russo  83 y.o. female  PRE-OPERATIVE DIAGNOSIS:  Chronic Mesenteric Ischemia  POST-OPERATIVE DIAGNOSIS:  Same  PROCEDURE:   1) Ultrasound guided right common femoral artery access (CPT [phone]) 2) Aortogram (CPT [phone]) 3) Selective superior mesenteric artery angiogram.  4) Superior mesenteric artery angioplasty and stenting (7x58mm VBX) 5) Conscious sedation (35 minutes) (CPT 99152)  SURGEON:  Debby SAILOR. Magda, MD  ASSISTANT: none  ANESTHESIA:   local and IV sedation  ESTIMATED BLOOD LOSS: min  LOCAL MEDICATIONS USED:  LIDOCAINE    COUNTS: confirmed correct.  PATIENT DISPOSITION:  PACU - hemodynamically stable.   Delay start of Pharmacological VTE agent (>24hrs) due to surgical blood loss or risk of bleeding: no  INDICATION FOR PROCEDURE: Jennifer Russo is a 83 y.o. 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 After careful discussion of risks, benefits, and alternatives the patient was offered SMA intervention. The patient understood and wished to proceed.  OPERATIVE FINDINGS:  Aortogram shows occluded celiac and inferior mesenteric arteries predicted by CT angiogram.  There is about 60% stenosis the origin of the superior mesenteric artery with a shelf of calcium .  Good result from angioplasty and stenting.  DESCRIPTION OF PROCEDURE: After identification of the patient in the pre-operative holding area, the patient was transferred to the operating room. The patient was positioned supine on the operating room table. Anesthesia was induced. The groins was prepped and draped in standard fashion. A surgical pause was performed confirming correct patient, procedure, and operative location.  The right groin was anesthetized with subcutaneous injection of 1% lidocaine . Using ultrasound guidance, the right common femoral artery was accessed with micropuncture technique. Fluoroscopy was used to confirm  cannulation over the femoral head. The 62F micropuncture sheath was upsized to 24F.   A Glidewire wire was advanced into the supraceliac aorta. Over the wire an omni flush catheter was advanced. Aortogram was performed - see above for details.   The superior mesenteric artery was selected with a crossover catheter and Glidewire guidewire. The wire was advanced into the distal superior mesenteric artery.  A Nava cross catheter was advanced over the wire into the distal SMA.  Selective angiogram was performed confirming accurate cannulation.    The decision was made to intervene. The patient was heparinized with 5000 units of heparin . The 24F sheath was exchanged for a 88F x45cm sheath.  This sheath was driven into the superior mesenteric artery.  A 7 x 29 mm VBX stent was positioned across the lesion and deployed to burst pressure (about 7.5 mm)  Completion angiography revealed:  Resolution of stenosis with improvement of flow into the superior mesenteric artery.  Close was used to close the arteriotomy. Hemostasis was excellent upon completion.   Conscious sedation was administered with the use of IV fentanyl  and midazolam  under continuous physician and nurse monitoring.  Heart rate, blood pressure, and oxygen saturation were continuously monitored.  Total sedation time was 35 minutes  Upon completion of the case instrument and sharps counts were confirmed correct. The patient was transferred to the PACU in good condition. I was present for all portions of the procedure.  PLAN: Aspirin , Plavix , statin therapies.  Follow-up with me in 4 weeks with mesenteric duplex.  Debby SAILOR. Magda, MD [facility] Phone Number: [phone] 07/21/2024 11:37 AM

## 2024-07-22 ENCOUNTER — Other Ambulatory Visit: Payer: Self-pay

## 2024-07-22 ENCOUNTER — Encounter (HOSPITAL_COMMUNITY): Payer: Self-pay | Admitting: *Deleted

## 2024-07-22 ENCOUNTER — Emergency Department (HOSPITAL_COMMUNITY)
Admission: EM | Admit: 2024-07-22 | Discharge: 2024-07-22 | Disposition: A | Discharge status: Home / Self Care | Attending: Emergency Medicine | Admitting: Emergency Medicine

## 2024-07-22 DIAGNOSIS — E039 Hypothyroidism, unspecified: Secondary | ICD-10-CM | POA: Insufficient documentation

## 2024-07-22 DIAGNOSIS — Z8673 Personal history of transient ischemic attack (TIA), and cerebral infarction without residual deficits: Secondary | ICD-10-CM | POA: Insufficient documentation

## 2024-07-22 DIAGNOSIS — R58 Hemorrhage, not elsewhere classified: Secondary | ICD-10-CM

## 2024-07-22 DIAGNOSIS — E871 Hypo-osmolality and hyponatremia: Secondary | ICD-10-CM | POA: Insufficient documentation

## 2024-07-22 DIAGNOSIS — K91840 Postprocedural hemorrhage and hematoma of a digestive system organ or structure following a digestive system procedure: Secondary | ICD-10-CM | POA: Insufficient documentation

## 2024-07-22 DIAGNOSIS — I2510 Atherosclerotic heart disease of native coronary artery without angina pectoris: Secondary | ICD-10-CM | POA: Insufficient documentation

## 2024-07-22 LAB — COMPREHENSIVE METABOLIC PANEL WITH GFR
ALT: 13 U/L (ref 0–44)
AST: 18 U/L (ref 15–41)
Albumin: 3 g/dL — ABNORMAL LOW (ref 3.5–5.0)
Alkaline Phosphatase: 40 U/L (ref 38–126)
Anion gap: 8 (ref 5–15)
BUN: 37 mg/dL — ABNORMAL HIGH (ref 8–23)
CO2: 20 mmol/L — ABNORMAL LOW (ref 22–32)
Calcium: 8.4 mg/dL — ABNORMAL LOW (ref 8.9–10.3)
Chloride: 99 mmol/L (ref 98–111)
Creatinine, Ser: 1.52 mg/dL — ABNORMAL HIGH (ref 0.44–1.00)
GFR, Estimated: 33 mL/min — ABNORMAL LOW (ref >60)
Glucose, Bld: 99 mg/dL (ref 70–99)
Potassium: 4.5 mmol/L (ref 3.5–5.1)
Sodium: 127 mmol/L — ABNORMAL LOW (ref 135–145)
Total Bilirubin: 0.3 mg/dL (ref 0.0–1.2)
Total Protein: 6.1 g/dL — ABNORMAL LOW (ref 6.5–8.1)

## 2024-07-22 LAB — CBC
HCT: 31 % — ABNORMAL LOW (ref 36.0–46.0)
Hemoglobin: 10.4 g/dL — ABNORMAL LOW (ref 12.0–15.0)
MCH: 31.4 pg (ref 26.0–34.0)
MCHC: 33.5 g/dL (ref 30.0–36.0)
MCV: 93.7 fL (ref 80.0–100.0)
Platelets: 332 K/uL (ref 150–400)
RBC: 3.31 MIL/uL — ABNORMAL LOW (ref 3.87–5.11)
RDW: 14.2 % (ref 11.5–15.5)
WBC: 9.1 K/uL (ref 4.0–10.5)
nRBC: 0 % (ref 0.0–0.2)

## 2024-07-22 LAB — SAMPLE TO BLOOD BANK

## 2024-07-22 MED ORDER — SODIUM CHLORIDE 0.9 % IV BOLUS
500.0000 mL | Freq: Once | INTRAVENOUS | Status: AC
  Administered 2024-07-22: 500 mL via INTRAVENOUS

## 2024-07-22 MED ORDER — LACTATED RINGERS IV BOLUS
1000.0000 mL | Freq: Once | INTRAVENOUS | Status: AC
  Administered 2024-07-22: 1000 mL via INTRAVENOUS

## 2024-07-22 MED ORDER — LIDOCAINE-EPINEPHRINE 1 %-1:100000 IJ SOLN
10.0000 mL | Freq: Once | INTRAMUSCULAR | Status: AC
  Administered 2024-07-22: 10 mL via INTRADERMAL
  Filled 2024-07-22: qty 1

## 2024-07-22 NOTE — ED Notes (Signed)
 Per Theadore, MD fluids started for MAP of 54. Son reports changing dressing 5x in the span of 10 hours until arrival. Upon site assessment, bandage/dressing was not soaking through. Site was oozing blood when bandaged lifted by Theadore, MD.

## 2024-07-22 NOTE — Discharge Instructions (Signed)
 You were evaluated in the Emergency Department and after careful evaluation, we did not find any emergent condition requiring admission or further testing in the hospital.  Your exam/testing today is overall reassuring.  Recommend close follow-up with your surgeon and primary care doctor to discuss your symptoms.  Please return to the Emergency Department if you experience any worsening of your condition.   Thank you for allowing us  to be a part of your care.

## 2024-07-22 NOTE — ED Triage Notes (Addendum)
 Patient's family report bleeding at patient's incision site this evening , abdominal artery stent placement yesterday . Advised by surgeon to go to ER for evaluation .

## 2024-07-22 NOTE — ED Notes (Addendum)
 The pts son reports that the incision has been bleeding since the surgery her dressing has been being changed every one hour incision rt groin

## 2024-07-22 NOTE — ED Notes (Signed)
 No bleeding from site right groin,

## 2024-07-22 NOTE — ED Notes (Signed)
Son back at bedside.

## 2024-07-22 NOTE — ED Notes (Signed)
 Son at bedside discharge instructions given

## 2024-07-22 NOTE — ED Provider Notes (Signed)
 [facility] Emergency Department Provider Note MRN:  [id number]  Arrival date & time: 07/22/24     Chief Complaint   Post Op Bleeding   History of Present Illness   Jennifer Russo is a 83 y.o. year-old female with a history of CAD presenting to the ED with chief complaint of postop bleeding.  Patient had a recent procedure for mesenteric ischemia with access site right femoral region.  Patient's son is here caring for the patient, he has been having to change the dressing on this area every hour, continues to slowly bleed.  Patient not having any other symptoms, no fever, no abdominal pain, no significant pain to the groin region.  Review of Systems  A thorough review of systems was obtained and all systems are negative except as noted in the HPI and PMH.   Patient's Health History    Past Medical History:  Diagnosis Date   Arthritis    Asymptomatic carotid artery stenosis    CAD (coronary artery disease)    s/p PCI- LAD and RCA with drug-eluting stents, PTCA in 2009 to treat in stent restenosis in RCA   Essential hypertension    HLD (hyperlipidemia)    Hypothyroidism    Ischemic colitis    around 2015   Macular degeneration    Stroke [facility])    when she lived in [place] (2000's)    Past Surgical History:  Procedure Laterality Date   CATARACT EXTRACTION     CORONARY ANGIOPLASTY  03-30-05 / 04-14-05   CORONARY STENT PLACEMENT     FEMORAL ARTERY REPAIR Right 07/03/2008   Groin area   KNEE ARTHROSCOPY Left 11/09/2022   Procedure: Left knee arthroscopy; meniscal debridement;  Surgeon: Melodi Lerner, MD;  Location: [facility] ORS;  Service: Orthopedics;  Laterality: Left;   KNEE SURGERY  07/2016   THORACIC DISCECTOMY N/A 06/28/2023   Procedure: Laminectomy - Thoracic eleven-Thoracic twelve;  Surgeon: Louis Shove, MD;  Location: [facility] OR;  Service: Neurosurgery;  Laterality: N/A;    Family History  Problem Relation Age of Onset   Hypertension Mother    Diabetes  Father     Social History   Socioeconomic History   Marital status: Married    Spouse name: Not on file   Number of children: 3   Years of education: Not on file   Highest education level: Not on file  Occupational History   Not on file  Tobacco Use   Smoking status: Never   Smokeless tobacco: Never  Vaping Use   Vaping status: Never Used  Substance and Sexual Activity   Alcohol  use: No    Alcohol /week: 0.0 standard drinks of alcohol    Drug use: No   Sexual activity: Not on file  Other Topics Concern   Not on file  Social History Narrative   Not on file   Social Drivers of Health   Financial Resource Strain: Not on file  Food Insecurity: No Food Insecurity (04/07/2024)   Hunger Vital Sign    Worried About Running Out of Food in the Last Year: Never true    Ran Out of Food in the Last Year: Never true  Transportation Needs: No Transportation Needs (04/07/2024)   PRAPARE - Transportation    Lack of Transportation (Medical): No    Lack of Transportation (Non-Medical): No  Physical Activity: Not on file  Stress: Not on file  Social Connections: Socially Integrated (04/07/2024)   Social Connection and Isolation Panel    Frequency of  Communication with Friends and Family: More than three times a week    Frequency of Social Gatherings with Friends and Family: Once a week    Attends Religious Services: More than 4 times per year    Active Member of [employer] or Organizations: Yes    Attends Banker Meetings: 1 to 4 times per year    Marital Status: Married  Catering Manager Violence: Not At Risk (04/07/2024)   Humiliation, Afraid, Rape, and Kick questionnaire    Fear of Current or Ex-Partner: No    Emotionally Abused: No    Physically Abused: No    Sexually Abused: No     Physical Exam   Vitals:   07/22/24 0530 07/22/24 0600  BP: (!) 113/43 (!) 112/40  Pulse: 60 64  Resp: 12 15  Temp:    SpO2: 100% 100%    CONSTITUTIONAL: Well-appearing,  NAD NEURO/PSYCH:  Alert and oriented x 3, no focal deficits EYES:  eyes equal and reactive ENT/NECK:  no LAD, no JVD CARDIO: Regular rate, well-perfused, normal S1 and S2 PULM:  CTAB no wheezing or rhonchi GI/GU:  non-distended, non-tender MSK/SPINE:  No gross deformities, no edema SKIN:  no rash, atraumatic   *Additional and/or pertinent findings included in MDM below  Diagnostic and Interventional Summary    EKG Interpretation Date/Time:    Ventricular Rate:    PR Interval:    QRS Duration:    QT Interval:    QTC Calculation:   R Axis:      Text Interpretation:         Labs Reviewed  COMPREHENSIVE METABOLIC PANEL WITH GFR - Abnormal; Notable for the following components:      Result Value   Sodium 127 (*)    CO2 20 (*)    BUN 37 (*)    Creatinine, Ser 1.52 (*)    Calcium  8.4 (*)    Total Protein 6.1 (*)    Albumin 3.0 (*)    GFR, Estimated 33 (*)    All other components within normal limits  CBC - Abnormal; Notable for the following components:   RBC 3.31 (*)    Hemoglobin 10.4 (*)    HCT 31.0 (*)    All other components within normal limits  SAMPLE TO BLOOD BANK    No orders to display    Medications  lidocaine -EPINEPHrine  (XYLOCAINE  W/EPI) 1 %-1:100000 (with pres) injection 10 mL (10 mLs Intradermal Given 07/22/24 0400)  sodium chloride  0.9 % bolus 500 mL (0 mLs Intravenous Stopped 07/22/24 0424)  lactated ringers  bolus 1,000 mL (1,000 mLs Intravenous New Bag/Given 07/22/24 0424)     Procedures  /  Critical Care .Laceration Repair  Date/Time: 07/22/2024 6:34 AM  Performed by: Theadore Ozell HERO, MD Authorized by: Theadore Ozell HERO, MD   Consent:    Consent obtained:  Verbal   Consent given by:  Patient and parent   Risks, benefits, and alternatives were discussed: yes     Risks discussed:  Infection, need for additional repair, nerve damage, poor wound healing, poor cosmetic result, pain, retained foreign body, tendon damage and vascular damage    Alternatives discussed:  No treatment Universal protocol:    Procedure explained and questions answered to patient or proxy's satisfaction: yes     Immediately prior to procedure, a time out was called: yes     Patient identity confirmed:  Verbally with patient Anesthesia:    Anesthesia method:  Local infiltration   Local anesthetic:  Lidocaine  1%  w/o epi Laceration details:    Location: Right groin.   Length (cm):  1   Depth (mm):  1 Exploration:    Limited defect created (wound extended): no     Hemostasis achieved with:  Direct pressure   Wound exploration: wound explored through full range of motion and entire depth of wound visualized     Contaminated: no   Treatment:    Amount of cleaning:  Standard Skin repair:    Repair method:  Sutures and tissue adhesive   Suture size:  4-0   Suture material:  Prolene   Suture technique:  Simple interrupted   Number of sutures:  2 Approximation:    Approximation:  Close Repair type:    Repair type:  Simple Post-procedure details:    Dressing:  Non-adherent dressing   Procedure completion:  Tolerated well, no immediate complications   ED Course and Medical Decision Making  Initial Impression and Ddx Right groin region is a femoral access site, small hole in the skin that is slowly bleeding.  There is no significant bruising, no hematoma in this region to suggest significant arterial bleeding or pseudoaneurysm.  Case discussed with Dr. Gretta of vascular surgery, given the description of the exam superficial venous bleeding is most likely.  Plan is for suture and Dermabond repair and reassess.  Past medical/surgical history that increases complexity of ED encounter: CAD, mesenteric ischemia  Interpretation of Diagnostics I personally reviewed the Laboratory Testing and my interpretation is as follows: Hyponatremia noted    Patient Reassessment and Ultimate Disposition/Management     Patient observed with continued hemostasis of  the post procedure site.  Had some soft blood pressures but seemed to respond to some IV fluids.  Observed for several hours with very stable blood pressures, patient is warm and well-perfused and without any symptoms, no abdominal pain, continues to have a generally normal-appearing hemostatic procedure site with no hematoma.  Discharged with return precautions.  Patient management required discussion with the following services or consulting groups: Vascular surgery  Complexity of Problems Addressed Acute illness or injury that poses threat of life of bodily function  Additional Data Reviewed and Analyzed Further history obtained from: Further history from spouse/family member  Additional Factors Impacting ED Encounter Risk Consideration of hospitalization  Ozell HERO. Theadore, MD [facility] Emergency Medicine [facility] [email or web address]  Final Clinical Impressions(s) / ED Diagnoses     ICD-10-CM   1. Bleeding  R58       ED Discharge Orders     None        Discharge Instructions Discussed with and Provided to Patient:     Discharge Instructions      You were evaluated in the Emergency Department and after careful evaluation, we did not find any emergent condition requiring admission or further testing in the hospital.  Your exam/testing today is overall reassuring.  Recommend close follow-up with your surgeon and primary care doctor to discuss your symptoms.  Please return to the Emergency Department if you experience any worsening of your condition.   Thank you for allowing us  to be a part of your care.       Theadore Ozell HERO, MD 07/22/24 [phone]

## 2024-07-23 ENCOUNTER — Encounter: Payer: Self-pay | Admitting: Vascular Surgery

## 2024-07-24 ENCOUNTER — Encounter (HOSPITAL_COMMUNITY): Payer: Self-pay | Admitting: Vascular Surgery

## 2024-07-26 ENCOUNTER — Other Ambulatory Visit: Payer: Self-pay | Admitting: Vascular Surgery

## 2024-07-26 DIAGNOSIS — K551 Chronic vascular disorders of intestine: Secondary | ICD-10-CM

## 2024-08-21 ENCOUNTER — Ambulatory Visit

## 2024-08-22 ENCOUNTER — Ambulatory Visit (HOSPITAL_COMMUNITY)
Admission: RE | Admit: 2024-08-22 | Discharge: 2024-08-22 | Disposition: A | Discharge status: Home / Self Care | Source: Ambulatory Visit | Attending: Surgery | Admitting: Surgery

## 2024-08-22 ENCOUNTER — Ambulatory Visit: Admitting: Physician Assistant

## 2024-08-22 VITALS — BP 155/66 | HR 67 | Ht 62.0 in | Wt 110.6 lb

## 2024-08-22 DIAGNOSIS — K551 Chronic vascular disorders of intestine: Secondary | ICD-10-CM

## 2024-08-22 NOTE — Progress Notes (Signed)
 " Office Note     CC:  follow up Requesting Provider:  Clarice Nottingham, MD  HPI: Jennifer Russo is a 84 y.o. ([birth year]) female who presents status post aortogram with SMA angioplasty and stenting by Dr. Magda on 07/21/2024 due to chronic mesenteric ischemia.  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  She denies any current abdominal pain.  She also denies any postprandial pain.  She returned to the emergency department the following day due to ongoing bleeding from right groin access site.  This was treated with a suture and Dermabond.  She believes the right groin has since healed.  She is on aspirin  and Plavix  daily.  She is accompanied today by her daughter.  She is planning to move closer to her daughter in the [place] area later this month.   Past Medical History:  Diagnosis Date   Arthritis    Asymptomatic carotid artery stenosis    CAD (coronary artery disease)    s/p PCI- LAD and RCA with drug-eluting stents, PTCA in 2009 to treat in stent restenosis in RCA   Essential hypertension    HLD (hyperlipidemia)    Hypothyroidism    Ischemic colitis    around 2015   Macular degeneration    Stroke [facility])    when she lived in [place] (2000's)    Past Surgical History:  Procedure Laterality Date   ABDOMINAL AORTOGRAM W/LOWER EXTREMITY N/A 07/21/2024   Procedure: ABDOMINAL AORTOGRAM W/LOWER EXTREMITY;  Surgeon: Magda Debby SAILOR, MD;  Location: [facility];  Service: Cardiovascular;  Laterality: N/A;   CATARACT EXTRACTION     CORONARY ANGIOPLASTY  03-30-05 / 04-14-05   CORONARY STENT PLACEMENT     FEMORAL ARTERY REPAIR Right 07/03/2008   Groin area   KNEE ARTHROSCOPY Left 11/09/2022   Procedure: Left knee arthroscopy; meniscal debridement;  Surgeon: Melodi Lerner, MD;  Location: [facility] OR[facility];  Service: Orthopedics;  Laterality: Left;   KNEE SURGERY  07/2016   THORACIC DISCECTOMY N/A 06/28/2023   Procedure: Laminectomy - Thoracic eleven-Thoracic twelve;  Surgeon: Louis Shove, MD;   Location: [facility] OR;  Service: Neurosurgery;  Laterality: N/A;   VISCERAL ARTERY INTERVENTION N/A 07/21/2024   Procedure: VISCERAL ARTERY INTERVENTION;  Surgeon: Magda Debby SAILOR, MD;  Location: [facility];  Service: Cardiovascular;  Laterality: N/A;    Social History   Socioeconomic History   Marital status: Married    Spouse name: Not on file   Number of children: 3   Years of education: Not on file   Highest education level: Not on file  Occupational History   Not on file  Tobacco Use   Smoking status: Never   Smokeless tobacco: Never  Vaping Use   Vaping status: Never Used  Substance and Sexual Activity   Alcohol  use: No    Alcohol /week: 0.0 standard drinks of alcohol    Drug use: No   Sexual activity: Not on file  Other Topics Concern   Not on file  Social History Narrative   Not on file   Social Drivers of Health   Tobacco Use: Low Risk (07/22/2024)   Patient History    Smoking Tobacco Use: Never    Smokeless Tobacco Use: Never    Passive Exposure: Not on file  Financial Resource Strain: Not on file  Food Insecurity: No Food Insecurity (04/07/2024)   Epic    Worried About Radiation Protection Practitioner of Food in the Last Year: Never true    Ran Out  of Food in the Last Year: Never true  Transportation Needs: No Transportation Needs (04/07/2024)   Epic    Lack of Transportation (Medical): No    Lack of Transportation (Non-Medical): No  Physical Activity: Not on file  Stress: Not on file  Social Connections: Socially Integrated (04/07/2024)   Social Connection and Isolation Panel    Frequency of Communication with Friends and Family: More than three times a week    Frequency of Social Gatherings with Friends and Family: Once a week    Attends Religious Services: More than 4 times per year    Active Member of [employer] or Organizations: Yes    Attends Banker Meetings: 1 to 4 times per year    Marital Status: Married  Catering Manager Violence: Not At Risk (04/07/2024)    Epic    Fear of Current or Ex-Partner: No    Emotionally Abused: No    Physically Abused: No    Sexually Abused: No  Depression (PHQ2-9): Not on file  Alcohol  Screen: Not on file  Housing: Low Risk (04/07/2024)   Epic    Unable to Pay for Housing in the Last Year: No    Number of Times Moved in the Last Year: 0    Homeless in the Last Year: No  Utilities: Not At Risk (04/07/2024)   Epic    Threatened with loss of utilities: No  Health Literacy: Not on file    Family History  Problem Relation Age of Onset   Hypertension Mother    Diabetes Father     Current Outpatient Medications  Medication Sig Dispense Refill   Ascorbic Acid  (VITAMIN C ) 1000 MG tablet Take 1,000 mg by mouth daily.     aspirin  EC 81 MG tablet Take 81 mg by mouth daily. Swallow whole.     Calcium  Carb-Cholecalciferol  (CALCIUM  600 + D PO) Take 1 tablet by mouth in the morning and at bedtime.     Cholecalciferol  (VITAMIN D ) 2000 UNITS CAPS Take 2,000 Units by mouth daily.     clopidogrel  (PLAVIX ) 75 MG tablet Take 75 mg by mouth daily.     levothyroxine  (SYNTHROID , LEVOTHROID) 50 MCG tablet Take 50 mcg by mouth daily before breakfast.     metoprolol  (LOPRESSOR ) 50 MG tablet Take 50 mg by mouth 2 (two) times daily.     Multiple Vitamins-Minerals (MULTIVITAL) tablet Take 1 tablet by mouth daily.     Omega-3 Fatty Acids (FISH OIL) 1000 MG CAPS Take 1,000 mg by mouth daily.     rosuvastatin  (CRESTOR ) 20 MG tablet Take 20 mg by mouth daily.     spironolactone  (ALDACTONE ) 50 MG tablet Take 50 mg by mouth daily.     vitamin B-12 (CYANOCOBALAMIN) 100 MCG tablet Take 100 mcg by mouth daily.     amLODipine  (NORVASC ) 10 MG tablet Take 10 mg by mouth at bedtime. (Patient not taking: Reported on 08/22/2024)     telmisartan -hydrochlorothiazide  (MICARDIS  HCT) 80-25 MG per tablet Take 1 tablet by mouth daily. (Patient not taking: Reported on 08/22/2024)     No current facility-administered medications for this visit.     Allergies[1]   REVIEW OF SYSTEMS:  Negative unless noted in HPI [X]  denotes positive finding, [ ]  denotes negative finding Cardiac  Comments:  Chest pain or chest pressure:    Shortness of breath upon exertion:    Short of breath when lying flat:    Irregular heart rhythm:        Vascular  Pain in calf, thigh, or hip brought on by ambulation:    Pain in feet at night that wakes you up from your sleep:     Blood clot in your veins:    Leg swelling:         Pulmonary    Oxygen at home:    Productive cough:     Wheezing:         Neurologic    Sudden weakness in arms or legs:     Sudden numbness in arms or legs:     Sudden onset of difficulty speaking or slurred speech:    Temporary loss of vision in one eye:     Problems with dizziness:         Gastrointestinal    Blood in stool:     Vomited blood:         Genitourinary    Burning when urinating:     Blood in urine:        Psychiatric    Major depression:         Hematologic    Bleeding problems:    Problems with blood clotting too easily:        Skin    Rashes or ulcers:        Constitutional    Fever or chills:      PHYSICAL EXAMINATION:  Vitals:   08/22/24 1245 08/22/24 1329  BP: (!) 193/70 (!) 155/66  Pulse: 67   Weight: 110 lb 9.6 oz (50.2 kg)   Height: 5' 2 (1.575 m)     General:  WDWN in NAD; vital signs documented above Gait: Not observed HENT: WNL, normocephalic Pulmonary: normal non-labored breathing Cardiac: regular HR Abdomen: soft, NT, no masses Skin: without rashes Vascular Exam/Pulses: palpable R PT and L ATA Extremities: without ischemic changes, without Gangrene , without cellulitis; without open wounds; right groin without hematoma; access site healed Musculoskeletal: no muscle wasting or atrophy  Neurologic: A&O X 3 Psychiatric:  The pt has Normal affect.   Non-Invasive Vascular Imaging:   SMA stent widely patent on duplex    ASSESSMENT/PLAN:: 84 y.o. female  status post aortogram with SMA angioplasty and stenting due to chronic mesenteric ischemia  Jennifer Russo is an 84 year old female who underwent SMA angioplasty and stenting.  She has known occlusion of the celiac and IMA.  Duplex demonstrates a widely patent SMA stent.  She denies any abdominal pain or postprandial pain.  Right groin access site completely healed.  The suture was removed in the office today.  She will continue dual antiplatelet therapy with aspirin  and Plavix .  She is planning to move closer to her daughter in the [place] area later this month.  Plan would be to repeat mesenteric artery duplex in 6 months.  Her daughter plans to establish care with a new vascular surgeon in the [place] area based on Dr. Cleda recommendations.   Jennifer Sender, PA-C [facility] [phone]  Clinic MD:   Magda     [1]  Allergies Allergen Reactions   Atorvastatin     Elevated LFTs per [facility]   Penicillins Other (See Comments)    Shaking episode per [facility]   Sulfonamide Derivatives Nausea And Vomiting    Per [facility]   Tramadol     Upset stomach Itching per [facility]   "

## 2025-05-08 ENCOUNTER — Ambulatory Visit (HOSPITAL_COMMUNITY)
